# Patient Record
Sex: Female | Born: 1985
Health system: Southern US, Community
[De-identification: ages and names within clinical notes are randomized; demographics above are authoritative.]

## PROBLEM LIST (undated history)

## (undated) ENCOUNTER — Inpatient Hospital Stay (HOSPITAL_COMMUNITY): Payer: Self-pay

## (undated) DIAGNOSIS — O24419 Gestational diabetes mellitus in pregnancy, unspecified control: Secondary | ICD-10-CM

## (undated) DIAGNOSIS — I10 Essential (primary) hypertension: Secondary | ICD-10-CM

## (undated) DIAGNOSIS — F329 Major depressive disorder, single episode, unspecified: Secondary | ICD-10-CM

## (undated) DIAGNOSIS — O9989 Other specified diseases and conditions complicating pregnancy, childbirth and the puerperium: Secondary | ICD-10-CM

## (undated) DIAGNOSIS — O2240 Hemorrhoids in pregnancy, unspecified trimester: Secondary | ICD-10-CM

## (undated) DIAGNOSIS — R945 Abnormal results of liver function studies: Secondary | ICD-10-CM

## (undated) DIAGNOSIS — O99891 Other specified diseases and conditions complicating pregnancy: Secondary | ICD-10-CM

## (undated) DIAGNOSIS — Z8659 Personal history of other mental and behavioral disorders: Secondary | ICD-10-CM

## (undated) HISTORY — DX: Other specified diseases and conditions complicating pregnancy: O99.891

## (undated) HISTORY — DX: Gestational diabetes mellitus in pregnancy, unspecified control: O24.419

## (undated) HISTORY — PX: NO PAST SURGERIES: SHX2092

## (undated) HISTORY — DX: Other specified diseases and conditions complicating pregnancy, childbirth and the puerperium: O99.89

## (undated) HISTORY — DX: Abnormal results of liver function studies: R94.5

## (undated) HISTORY — DX: Hemorrhoids in pregnancy, unspecified trimester: O22.40

## (undated) HISTORY — DX: Essential (primary) hypertension: I10

## (undated) HISTORY — DX: Personal history of other mental and behavioral disorders: Z86.59

---

## 2003-05-21 ENCOUNTER — Emergency Department (HOSPITAL_COMMUNITY): Admission: EM | Admit: 2003-05-21 | Discharge: 2003-05-21 | Payer: Self-pay | Admitting: Emergency Medicine

## 2003-12-27 LAB — HEMOGLOBIN EVAL RFX ELECTROPHORESIS: Hemoglobin Evaluation: NORMAL

## 2003-12-29 ENCOUNTER — Ambulatory Visit (HOSPITAL_COMMUNITY): Admission: RE | Admit: 2003-12-29 | Discharge: 2003-12-29 | Payer: Self-pay | Admitting: *Deleted

## 2004-01-10 ENCOUNTER — Inpatient Hospital Stay (HOSPITAL_COMMUNITY): Admission: AD | Admit: 2004-01-10 | Discharge: 2004-01-10 | Payer: Self-pay | Admitting: *Deleted

## 2004-05-02 ENCOUNTER — Encounter: Admission: RE | Admit: 2004-05-02 | Discharge: 2004-05-02 | Payer: Self-pay | Admitting: *Deleted

## 2004-05-10 ENCOUNTER — Encounter: Admission: RE | Admit: 2004-05-10 | Discharge: 2004-05-10 | Payer: Self-pay | Admitting: *Deleted

## 2004-05-13 ENCOUNTER — Inpatient Hospital Stay (HOSPITAL_COMMUNITY): Admission: AD | Admit: 2004-05-13 | Discharge: 2004-05-16 | Payer: Self-pay | Admitting: Family Medicine

## 2007-02-08 ENCOUNTER — Emergency Department (HOSPITAL_COMMUNITY): Admission: EM | Admit: 2007-02-08 | Discharge: 2007-02-08 | Payer: Self-pay | Admitting: Emergency Medicine

## 2009-04-15 ENCOUNTER — Emergency Department (HOSPITAL_COMMUNITY): Admission: EM | Admit: 2009-04-15 | Discharge: 2009-04-15 | Payer: Self-pay | Admitting: Emergency Medicine

## 2011-02-04 LAB — URINALYSIS, ROUTINE W REFLEX MICROSCOPIC
Nitrite: NEGATIVE
Protein, ur: NEGATIVE mg/dL
pH: 6.5 (ref 5.0–8.0)

## 2011-02-04 LAB — POCT I-STAT, CHEM 8
BUN: 11 mg/dL (ref 6–23)
Calcium, Ion: 1.17 mmol/L (ref 1.12–1.32)
Chloride: 107 mEq/L (ref 96–112)
Glucose, Bld: 98 mg/dL (ref 70–99)
TCO2: 23 mmol/L (ref 0–100)

## 2011-02-04 LAB — URINE MICROSCOPIC-ADD ON

## 2011-02-04 LAB — POCT CARDIAC MARKERS: CKMB, poc: <1 ng/mL — ABNORMAL LOW (ref 1.0–8.0)

## 2011-02-21 ENCOUNTER — Ambulatory Visit
Admission: RE | Admit: 2011-02-21 | Discharge: 2011-02-21 | Disposition: A | Discharge status: Home / Self Care | Payer: No Typology Code available for payment source | Source: Ambulatory Visit | Attending: Geriatric Medicine | Admitting: Geriatric Medicine

## 2011-02-21 ENCOUNTER — Other Ambulatory Visit: Payer: Self-pay | Admitting: Geriatric Medicine

## 2011-02-21 DIAGNOSIS — M79641 Pain in right hand: Secondary | ICD-10-CM

## 2011-09-10 ENCOUNTER — Other Ambulatory Visit (HOSPITAL_COMMUNITY): Payer: Self-pay | Admitting: Family

## 2011-09-10 DIAGNOSIS — Z3689 Encounter for other specified antenatal screening: Secondary | ICD-10-CM

## 2011-09-10 LAB — GC/CHLAMYDIA PROBE AMP, GENITAL
Chlamydia: NEGATIVE
Gonorrhea: NEGATIVE

## 2011-09-10 LAB — CBC: HCT: 33 % — AB (ref 36–46)

## 2011-09-10 LAB — RPR: RPR: NONREACTIVE

## 2011-09-10 LAB — GLUCOSE TOLERANCE, 1 HOUR
GTT, 1 hr: 82
Urine Culture, OB: NO GROWTH

## 2011-09-10 LAB — RUBELLA ANTIBODY, IGM: Rubella: IMMUNE

## 2011-09-10 LAB — HEPATITIS B SURFACE ANTIGEN: Hepatitis B Surface Ag: NEGATIVE

## 2011-09-12 ENCOUNTER — Ambulatory Visit (HOSPITAL_COMMUNITY)
Admission: RE | Admit: 2011-09-12 | Discharge: 2011-09-12 | Disposition: A | Discharge status: Home / Self Care | Payer: Medicaid Other | Source: Ambulatory Visit | Attending: Family | Admitting: Family

## 2011-09-12 ENCOUNTER — Encounter (HOSPITAL_COMMUNITY): Payer: Self-pay

## 2011-09-12 DIAGNOSIS — O358XX0 Maternal care for other (suspected) fetal abnormality and damage, not applicable or unspecified: Secondary | ICD-10-CM | POA: Insufficient documentation

## 2011-09-12 DIAGNOSIS — Z1389 Encounter for screening for other disorder: Secondary | ICD-10-CM | POA: Insufficient documentation

## 2011-09-12 DIAGNOSIS — Z3689 Encounter for other specified antenatal screening: Secondary | ICD-10-CM

## 2011-09-12 DIAGNOSIS — Z363 Encounter for antenatal screening for malformations: Secondary | ICD-10-CM | POA: Insufficient documentation

## 2011-09-12 DIAGNOSIS — O10019 Pre-existing essential hypertension complicating pregnancy, unspecified trimester: Secondary | ICD-10-CM | POA: Insufficient documentation

## 2011-09-12 DIAGNOSIS — O0930 Supervision of pregnancy with insufficient antenatal care, unspecified trimester: Secondary | ICD-10-CM | POA: Insufficient documentation

## 2011-09-13 DIAGNOSIS — I10 Essential (primary) hypertension: Secondary | ICD-10-CM | POA: Insufficient documentation

## 2011-09-16 ENCOUNTER — Other Ambulatory Visit: Payer: Self-pay | Admitting: Obstetrics & Gynecology

## 2011-09-16 ENCOUNTER — Encounter: Payer: Self-pay | Admitting: Family Medicine

## 2011-09-16 ENCOUNTER — Ambulatory Visit (INDEPENDENT_AMBULATORY_CARE_PROVIDER_SITE_OTHER): Payer: Self-pay | Admitting: Family Medicine

## 2011-09-16 DIAGNOSIS — O0990 Supervision of high risk pregnancy, unspecified, unspecified trimester: Secondary | ICD-10-CM | POA: Insufficient documentation

## 2011-09-16 DIAGNOSIS — O169 Unspecified maternal hypertension, unspecified trimester: Secondary | ICD-10-CM

## 2011-09-16 DIAGNOSIS — Z23 Encounter for immunization: Secondary | ICD-10-CM

## 2011-09-16 DIAGNOSIS — O10019 Pre-existing essential hypertension complicating pregnancy, unspecified trimester: Secondary | ICD-10-CM

## 2011-09-16 DIAGNOSIS — I10 Essential (primary) hypertension: Secondary | ICD-10-CM

## 2011-09-16 LAB — POCT URINALYSIS DIP (DEVICE)
Glucose, UA: NEGATIVE mg/dL
Ketones, ur: NEGATIVE mg/dL
Nitrite: NEGATIVE

## 2011-09-16 MED ORDER — METHYLDOPA 500 MG PO TABS: 250.0000 mg | ORAL_TABLET | Freq: Two times a day (BID) | ORAL | Status: DC

## 2011-09-16 MED ORDER — TETANUS-DIPHTH-ACELL PERTUSSIS 5-2.5-18.5 LF-MCG/0.5 IM SUSP
0.5000 mL | Freq: Once | INTRAMUSCULAR | Status: AC
  Administered 2011-09-16: 0.5 mL via INTRAMUSCULAR

## 2011-09-16 MED ORDER — INFLUENZA VIRUS VACC SPLIT PF IM SUSP
0.5000 mL | Freq: Once | INTRAMUSCULAR | Status: AC
  Administered 2011-09-16: 0.5 mL via INTRAMUSCULAR

## 2011-09-16 NOTE — Patient Instructions (Addendum)
Hipertensin en el embarazo (Hypertension in Pregnancy) Hipertensin significa presin arterial elevada. La presin arterial hace circular la sangre por el organismo. En algunos casos, la fuerza que [facility] sangre se hace muy intensa. Durante el [place], este problema debe controlarse con mucha atencin. CUIDADOS EN EL HOGAR   Siga estrictamente las indicaciones del profesional.   Tome slo la medicacin que le indic el mdico.  SOLICITE AYUDA DE INMEDIATO SI:  Siente dolor en el vientre (abdominal).   Observa hinchazn repentina de las manos, tobillos o el rostro.   Aumenta ms de 4 libras (1.8 kilogramos) en una semana.   Vomita repetidas veces.   Tiene hemorragia vaginal.   No siente los movimientos del beb.   Le duele la cabeza.   Tiene visin borrosa o doble.   Tiene tics o espasmos musculares.   Le falta el aire.   Las uas y los labios estn [place].   Observa sangre en la orina.  ASEGRESE DE QUE:  Comprende estas instrucciones.   Controlar su enfermedad.   Solicitar ayuda de inmediato si no mejora o empeora.  Document Released: 01/29/2011 [facility] Patient Information 2012 [place], [place]. Embarazo - Segundo trimestre (Pregnancy - Second Trimester) El segundo trimestre del Psychiatrist (del 3 al ) es un perodo de evolucin rpida para usted y el beb. Hacia el final del sexto mes, el beb mide aproximadamente 23 cm y pesa 680 g. Comenzar a Pharmacologist del beb [place] 18 y las 20 [address] de [place]. Podr sentir las pataditas ("quickening en ingls"). Hay un rpido [employer]. Puede segregar un lquido claro Charity fundraiser) de las [place]. Quizs sienta pequeas contracciones en el vientre (tero) Esto se conoce como falso trabajo de parto o contracciones de Bra[place]. Es como una prctica del trabajo de parto que se produce cuando el beb est listo para salir. Generalmente los problemas de vmitos matinales ya se han superado hacia el  final del Medical sales representative. Algunas mujeres desarrollan pequeas manchas oscuras (que se denominan cloasma, mscara del embarazo) en la cara que normalmente se van luego del nacimiento del beb. La exposicin al sol empeora las manchas. Puede desarrollarse acn en algunas mujeres embarazadas, y puede desaparecer en aquellas que ya tienen acn. EXAMENES PRENATALES  Durante los [employer], deber seguir realizando pruebas de [place], segn avance el [place]. Estas pruebas se realizan para controlar su salud y la del beb. Tambin se realizan anlisis de sangre para [employer] niveles de [place]. La anemia (bajo nivel de hemoglobina) es frecuente durante el embarazo. Para prevenirla, se administran hierro y vitaminas. Tambin se le realizarn exmenes para saber si tiene diabetes entre las 24 y las 28 semanas del [place]. Podrn repetirle algunas de las [employer] hicieron previamente.   En cada visita le medirn el tamao del tero. Esto se realiza para asegurarse de que el beb est creciendo correctamente de acuerdo al estado del [place].   Tambin en cada visita prenatal controlarn su presin arterial. Esto se realiza para asegurarse de que no tenga toxemia.   Se controlar su orina para asegurarse de que no tenga infecciones, diabetes o protena en la orina.   Se controlar su peso regularmente para asegurarse que el aumento ocurre al ritmo indicado. Esto se hace para asegurarse que usted y el beb tienen una evolucin normal.   En algunas ocasiones se realiza una prueba de ultrasonido para confirmar el correcto desarrollo y evolucin del beb. Esta prueba se realiza con ondas sonoras inofensivas para  el beb, de modo que el profesional pueda calcular ms precisamente la fecha del [place].  Algunas veces se realizan pruebas especializadas del lquido amnitico que rodea al beb. Esta prueba se denomina amniocentesis. El lquido amnitico se obtiene introduciendo una aguja en el  vientre (abdomen). Se realiza para Conservator, museum/gallery en los que existe alguna preocupacin acerca de algn problema gentico que pueda sufrir el beb. En ocasiones se lleva a cabo cerca del final del embarazo, si es necesario inducir al [employer]. En este caso se realiza para asegurarse que los pulmones del beb estn lo suficientemente maduros como para que pueda vivir fuera del tero. CAMBIOS QUE OCURREN EN EL SEGUNDO TRIMESTRE DEL EMBARAZO Su organismo atravesar numerosos cambios durante el [employer]. Estos pueden variar de Neomia Dear persona a otra. [place] con el profesional que la asiste acerca los cambios que usted note y que la preocupen.  Durante el segundo trimestre probablemente sienta un aumento del apetito. Es normal tener "antojos" de Development worker, community. Esto vara de Neomia Dear persona a otra y de un embarazo a Therapist, art.   El abdomen inferior comenzar a abultarse.   Podr tener la necesidad de Geographical information systems officer con ms frecuencia debido a que el tero y el beb presionan sobre la vejiga. Tambin es frecuente contraer ms infecciones urinarias durante el embarazo (dolor al [employer]). Puede evitarlas bebiendo gran cantidad de lquidos y vaciando la vejiga antes y despus de Sales promotion account executive.   Podrn aparecer las primeras estras en las caderas, abdomen y [place]. Estos son cambios normales del cuerpo durante el [place]. No existen medicamentos ni ejercicios que puedan prevenir [employer].   Es posible que comience a desarrollar venas inflamadas y abultadas (varices) en las piernas. El uso de medias de descanso, Optometrist sus pies durante 15 minutos, 3 a 4 veces al da y Film/video editor la sal en su dieta ayuda a Journalist, newspaper.   Podr sentir Engineering geologist gstrica a medida que el tero crece y Doctor, general practice. Puede tomar anticidos, con la autorizacin de su mdico, para Financial planner. Tambin es til ingerir pequeas comidas 4 a 5 veces al Futures trader.   La constipacin puede  tratarse con un laxante o agregando fibra a su dieta. Beber grandes cantidades de lquidos, comer vegetales, frutas y granos integrales es de [place].   Tambin es beneficioso practicar actividad fsica. Si ha sido una persona Engineer, mining, podr continuar con la [employer] de las actividades durante el mismo. Si ha sido [employer], puede ser beneficioso que comience con un programa de ejercicios, Museum/gallery exhibitions officer.   Puede desarrollar hemorroides (vrices en el recto) hacia el final del segundo trimestre. Tomar baos de asiento tibios y Chemical engineer cremas recomendadas por el profesional que lo asiste sern de ayuda para los problemas de hemorroides.   Tambin podr Financial risk analyst de espalda durante este momento de su embarazo. Evite levantar objetos pesados, utilice zapatos de taco bajo y [place] buena postura para ayudar a reducir los problemas de [place].   Algunas mujeres embarazadas desarrollan hormigueo y adormecimiento de la mano y los dedos debido a la hinchazn y compresin de los ligamentos de la mueca (sndrome del tnel carpiano). Esto desaparece una vez que el beb nace.   Como sus pechos se agrandan, Pension scheme manager un sujetador ms grande. Use un sostn de soporte, cmodo y de algodn. No utilice un sostn para amamantar hasta el ltimo mes de embarazo si va a amamantar al beb.   Podr  observar una lnea oscura desde el ombligo hacia la zona pbica denominada linea nigra.   Podr observar que sus mejillas se ponen coloradas debido al aumento de flujo sanguneo en la cara.   Podr desarrollar "araitas" en la cara, cuello y pecho. Esto desaparece una vez que el beb nace.  INSTRUCCIONES PARA EL CUIDADO DOMICILIARIO  Es extremadamente importante que evite el cigarrillo, hierbas medicinales, alcohol y las drogas no prescriptas durante el Psychiatrist. Estas sustancias qumicas afectan la formacin y el desarrollo del beb. Evite estas sustancias durante todo el embarazo  para asegurar el nacimiento de un beb sano.   La mayor parte de los cuidados que se aconsejan son los mismos que los indicados para Financial risk analyst trimestre del Psychiatrist. Cumpla con las citas tal como se le indic. Siga las instrucciones del profesional que lo asiste con respecto al uso de los medicamentos, el ejercicio y Psychologist, forensic.   Durante el embarazo debe obtener nutrientes para usted y para su beb. Consuma alimentos balanceados a intervalos regulares. Elija alimentos como carne, pescado, [place] y otros productos lcteos descremados, vegetales, frutas, panes integrales y cereales. El Equities trader cul es el aumento de peso ideal.   Las relaciones sexuales fsicas pueden continuarse hasta cerca del fin del embarazo si no existen otros problemas. Estos problemas pueden ser la prdida temprana (prematura) de lquido amnitico de las [place], sangrado vaginal, dolor abdominal u otros problemas mdicos o del Psychiatrist.   Realice [employer], si no tiene restricciones. Consulte con el profesional que la asiste si no sabe con certeza si determinados ejercicios son seguros. El mayor aumento de peso tiene Environmental consultant durante los ltimos 2 trimestres del Psychiatrist. El ejercicio la ayudar a:   Engineering geologist.   Ponerla en forma para el parto.   Ayudarla a perder peso luego de haber dado a luz.   Use un buen sostn o como los que se usan para hacer deportes para Paramedic la sensibilidad de las [place]. Tambin puede serle til si lo [place] mientras duerme. Si pierde Product manager, podr [employer].   No utilice la baera con agua caliente, baos turcos y saunas durante el [address]   Utilice el c[place]urn de seguridad sin excepcin cuando conduzca. Este la proteger a usted y al beb en caso de accidente.   Evite comer carne cruda, queso crudo, y el contacto con los utensilios y desperdicios de los gatos. Estos elementos contienen grmenes que pueden causar defectos de  nacimiento en el beb.   El segundo trimestre es un buen momento para visitar a su dentista y Software engineer si an no lo ha hecho. Es Primary school teacher los dientes limpios. Utilice un cepillo de dientes blando. Cepllese ms suavemente durante el embarazo.   Es ms fcil perder algo de orina durante el [place]. Apretar y Chief Operating Officer los msculos de la pelvis la ayudar con este problema. Practique detener la miccin cuando est en el bao. Estos son los mismos msculos que Development worker, international aid. Son [employer] mismos msculos que utiliza cuando trata de [employer] gases. Puede practicar apretando estos msculos 10 veces, y repetir esto 3 veces por da aproximadamente. Una vez que conozca qu msculos debe apretar, no realice estos ejercicios durante la miccin. Puede favorecerle una infeccin si la orina vuelve hacia atrs.   Pida ayuda si tiene necesidades econmicas, de asesoramiento o nutricionales durante el [place]. El profesional podr ayudarla con respecto a estas necesidades, o derivarla a otros especialistas.  La piel puede ponerse grasa. Si esto sucede, lvese la cara con un jabn [place], utilice un humectante no graso y [place] con base de aceite o crema.  CONSUMO DE MEDICAMENTOS Y DROGAS DURANTE EL EMBARAZO  Contine tomando las vitaminas apropiadas para esta etapa tal como se le indic. Las vitaminas deben contener un miligramo de cido flico y deben suplementarse con hierro. Guarde todas las vitaminas fuera del alcance de los nios. La ingestin de slo un par de vitaminas o tabletas que contengan hierro puede ocasionar la [employer] en un beb o en un nio pequeo.   Evite el uso de [place], inclusive los de venta libre y hierbas que no hayan sido prescriptos o indicados por el profesional que la asiste. Algunos medicamentos pueden causar problemas fsicos al beb. Utilice los medicamentos de venta libre o de prescripcin para Chief Technology Officer, Environmental health practitioner o la [place], segn se  lo indique el profesional que lo asiste. No utilice aspirina.   El consumo de alcohol est relacionado con ciertos defectos de nacimiento. Esto incluye el sndrome de alcoholismo fetal. Debe evitar el consumo de alcohol en cualquiera de sus formas. El cigarrillo causa nacimientos prematuros y bebs de [place]. El uso de drogas recreativas est absolutamente prohibido. Son muy nocivas para el beb. Un beb que nace de [employer], ser adicto al nacer. Ese beb tendr los mismos sntomas de abstinencia que un adulto.   Infrmele al profesional si consume alguna droga.   No consuma drogas ilegales. Pueden causarle mucho dao al beb.  SOLICITE ATENCIN MDICA SI: Tiene preguntas o preocupaciones durante su embarazo. Es mejor que llame para Science writer las dudas que esperar hasta su prxima visita prenatal. Thressa Sheller forma se sentir ms tranquila.  SOLICITE ATENCIN MDICA DE INMEDIATO SI:  La temperatura oral se eleva sin motivo por encima de 102 F (38.9 C) o segn le indique el profesional que lo asiste.   Tiene una prdida de lquido por la vagina (canal de parto). Si sospecha una ruptura de las [place], tmese la temperatura y llame al profesional para informarlo sobre esto.   Observa unas pequeas manchas, una hemorragia vaginal o elimina cogulos. Notifique al profesional acerca de la cantidad y de cuntos apsitos est utilizando. Unas pequeas manchas de sangre son algo comn durante el Psychiatrist, especialmente despus de Sales promotion account executive.   Presenta un olor desagradable en la secrecin vaginal y observa un cambio en el color, de transparente a blanco.   Contina con las nuseas y no obtiene alivio de los remedios indicados. Vomita sangre o algo similar a la borra del caf.   [place] o sube ms de 900 g. en una semana, o segn lo indicado por el profesional que la asiste.   Observa que se le [employer], las manos, los pies o las piernas.   Ha estado expuesta a la  rubola y no ha sufrido la enfermedad.   Ha estado expuesta a la quinta enfermedad o a la varicela.   Presenta dolor abdominal. Las molestias en el ligamento redondo son Neomia Dear causa no cancerosa (benigna) frecuente de dolor abdominal durante el embarazo. El profesional que la asiste deber evaluarla.   Presenta dolor de cabeza intenso que no se [place].   Presenta fiebre, diarrea, dolor al orinar o le falta la respiracin.   Presenta dificultad para ver, visin borrosa, o visin doble.   Sufre una cada, un accidente de trnsito o cualquier tipo de trauma.   Vive en un hogar en  el que existe violencia fsica o mental.  Document Released: 07/24/2005 Document Revised: 06/26/2011 [facility] Patient Information 2012 [place], [place].

## 2011-09-16 NOTE — Progress Notes (Signed)
Baseline labs

## 2011-09-16 NOTE — Progress Notes (Signed)
Swelling in feet. No vaginal discharge. Pulse 95. Pt signed consent for flu and Tdap vaccine. Used interpreter Delorise Royals.

## 2011-09-22 LAB — LAB REPORT - SCANNED
Hepatitis B Surface Antigen: NEGATIVE
Rubella IgG Scr: IMMUNE

## 2011-09-30 ENCOUNTER — Ambulatory Visit (INDEPENDENT_AMBULATORY_CARE_PROVIDER_SITE_OTHER): Payer: Self-pay | Admitting: Family Medicine

## 2011-09-30 ENCOUNTER — Other Ambulatory Visit: Payer: Self-pay | Admitting: Family Medicine

## 2011-09-30 DIAGNOSIS — O0990 Supervision of high risk pregnancy, unspecified, unspecified trimester: Secondary | ICD-10-CM

## 2011-09-30 DIAGNOSIS — O10019 Pre-existing essential hypertension complicating pregnancy, unspecified trimester: Secondary | ICD-10-CM

## 2011-09-30 LAB — CBC
Hemoglobin: 10.4 g/dL — ABNORMAL LOW (ref 12.0–15.0)
MCHC: 32.2 g/dL (ref 30.0–36.0)
MCV: 93.9 fL (ref 78.0–100.0)
Platelets: 202 10*3/uL (ref 150–400)
RDW: 14.1 % (ref 11.5–15.5)

## 2011-09-30 LAB — COMPREHENSIVE METABOLIC PANEL
ALT: 122 U/L — ABNORMAL HIGH (ref 0–35)
AST: 69 U/L — ABNORMAL HIGH (ref 0–37)
Alkaline Phosphatase: 74 U/L (ref 39–117)
BUN: 7 mg/dL (ref 6–23)
CO2: 23 mEq/L (ref 19–32)
Chloride: 105 mEq/L (ref 96–112)
Potassium: 4.3 mEq/L (ref 3.5–5.3)
Total Protein: 6.5 g/dL (ref 6.0–8.3)

## 2011-09-30 LAB — POCT URINALYSIS DIP (DEVICE)
Bilirubin Urine: NEGATIVE
Specific Gravity, Urine: 1.02 (ref 1.005–1.030)

## 2011-09-30 NOTE — Patient Instructions (Addendum)
Hipertensin en el embarazo (Hypertension in Pregnancy) Hipertensin significa presin arterial elevada. La presin arterial hace circular la sangre por el organismo. En algunos casos, la fuerza que moviliza la sangre se hace muy intensa. Durante el embarazo, este problema debe controlarse con mucha atencin. CUIDADOS EN EL HOGAR   Siga estrictamente las indicaciones del profesional.   Tome slo la medicacin que le indic el mdico.  SOLICITE AYUDA DE INMEDIATO SI:  Siente dolor en el vientre (abdominal).   Observa hinchazn repentina de las manos, tobillos o el rostro.   Aumenta ms de 4 libras (1.8 kilogramos) en una semana.   Vomita repetidas veces.   Tiene hemorragia vaginal.   No siente los movimientos del beb.   Le duele la cabeza.   Tiene visin borrosa o doble.   Tiene tics o espasmos musculares.   Le falta el aire.   Las uas y los labios estn azules.   Observa sangre en la orina.  ASEGRESE DE QUE:  Comprende estas instrucciones.   Controlar su enfermedad.   Solicitar ayuda de inmediato si no mejora o empeora.  Document Released: 01/29/2011 [employer] Patient Information 2012 [employer]. Embarazo - Segundo trimestre (Pregnancy - Second Trimester) El segundo trimestre del embarazo (del 3 al 6mes) es un perodo de evolucin rpida para usted y el beb. Hacia el final del sexto mes, el beb mide aproximadamente 23 cm y pesa 680 g. Comenzar a sentir los movimientos del beb entre las 18 y las 20 semanas de embarazo. Podr sentir las pataditas ("quickening en ingls"). Hay un rpido aumento de peso. Puede segregar un lquido claro (calostro) de las mamas. Quizs sienta pequeas contracciones en el vientre (tero) Esto se conoce como falso trabajo de parto o contracciones de Braxton-Hicks. Es como una prctica del trabajo de parto que se produce cuando el beb est listo para salir. Generalmente los problemas de vmitos matinales ya se han superado hacia el  final del primer trimestre. Algunas mujeres desarrollan pequeas manchas oscuras (que se denominan cloasma, mscara del embarazo) en la cara que normalmente se van luego del nacimiento del beb. La exposicin al sol empeora las manchas. Puede desarrollarse acn en algunas mujeres embarazadas, y puede desaparecer en aquellas que ya tienen acn. EXAMENES PRENATALES  Durante los exmenes prenatales, deber seguir realizando pruebas de sangre, segn avance el embarazo. Estas pruebas se realizan para controlar su salud y la del beb. Tambin se realizan anlisis de sangre para conocer los niveles de hemoglobina. La anemia (bajo nivel de hemoglobina) es frecuente durante el embarazo. Para prevenirla, se administran hierro y vitaminas. Tambin se le realizarn exmenes para saber si tiene diabetes entre las 24 y las 28 semanas del embarazo. Podrn repetirle algunas de las pruebas que le hicieron previamente.   En cada visita le medirn el tamao del tero. Esto se realiza para asegurarse de que el beb est creciendo correctamente de acuerdo al estado del embarazo.   Tambin en cada visita prenatal controlarn su presin arterial. Esto se realiza para asegurarse de que no tenga toxemia.   Se controlar su orina para asegurarse de que no tenga infecciones, diabetes o protena en la orina.   Se controlar su peso regularmente para asegurarse que el aumento ocurre al ritmo indicado. Esto se hace para asegurarse que usted y el beb tienen una evolucin normal.   En algunas ocasiones se realiza una prueba de ultrasonido para confirmar el correcto desarrollo y evolucin del beb. Esta prueba se realiza con ondas sonoras inofensivas para   el beb, de modo que el profesional pueda calcular ms precisamente la fecha del parto.  Algunas veces se realizan pruebas especializadas del lquido amnitico que rodea al beb. Esta prueba se denomina amniocentesis. El lquido amnitico se obtiene introduciendo una aguja en el  vientre (abdomen). Se realiza para controlar los cromosomas en aquellos casos en los que existe alguna preocupacin acerca de algn problema gentico que pueda sufrir el beb. En ocasiones se lleva a cabo cerca del final del embarazo, si es necesario inducir al parto. En este caso se realiza para asegurarse que los pulmones del beb estn lo suficientemente maduros como para que pueda vivir fuera del tero. CAMBIOS QUE OCURREN EN EL SEGUNDO TRIMESTRE DEL EMBARAZO Su organismo atravesar numerosos cambios durante el embarazo. Estos pueden variar de una persona a otra. Converse con el profesional que la asiste acerca los cambios que usted note y que la preocupen.  Durante el segundo trimestre probablemente sienta un aumento del apetito. Es normal tener "antojos" de ciertas comidas. Esto vara de una persona a otra y de un embarazo a otro.   El abdomen inferior comenzar a abultarse.   Podr tener la necesidad de orinar con ms frecuencia debido a que el tero y el beb presionan sobre la vejiga. Tambin es frecuente contraer ms infecciones urinarias durante el embarazo (dolor al orinar). Puede evitarlas bebiendo gran cantidad de lquidos y vaciando la vejiga antes y despus de mantener relaciones sexuales.   Podrn aparecer las primeras estras en las caderas, abdomen y mamas. Estos son cambios normales del cuerpo durante el embarazo. No existen medicamentos ni ejercicios que puedan prevenir estos cambios.   Es posible que comience a desarrollar venas inflamadas y abultadas (varices) en las piernas. El uso de medias de descanso, elevar sus pies durante 15 minutos, 3 a 4 veces al da y limitar la sal en su dieta ayuda a aliviar el problema.   Podr sentir acidez gstrica a medida que el tero crece y presiona contra el estmago. Puede tomar anticidos, con la autorizacin de su mdico, para aliviar este problema. Tambin es til ingerir pequeas comidas 4 a 5 veces al da.   La constipacin puede  tratarse con un laxante o agregando fibra a su dieta. Beber grandes cantidades de lquidos, comer vegetales, frutas y granos integrales es de gran ayuda.   Tambin es beneficioso practicar actividad fsica. Si ha sido una persona activa hasta el embarazo, podr continuar con la mayora de las actividades durante el mismo. Si ha sido menos activa, puede ser beneficioso que comience con un programa de ejercicios, como realizar caminatas.   Puede desarrollar hemorroides (vrices en el recto) hacia el final del segundo trimestre. Tomar baos de asiento tibios y utilizar cremas recomendadas por el profesional que lo asiste sern de ayuda para los problemas de hemorroides.   Tambin podr sentir dolor de espalda durante este momento de su embarazo. Evite levantar objetos pesados, utilice zapatos de taco bajo y mantenga una buena postura para ayudar a reducir los problemas de espalda.   Algunas mujeres embarazadas desarrollan hormigueo y adormecimiento de la mano y los dedos debido a la hinchazn y compresin de los ligamentos de la mueca (sndrome del tnel carpiano). Esto desaparece una vez que el beb nace.   Como sus pechos se agrandan, necesitar un sujetador ms grande. Use un sostn de soporte, cmodo y de algodn. No utilice un sostn para amamantar hasta el ltimo mes de embarazo si va a amamantar al beb.   Podr   observar una lnea oscura desde el ombligo hacia la zona pbica denominada linea nigra.   Podr observar que sus mejillas se ponen coloradas debido al aumento de flujo sanguneo en la cara.   Podr desarrollar "araitas" en la cara, cuello y pecho. Esto desaparece una vez que el beb nace.  INSTRUCCIONES PARA EL CUIDADO DOMICILIARIO  Es extremadamente importante que evite el cigarrillo, hierbas medicinales, alcohol y las drogas no prescriptas durante el embarazo. Estas sustancias qumicas afectan la formacin y el desarrollo del beb. Evite estas sustancias durante todo el embarazo  para asegurar el nacimiento de un beb sano.   La mayor parte de los cuidados que se aconsejan son los mismos que los indicados para el primer trimestre del embarazo. Cumpla con las citas tal como se le indic. Siga las instrucciones del profesional que lo asiste con respecto al uso de los medicamentos, el ejercicio y la dieta.   Durante el embarazo debe obtener nutrientes para usted y para su beb. Consuma alimentos balanceados a intervalos regulares. Elija alimentos como carne, pescado, leche y otros productos lcteos descremados, vegetales, frutas, panes integrales y cereales. El profesional le informar cul es el aumento de peso ideal.   Las relaciones sexuales fsicas pueden continuarse hasta cerca del fin del embarazo si no existen otros problemas. Estos problemas pueden ser la prdida temprana (prematura) de lquido amnitico de las membranas, sangrado vaginal, dolor abdominal u otros problemas mdicos o del embarazo.   Realice actividad fsica todos los das, si no tiene restricciones. Consulte con el profesional que la asiste si no sabe con certeza si determinados ejercicios son seguros. El mayor aumento de peso tiene lugar durante los ltimos 2 trimestres del embarazo. El ejercicio la ayudar a:   Controlar su peso.   Ponerla en forma para el parto.   Ayudarla a perder peso luego de haber dado a luz.   Use un buen sostn o como los que se usan para hacer deportes para aliviar la sensibilidad de las mamas. Tambin puede serle til si lo usa mientras duerme. Si pierde calostro, podr utilizar apsitos en el sostn.   No utilice la baera con agua caliente, baos turcos y saunas durante el embarazo.   Utilice el cinturn de seguridad sin excepcin cuando conduzca. Este la proteger a usted y al beb en caso de accidente.   Evite comer carne cruda, queso crudo, y el contacto con los utensilios y desperdicios de los gatos. Estos elementos contienen grmenes que pueden causar defectos de  nacimiento en el beb.   El segundo trimestre es un buen momento para visitar a su dentista y evaluar su salud dental si an no lo ha hecho. Es importante mantener los dientes limpios. Utilice un cepillo de dientes blando. Cepllese ms suavemente durante el embarazo.   Es ms fcil perder algo de orina durante el embarazo. Apretar y fortalecer los msculos de la pelvis la ayudar con este problema. Practique detener la miccin cuando est en el bao. Estos son los mismos msculos que necesita fortalecer. Son tambin los mismos msculos que utiliza cuando trata de evitar los gases. Puede practicar apretando estos msculos 10 veces, y repetir esto 3 veces por da aproximadamente. Una vez que conozca qu msculos debe apretar, no realice estos ejercicios durante la miccin. Puede favorecerle una infeccin si la orina vuelve hacia atrs.   Pida ayuda si tiene necesidades econmicas, de asesoramiento o nutricionales durante el embarazo. El profesional podr ayudarla con respecto a estas necesidades, o derivarla a otros especialistas.     La piel puede ponerse grasa. Si esto sucede, lvese la cara con un jabn suave, utilice un humectante no graso y maquillaje con base de aceite o crema.  CONSUMO DE MEDICAMENTOS Y DROGAS DURANTE EL EMBARAZO  Contine tomando las vitaminas apropiadas para esta etapa tal como se le indic. Las vitaminas deben contener un miligramo de cido flico y deben suplementarse con hierro. Guarde todas las vitaminas fuera del alcance de los nios. La ingestin de slo un par de vitaminas o tabletas que contengan hierro puede ocasionar la muerte en un beb o en un nio pequeo.   Evite el uso de medicamentos, inclusive los de venta libre y hierbas que no hayan sido prescriptos o indicados por el profesional que la asiste. Algunos medicamentos pueden causar problemas fsicos al beb. Utilice los medicamentos de venta libre o de prescripcin para el dolor, el malestar o la fiebre, segn se  lo indique el profesional que lo asiste. No utilice aspirina.   El consumo de alcohol est relacionado con ciertos defectos de nacimiento. Esto incluye el sndrome de alcoholismo fetal. Debe evitar el consumo de alcohol en cualquiera de sus formas. El cigarrillo causa nacimientos prematuros y bebs de bajo peso. El uso de drogas recreativas est absolutamente prohibido. Son muy nocivas para el beb. Un beb que nace de una madre adicta, ser adicto al nacer. Ese beb tendr los mismos sntomas de abstinencia que un adulto.   Infrmele al profesional si consume alguna droga.   No consuma drogas ilegales. Pueden causarle mucho dao al beb.  SOLICITE ATENCIN MDICA SI: Tiene preguntas o preocupaciones durante su embarazo. Es mejor que llame para consultar las dudas que esperar hasta su prxima visita prenatal. De esta forma se sentir ms tranquila.  SOLICITE ATENCIN MDICA DE INMEDIATO SI:  La temperatura oral se eleva sin motivo por encima de 102 F (38.9 C) o segn le indique el profesional que lo asiste.   Tiene una prdida de lquido por la vagina (canal de parto). Si sospecha una ruptura de las membranas, tmese la temperatura y llame al profesional para informarlo sobre esto.   Observa unas pequeas manchas, una hemorragia vaginal o elimina cogulos. Notifique al profesional acerca de la cantidad y de cuntos apsitos est utilizando. Unas pequeas manchas de sangre son algo comn durante el embarazo, especialmente despus de mantener relaciones sexuales.   Presenta un olor desagradable en la secrecin vaginal y observa un cambio en el color, de transparente a blanco.   Contina con las nuseas y no obtiene alivio de los remedios indicados. Vomita sangre o algo similar a la borra del caf.   [place] o sube ms de 900 g. en una semana, o segn lo indicado por el profesional que la asiste.   Observa que se le hinchan el rostro, las manos, los pies o las piernas.   Ha estado expuesta a la  rubola y no ha sufrido la enfermedad.   Ha estado expuesta a la quinta enfermedad o a la varicela.   Presenta dolor abdominal. Las molestias en el ligamento redondo son una causa no cancerosa (benigna) frecuente de dolor abdominal durante el embarazo. El profesional que la asiste deber evaluarla.   Presenta dolor de cabeza intenso que no se alivia.   Presenta fiebre, diarrea, dolor al orinar o le falta la respiracin.   Presenta dificultad para ver, visin borrosa, o visin doble.   Sufre una cada, un accidente de trnsito o cualquier tipo de trauma.   Vive en un hogar en   el que existe violencia fsica o mental.  Document Released: 07/24/2005 Document Revised: 06/26/2011 [employer] Patient Information 2012 [employer]. 

## 2011-09-30 NOTE — Progress Notes (Signed)
Baseline labs today

## 2011-10-01 LAB — CREATININE CLEARANCE, URINE, 24 HOUR: Creatinine, 24H Ur: 1231 mg/d (ref 700–1800)

## 2011-10-01 LAB — PROTEIN, URINE, 24 HOUR

## 2011-10-02 ENCOUNTER — Encounter: Payer: Self-pay | Admitting: Family Medicine

## 2011-10-02 DIAGNOSIS — R7989 Other specified abnormal findings of blood chemistry: Secondary | ICD-10-CM

## 2011-10-02 DIAGNOSIS — R945 Abnormal results of liver function studies: Secondary | ICD-10-CM | POA: Insufficient documentation

## 2011-10-02 HISTORY — DX: Abnormal results of liver function studies: R94.5

## 2011-10-02 HISTORY — DX: Other specified abnormal findings of blood chemistry: R79.89

## 2011-10-14 ENCOUNTER — Ambulatory Visit (INDEPENDENT_AMBULATORY_CARE_PROVIDER_SITE_OTHER): Payer: Medicaid Other | Admitting: Obstetrics and Gynecology

## 2011-10-14 ENCOUNTER — Other Ambulatory Visit: Payer: Self-pay | Admitting: Obstetrics and Gynecology

## 2011-10-14 DIAGNOSIS — O10019 Pre-existing essential hypertension complicating pregnancy, unspecified trimester: Secondary | ICD-10-CM

## 2011-10-14 DIAGNOSIS — O0990 Supervision of high risk pregnancy, unspecified, unspecified trimester: Secondary | ICD-10-CM

## 2011-10-14 LAB — POCT URINALYSIS DIP (DEVICE)
Protein, ur: NEGATIVE mg/dL
Urobilinogen, UA: 0.2 mg/dL (ref 0.0–1.0)
pH: 7 (ref 5.0–8.0)

## 2011-10-14 NOTE — Progress Notes (Signed)
Pain in calves on both legs.

## 2011-10-14 NOTE — Progress Notes (Signed)
Patient doing well report, reports b/l calf pain worst with ambulation. On exam LE equal in size, no edema, no palpable cord, no pain. Patient advised to wear supporting stockings and to elevate lower extremities when sitting. FM/PTL precautions reviewed

## 2011-10-24 ENCOUNTER — Ambulatory Visit (INDEPENDENT_AMBULATORY_CARE_PROVIDER_SITE_OTHER): Payer: Medicaid Other | Admitting: Obstetrics & Gynecology

## 2011-10-24 ENCOUNTER — Other Ambulatory Visit: Payer: Self-pay | Admitting: Obstetrics & Gynecology

## 2011-10-24 VITALS — BP 121/86 | Temp 98.0°F | Wt 181.5 lb

## 2011-10-24 DIAGNOSIS — O10019 Pre-existing essential hypertension complicating pregnancy, unspecified trimester: Secondary | ICD-10-CM

## 2011-10-24 LAB — POCT URINALYSIS DIP (DEVICE)
Glucose, UA: NEGATIVE mg/dL
Hgb urine dipstick: NEGATIVE
Nitrite: NEGATIVE
Protein, ur: NEGATIVE mg/dL
Specific Gravity, Urine: >=1.03 (ref 1.005–1.030)
Urobilinogen, UA: 0.2 mg/dL (ref 0.0–1.0)
pH: 6.5 (ref 5.0–8.0)

## 2011-10-24 NOTE — Progress Notes (Signed)
Addended by: Sherre Lain A on: 10/24/2011 12:15 PM   Modules accepted: Orders

## 2011-10-24 NOTE — Progress Notes (Signed)
No complaints, taking her aldomet Schedule growth [place] at 28 weeks

## 2011-10-24 NOTE — Progress Notes (Signed)
1hr gtt today. Due at 1040

## 2011-10-29 NOTE — L&D Delivery Note (Signed)
Delivery Note At 11:39 a viable female was delivered via  (Presentation: ;  ).  APGAR: , ; weight .   Placenta status: , .  Cord:  with the following complications: none Anesthesia:  nubain Episiotomy: n/a Lacerations: none Est. Blood Loss (mL): 200cc  Mom to postpartum.  Baby to nursery-stable.  Witt Plitt 01/12/2012, 12:49 PM

## 2011-10-31 ENCOUNTER — Ambulatory Visit (HOSPITAL_COMMUNITY)
Admission: RE | Admit: 2011-10-31 | Discharge: 2011-10-31 | Disposition: A | Discharge status: Home / Self Care | Payer: Medicaid Other | Source: Ambulatory Visit | Attending: Obstetrics & Gynecology | Admitting: Obstetrics & Gynecology

## 2011-10-31 DIAGNOSIS — O10019 Pre-existing essential hypertension complicating pregnancy, unspecified trimester: Secondary | ICD-10-CM | POA: Insufficient documentation

## 2011-10-31 DIAGNOSIS — Z3689 Encounter for other specified antenatal screening: Secondary | ICD-10-CM | POA: Insufficient documentation

## 2011-11-04 ENCOUNTER — Ambulatory Visit (INDEPENDENT_AMBULATORY_CARE_PROVIDER_SITE_OTHER): Payer: Medicaid Other | Admitting: Obstetrics & Gynecology

## 2011-11-04 ENCOUNTER — Other Ambulatory Visit: Payer: Self-pay | Admitting: Obstetrics & Gynecology

## 2011-11-04 VITALS — Temp 97.0°F | Wt 182.7 lb

## 2011-11-04 DIAGNOSIS — O358XX0 Maternal care for other (suspected) fetal abnormality and damage, not applicable or unspecified: Secondary | ICD-10-CM

## 2011-11-04 DIAGNOSIS — O0990 Supervision of high risk pregnancy, unspecified, unspecified trimester: Secondary | ICD-10-CM

## 2011-11-04 DIAGNOSIS — O359XX0 Maternal care for (suspected) fetal abnormality and damage, unspecified, not applicable or unspecified: Secondary | ICD-10-CM | POA: Insufficient documentation

## 2011-11-04 LAB — POCT URINALYSIS DIP (DEVICE)
Glucose, UA: NEGATIVE mg/dL
Hgb urine dipstick: NEGATIVE
Specific Gravity, Urine: 1.02 (ref 1.005–1.030)
Urobilinogen, UA: 1 mg/dL (ref 0.0–1.0)
pH: 7 (ref 5.0–8.0)

## 2011-11-04 NOTE — Progress Notes (Signed)
Bilateral fetal pyelectasis--f/u US at 34 weeks.

## 2011-11-04 NOTE — Progress Notes (Signed)
Pulse- 87 

## 2011-11-04 NOTE — Patient Instructions (Signed)
Amamantar al beb (Breastfeeding) LOS BENEFICIOS DE AMAMANTAR Para el beb  La primera leche (calostro ) ayuda al mejor funcionamiento del sistema digestivo del beb.   La leche tiene anticuerpos que provienen de la madre y que ayudan a prevenir las infecciones en el beb.   Hay una menor incidencia de asma, enfermedades alrgicas y SMSI (sndrome de muerte sbita nfantil).   Los nutrientes que contiene la leche materna son mejores que las frmulas para el bibern y favorecen el desarrollo cerebral.   Los bebs amamantados sufren menos gases, clicos y constipacin.  Para la mam  La lactancia materna favorece el desarrollo de un vnculo muy especial entre la madre y el beb.   Es ms conveniente, siempre disponible a la temperatura adecuada y ms econmica que la leche maternizada.   Consume caloras en la madre y la ayuda a perder el peso ganado durante el embarazo.   Favorece la contraccin del tero a su tamao normal, de manera ms rpida y disminuye las hemorragias luego del parto.   Las madres que amamantan tienen menor riesgo de desarrollar cncer de mama.  AMAMNTELO CON FRECUENCIA  Un beb sano, nacido a trmino, puede amamantarse con tanta frecuencia como cada hora, o espaciar las comidas cada tres horas.   Esta frecuencia variar de un beb a otro. Observe al beb cuando manifieste signos de hambre, antes que regirse por el reloj.   Amamntelo tan seguido como el beb lo solicite, o cuando usted sienta la necesidad de aliviar sus mamas.   Despierte al beb si han pasado 3  4 horas desde la ltima comida.   El amamantamiento frecuente la ayudar a producir ms leche y a prevenir problemas de dolor en los pezones e hinchazn de las mamas.  LA POSICIN DEL BEB PARA AMAMANTARLO  Ya sea que se encuentre acostada o sentada, asegrese que el abdomen del beb enfrente el suyo.   Sostenga la mama con el pulgar por arriba y el resto de los dedos por debajo. Asegrese que  sus dedos se encuentren lejos del pezn y de la boca del beb.   Toque suavemente los labios del beb y la mejilla ms cercana a la mama con el dedo o el pezn.   Cuando la boca del beb se abra lo suficiente, introduzca el pezn y la zona oscura que lo rodea tanto como le sea posible dentro de la boca.   Coloque a beb cerca suyo de modo que su nariz y mejillas toquen las mamas al mamar.  LAS COMIDAS  La duracin de cada comida vara de un beb a otro y de una comida a otra.   El beb debe succionar alrededor de dos o tres minutos para que le llegue leche. Esto se denomina "bajada". Por este motivo, permita que el nio se alimente en cada mama todo lo que desee. Terminar de mamar cuando haya recibido la cantidad adecuada de nutrientes.   Para detener la succin coloque su dedo en la comisura de la boca del nio y deslcelo entre sus encas antes de quitarle la mama de la boca. Esto la ayudar a evitar el dolor en los pezones.  REDUCIR LA CONGESTIN DE LAS MAMAS  Durante la primera semana despus del parto, usted puede experimentar congestin en las mamas. Cuando las mamas estn congestionadas, se sienten calientes, llenas y molestas al tacto. Puede reducir la congestin si:   Lo amamanta frecuentemente, cada 2-3 horas. Las mams que amamantan pronto y con frecuencia tienen menos problemas   de congestin.   Coloque bolsas fras livianas entre cada mamada. Esto ayuda a reducir la hinchazn. Envuelva las bolsas de hielo en una toalla liviana para proteger su piel.   Aplique compresas hmedas calientes sobre la mama durante 5 a 10 minutos antes de amamantar al nio. Esto aumenta la circulacin y ayuda a que la leche fluya.   Masajee suavemente la mama antes y durante la alimentacin.   Asegrese que el nio vaca al menos una mama antes de cambiar de lado.   Use un sacaleche para vaciar la mama si el beb se duerme o no se alimenta bien. Tambin podr quitarse la leche con esta bomba si  tiene que volver al trabajo o siente que las mamas estn congestionadas.   Evite los biberones, chupetes o complementar la alimentacin con agua o jugos en lugar de la leche materna.   Verifique que el beb se encuentra en la posicin correcta mientras lo alimenta.   Evite el cansancio, el estrs y la anemia   Use un soutien que sostenga bien sus mamas y evite los que tienen aro.   Consuma una dieta balanceada y beba lquidos en cantidad.  Si sigue estas indicaciones, la congestin debe mejorar en 24 a 48 horas. Si an tiene dificultades, consulte a su asesor en lactancia. TENDR SUFICIENTE LECHE MI BEB? Algunas veces las madres se preocupan acerca de si sus bebs tendrn la leche suficiente. Puede asegurarse que el beb tiene la leche suficiente si:  El beb succiona y escucha que traga activamente.   El nio se alimenta al menos 8 a 12 veces en 24 horas. Alimntelo hasta que se desprenda por sus propios medios o se quede dormido en la primera mama (al menos durante 10 a 20 minutos), luego ofrzcale el otro lado.   El beb moja 5 a 6 paales descartables (6 a 8 paales de tela) en 24 horas cuando tiene 5  6 das de vida.   Tiene al menos 2-3 deposiciones todos los das en los primeros meses. La leche materna es todo el alimento que el beb necesita. No es necesario que el nio ingiera agua o preparados de bibern. De hecho, para ayudar a que sus mamas produzcan ms leche, lo mejor es no darle al beb suplementos durante las primeras semanas.   La materia fecal debe ser blanda y amarillenta.   El beb debe aumentar 112 a 196 g por semana.  CUDESE Cuide sus mamas del siguiente modo:  Bese o dchese diariamente.   No lave sus pezones con jabn.   Comience a amamantar del lado izquierdo en una comida y del lado derecho en la siguiente.   Notar que aumenta el suministro de leche a los 2 a 5 das despus del parto. Puede sentir algunas molestias por la congestin, lo que hace que  sus mamas estn duras y sensibles. La congestin disminuye en 24 a 48 horas. Mientras tanto, aplique toallas hmedas calientes durante 5 a 10 minutos antes de amamantar. Un masaje suave y la extraccin de un poco de leche antes de amamantar ablandarn las mamas y har ms fcil que el beb se agarre. Use un buen sostn y seque al aire los pezones durante 10 a 15 minutos luego de cada alimentacin.   Solo utilice apsitos de algodn.   Utilice lanolina pura sobre los pezones luego de amamantar. No necesita lavarlos luego de alimentar al nio.  Cudese del siguiente modo:   Consuma alimentos bien balanceados y refrigerios nutritivos.     Beba leche, jugos de fruta y agua para satisfacer la sed (alrededor de 8 vasos por da).   Descanse lo suficiente.   Aumente la ingesta de calcio en la dieta (1200mg/da).   Evite los alimentos que usted nota que puedan afectar al beb.  SOLICITE ATENCIN MDICA SI:  Tiene preguntas que formular o dificultades con la alimentacin a pecho.   Necesita ayuda.   Observa una zona dura, roja y que le duele en la zona de la mama, y se acompaa de fiebre de 100.5 F (38.1 C) o ms.   El beb est muy somnoliento como para alimentarse bien o tiene problemas para dormir.   El beb moja menos de 6 paales por da, a partir de los 5 das de vida.   La piel del beb o la parte blanca de sus ojos est ms amarilla de lo que estaba en el hospital.   Se siente deprimida.  Document Released: 10/14/2005 Document Revised: 06/26/2011 [employer] Patient Information 2012 [employer]. 

## 2011-11-04 NOTE — Progress Notes (Signed)
U/S scheduled 12/16/11 at 915 am.

## 2011-11-18 ENCOUNTER — Ambulatory Visit (INDEPENDENT_AMBULATORY_CARE_PROVIDER_SITE_OTHER): Payer: Self-pay | Admitting: Obstetrics and Gynecology

## 2011-11-18 DIAGNOSIS — O0990 Supervision of high risk pregnancy, unspecified, unspecified trimester: Secondary | ICD-10-CM

## 2011-11-18 DIAGNOSIS — R945 Abnormal results of liver function studies: Secondary | ICD-10-CM

## 2011-11-18 DIAGNOSIS — O359XX0 Maternal care for (suspected) fetal abnormality and damage, unspecified, not applicable or unspecified: Secondary | ICD-10-CM

## 2011-11-18 DIAGNOSIS — R7989 Other specified abnormal findings of blood chemistry: Secondary | ICD-10-CM

## 2011-11-18 DIAGNOSIS — I10 Essential (primary) hypertension: Secondary | ICD-10-CM

## 2011-11-18 DIAGNOSIS — O358XX0 Maternal care for other (suspected) fetal abnormality and damage, not applicable or unspecified: Secondary | ICD-10-CM

## 2011-11-18 LAB — POCT URINALYSIS DIP (DEVICE)
Hgb urine dipstick: NEGATIVE
Protein, ur: NEGATIVE mg/dL
Specific Gravity, Urine: 1.02 (ref 1.005–1.030)
Urobilinogen, UA: 0.2 mg/dL (ref 0.0–1.0)
pH: 6.5 (ref 5.0–8.0)

## 2011-11-18 NOTE — Progress Notes (Signed)
Having pain from umbilicus down towards pelvis

## 2011-11-18 NOTE — Progress Notes (Signed)
Patient doing well without complaints. F/u ultrasound appointment scheduled for 2/18. Will repeat LFTs today

## 2011-11-19 LAB — COMPREHENSIVE METABOLIC PANEL
Albumin: 3.4 g/dL — ABNORMAL LOW (ref 3.5–5.2)
Alkaline Phosphatase: 101 U/L (ref 39–117)
BUN: 6 mg/dL (ref 6–23)
Creat: 0.49 mg/dL — ABNORMAL LOW (ref 0.50–1.10)
Glucose, Bld: 83 mg/dL (ref 70–99)
Potassium: 3.8 mEq/L (ref 3.5–5.3)
Total Bilirubin: 0.2 mg/dL — ABNORMAL LOW (ref 0.3–1.2)

## 2011-11-23 ENCOUNTER — Inpatient Hospital Stay (HOSPITAL_COMMUNITY)
Admission: AD | Admit: 2011-11-23 | Discharge: 2011-11-23 | Disposition: A | Discharge status: Home / Self Care | Payer: Self-pay | Source: Ambulatory Visit | Attending: Obstetrics & Gynecology | Admitting: Obstetrics & Gynecology

## 2011-11-23 ENCOUNTER — Encounter (HOSPITAL_COMMUNITY): Payer: Self-pay | Admitting: *Deleted

## 2011-11-23 DIAGNOSIS — G43B0 Ophthalmoplegic migraine, not intractable: Secondary | ICD-10-CM

## 2011-11-23 DIAGNOSIS — O10019 Pre-existing essential hypertension complicating pregnancy, unspecified trimester: Secondary | ICD-10-CM | POA: Insufficient documentation

## 2011-11-23 DIAGNOSIS — G43809 Other migraine, not intractable, without status migrainosus: Secondary | ICD-10-CM | POA: Insufficient documentation

## 2011-11-23 LAB — URINALYSIS, ROUTINE W REFLEX MICROSCOPIC
Hgb urine dipstick: NEGATIVE
Nitrite: NEGATIVE
Specific Gravity, Urine: <1.005 — ABNORMAL LOW (ref 1.005–1.030)
Urobilinogen, UA: 0.2 mg/dL (ref 0.0–1.0)
pH: 6 (ref 5.0–8.0)

## 2011-11-23 MED ORDER — ONDANSETRON HCL 4 MG PO TABS: 4.0000 mg | ORAL_TABLET | Freq: Two times a day (BID) | ORAL | Status: AC | PRN

## 2011-11-23 MED ORDER — ACETAMINOPHEN 500 MG PO TABS: 500.0000 mg | ORAL_TABLET | Freq: Four times a day (QID) | ORAL | Status: AC | PRN

## 2011-11-23 NOTE — ED Provider Notes (Signed)
History   Pt with Hx od [place] Hypertension for what she takes Metyldopa BID, comes today to [facility] after presenting an episode of pointy small lights intermittents on bilateral alternating visual fields. First was on right eye then migrate to left eye. This lasted for about 20 min and the she had an episode of very mild unilateral headache that lasted longer but completely resolved. Also complaints of nausea with the episode described before. No actual vomiting or diarrhea. Afebrile. No vaginal discharge or bleeding. No urinary symptoms.  Chief Complaint  Patient presents with  . Headache  . Blurred Vision   HPI  OB History    Grav Para Term Preterm Abortions TAB SAB Ect Mult Living   2 1 1       1       Past Medical History  Diagnosis Date  . Hypertension     Past Surgical History  Procedure Date  . No past surgeries     Family History  Problem Relation Age of Onset  . Miscarriages / Stillbirths Maternal Grandmother   . Miscarriages / Stillbirths Maternal Grandfather   . Miscarriages / [place] Mother   . Asthma Mother   . Anesthesia problems Neg Hx   . Hypotension Neg Hx   . Malignant hyperthermia Neg Hx   . Pseudochol deficiency Neg Hx     History  Substance Use Topics  . Smoking status: Never Smoker   . Smokeless tobacco: Not on file  . Alcohol Use: No    Allergies: No Known Allergies  Prescriptions prior to admission  Medication Sig Dispense Refill  . methyldopa (ALDOMET) 500 MG tablet Take 250 mg by mouth 2 (two) times daily.      . Prenatal Vit-Fe Psac Cmplx-FA (PRENATAL MULTIVITAMIN) 60-1 MG tablet Take 1 tablet by mouth daily.        Marland Kitchen DISCONTD: methyldopa (ALDOMET) 500 MG tablet Take 0.5 tablets (250 mg total) by mouth 2 (two) times daily.  60 tablet  3    Review of Systems  Constitutional: Negative.  Negative for fever and chills.  Eyes: Negative for blurred vision, double vision, photophobia, pain and redness.       Intermittent scotoma    Respiratory: Negative.   Cardiovascular: Negative.   Gastrointestinal: Positive for nausea. Negative for vomiting, abdominal pain and diarrhea.  Genitourinary: Negative for dysuria, urgency and frequency.  Musculoskeletal: Negative.   Neurological: Negative.  Negative for dizziness.   Physical Exam   Blood pressure 135/78, pulse 101, temperature 97.6 F (36.4 C), temperature source Oral, resp. rate 20, weight 83.915 kg (185 lb), last menstrual period 04/17/2011. Second set of BP was 123/78 and 119/87. Physical Exam  Constitutional: She is oriented to person, place, and time. No distress.  HENT:  Mouth/Throat: Oropharynx is clear and moist.  Eyes: Conjunctivae and EOM are normal. Pupils are equal, round, and reactive to light.  Neck: Neck supple.  Cardiovascular: Normal rate, regular rhythm and normal heart sounds.   No murmur heard. Respiratory: Effort normal and breath sounds normal. No respiratory distress.  GI: Bowel sounds are normal. There is no tenderness. There is no rebound.  Musculoskeletal: Normal range of motion. She exhibits no edema.  Neurological: She is alert and oriented to person, place, and time. She has normal reflexes.       No neurologic focalization.    MAU Course  Procedures  MDM Normal sets of BP. Normal urinalysis. No proteinuria  Assessment and Plan  26 y/o F G2 P1  with 31.3 weeks and Chronic HTN that presented with scotoma and unilateral headache and nausea more consistent with ophthalmoplegic migraine. 1. Rest 2. Information given in Spanish and red flags discussed. 3. Tylenol and Zofran PRN. 4. Continue scheduled appointments with [facility].  D. [place] Sherron Flemings Paz. MD PGY-1 11/23/2011, 7:03 PM

## 2011-11-23 NOTE — Progress Notes (Signed)
Pt states, " I have had three headaches weeks that occurs after I have blurred vision and spots before my eyes. This one started this afternoon. I am worried because I have high blood pressure."

## 2011-12-02 ENCOUNTER — Ambulatory Visit (INDEPENDENT_AMBULATORY_CARE_PROVIDER_SITE_OTHER): Payer: Self-pay | Admitting: Obstetrics & Gynecology

## 2011-12-02 DIAGNOSIS — O0990 Supervision of high risk pregnancy, unspecified, unspecified trimester: Secondary | ICD-10-CM

## 2011-12-02 DIAGNOSIS — O359XX0 Maternal care for (suspected) fetal abnormality and damage, unspecified, not applicable or unspecified: Secondary | ICD-10-CM

## 2011-12-02 DIAGNOSIS — O358XX0 Maternal care for other (suspected) fetal abnormality and damage, not applicable or unspecified: Secondary | ICD-10-CM

## 2011-12-02 DIAGNOSIS — R945 Abnormal results of liver function studies: Secondary | ICD-10-CM

## 2011-12-02 DIAGNOSIS — O10019 Pre-existing essential hypertension complicating pregnancy, unspecified trimester: Secondary | ICD-10-CM

## 2011-12-02 DIAGNOSIS — R7989 Other specified abnormal findings of blood chemistry: Secondary | ICD-10-CM

## 2011-12-02 DIAGNOSIS — I10 Essential (primary) hypertension: Secondary | ICD-10-CM

## 2011-12-02 LAB — POCT URINALYSIS DIP (DEVICE)
Ketones, ur: NEGATIVE mg/dL
Leukocytes, UA: NEGATIVE
Nitrite: NEGATIVE
Protein, ur: NEGATIVE mg/dL
Urobilinogen, UA: 0.2 mg/dL (ref 0.0–1.0)

## 2011-12-02 NOTE — Progress Notes (Signed)
Pulse-88. No further complaints

## 2011-12-02 NOTE — Patient Instructions (Signed)
Amamantar al beb (Breastfeeding) LOS BENEFICIOS DE AMAMANTAR Para el beb  La primera leche (calostro ) ayuda al mejor funcionamiento del sistema digestivo del beb.   La leche tiene anticuerpos que provienen de la madre y que ayudan a prevenir las infecciones en el beb.   Hay una menor incidencia de asma, enfermedades alrgicas y SMSI (sndrome de muerte sbita nfantil).   Los nutrientes que contiene la leche materna son mejores que las frmulas para el bibern y favorecen el desarrollo cerebral.   Los bebs amamantados sufren menos gases, clicos y constipacin.  Para la mam  La lactancia materna favorece el desarrollo de un vnculo muy especial entre la madre y el beb.   Es ms conveniente, siempre disponible a la temperatura adecuada y ms econmica que la leche maternizada.   Consume caloras en la madre y la ayuda a perder el peso ganado durante el embarazo.   Favorece la contraccin del tero a su tamao normal, de manera ms rpida y disminuye las hemorragias luego del parto.   Las madres que amamantan tienen menor riesgo de desarrollar cncer de mama.  AMAMNTELO CON FRECUENCIA  Un beb sano, nacido a trmino, puede amamantarse con tanta frecuencia como cada hora, o espaciar las comidas cada tres horas.   Esta frecuencia variar de un beb a otro. Observe al beb cuando manifieste signos de hambre, antes que regirse por el reloj.   Amamntelo tan seguido como el beb lo solicite, o cuando usted sienta la necesidad de aliviar sus mamas.   Despierte al beb si han pasado 3  4 horas desde la ltima comida.   El amamantamiento frecuente la ayudar a producir ms leche y a prevenir problemas de dolor en los pezones e hinchazn de las mamas.  LA POSICIN DEL BEB PARA AMAMANTARLO  Ya sea que se encuentre acostada o sentada, asegrese que el abdomen del beb enfrente el suyo.   Sostenga la mama con el pulgar por arriba y el resto de los dedos por debajo. Asegrese que  sus dedos se encuentren lejos del pezn y de la boca del beb.   Toque suavemente los labios del beb y la mejilla ms cercana a la mama con el dedo o el pezn.   Cuando la boca del beb se abra lo suficiente, introduzca el pezn y la zona oscura que lo rodea tanto como le sea posible dentro de la boca.   Coloque a beb cerca suyo de modo que su nariz y mejillas toquen las mamas al mamar.  LAS COMIDAS  La duracin de cada comida vara de un beb a otro y de una comida a otra.   El beb debe succionar alrededor de dos o tres minutos para que le llegue leche. Esto se denomina "bajada". Por este motivo, permita que el nio se alimente en cada mama todo lo que desee. Terminar de mamar cuando haya recibido la cantidad adecuada de nutrientes.   Para detener la succin coloque su dedo en la comisura de la boca del nio y deslcelo entre sus encas antes de quitarle la mama de la boca. Esto la ayudar a evitar el dolor en los pezones.  REDUCIR LA CONGESTIN DE LAS MAMAS  Durante la primera semana despus del parto, usted puede experimentar congestin en las mamas. Cuando las mamas estn congestionadas, se sienten calientes, llenas y molestas al tacto. Puede reducir la congestin si:   Lo amamanta frecuentemente, cada 2-3 horas. Las mams que amamantan pronto y con frecuencia tienen menos problemas   de congestin.   Coloque bolsas fras livianas entre cada mamada. Esto ayuda a reducir la hinchazn. Envuelva las bolsas de hielo en una toalla liviana para proteger su piel.   Aplique compresas hmedas calientes sobre la mama durante 5 a 10 minutos antes de amamantar al nio. Esto aumenta la circulacin y ayuda a que la leche fluya.   Masajee suavemente la mama antes y durante la alimentacin.   Asegrese que el nio vaca al menos una mama antes de cambiar de lado.   Use un sacaleche para vaciar la mama si el beb se duerme o no se alimenta bien. Tambin podr quitarse la leche con esta bomba si  tiene que volver al trabajo o siente que las mamas estn congestionadas.   Evite los biberones, chupetes o complementar la alimentacin con agua o jugos en lugar de la leche materna.   Verifique que el beb se encuentra en la posicin correcta mientras lo alimenta.   Evite el cansancio, el estrs y la anemia   Use un soutien que sostenga bien sus mamas y evite los que tienen aro.   Consuma una dieta balanceada y beba lquidos en cantidad.  Si sigue estas indicaciones, la congestin debe mejorar en 24 a 48 horas. Si an tiene dificultades, consulte a su asesor en lactancia. TENDR SUFICIENTE LECHE MI BEB? Algunas veces las madres se preocupan acerca de si sus bebs tendrn la leche suficiente. Puede asegurarse que el beb tiene la leche suficiente si:  El beb succiona y escucha que traga activamente.   El nio se alimenta al menos 8 a 12 veces en 24 horas. Alimntelo hasta que se desprenda por sus propios medios o se quede dormido en la primera mama (al menos durante 10 a 20 minutos), luego ofrzcale el otro lado.   El beb moja 5 a 6 paales descartables (6 a 8 paales de tela) en 24 horas cuando tiene 5  6 das de vida.   Tiene al menos 2-3 deposiciones todos los das en los primeros meses. La leche materna es todo el alimento que el beb necesita. No es necesario que el nio ingiera agua o preparados de bibern. De hecho, para ayudar a que sus mamas produzcan ms leche, lo mejor es no darle al beb suplementos durante las primeras semanas.   La materia fecal debe ser blanda y amarillenta.   El beb debe aumentar 112 a 196 g por semana.  CUDESE Cuide sus mamas del siguiente modo:  Bese o dchese diariamente.   No lave sus pezones con jabn.   Comience a amamantar del lado izquierdo en una comida y del lado derecho en la siguiente.   Notar que aumenta el suministro de leche a los 2 a 5 das despus del parto. Puede sentir algunas molestias por la congestin, lo que hace que  sus mamas estn duras y sensibles. La congestin disminuye en 24 a 48 horas. Mientras tanto, aplique toallas hmedas calientes durante 5 a 10 minutos antes de amamantar. Un masaje suave y la extraccin de un poco de leche antes de amamantar ablandarn las mamas y har ms fcil que el beb se agarre. Use un buen sostn y seque al aire los pezones durante 10 a 15 minutos luego de cada alimentacin.   Solo utilice apsitos de algodn.   Utilice lanolina pura sobre los pezones luego de amamantar. No necesita lavarlos luego de alimentar al nio.  Cudese del siguiente modo:   Consuma alimentos bien balanceados y refrigerios nutritivos.     Beba leche, jugos de fruta y agua para satisfacer la sed (alrededor de 8 vasos por da).   Descanse lo suficiente.   Aumente la ingesta de calcio en la dieta (1200mg/da).   Evite los alimentos que usted nota que puedan afectar al beb.  SOLICITE ATENCIN MDICA SI:  Tiene preguntas que formular o dificultades con la alimentacin a pecho.   Necesita ayuda.   Observa una zona dura, roja y que le duele en la zona de la mama, y se acompaa de fiebre de 100.5 F (38.1 C) o ms.   El beb est muy somnoliento como para alimentarse bien o tiene problemas para dormir.   El beb moja menos de 6 paales por da, a partir de los 5 das de vida.   La piel del beb o la parte blanca de sus ojos est ms amarilla de lo que estaba en el hospital.   Se siente deprimida.  Document Released: 10/14/2005 Document Revised: 06/26/2011 [employer] Patient Information 2012 [employer]. 

## 2011-12-02 NOTE — Progress Notes (Signed)
No complaints.  Will start 2x/week testing this week.  LFTs normal on 11/18/11.  No other complaints or concerns.  Fetal movement, preeclampsia, and labor precautions reviewed.

## 2011-12-03 ENCOUNTER — Ambulatory Visit (INDEPENDENT_AMBULATORY_CARE_PROVIDER_SITE_OTHER): Payer: Self-pay | Admitting: *Deleted

## 2011-12-03 VITALS — BP 128/71 | Wt 191.5 lb

## 2011-12-03 DIAGNOSIS — O10019 Pre-existing essential hypertension complicating pregnancy, unspecified trimester: Secondary | ICD-10-CM

## 2011-12-03 NOTE — Progress Notes (Signed)
P-89 

## 2011-12-03 NOTE — Progress Notes (Signed)
NST performed on 12/03/2011 was reviewed and was found to be reactive; AFI 15.9.  Continue recommended antenatal testing and prenatal care.

## 2011-12-05 ENCOUNTER — Ambulatory Visit (INDEPENDENT_AMBULATORY_CARE_PROVIDER_SITE_OTHER): Payer: Self-pay | Admitting: *Deleted

## 2011-12-05 VITALS — BP 120/67 | Wt 188.2 lb

## 2011-12-05 DIAGNOSIS — O10019 Pre-existing essential hypertension complicating pregnancy, unspecified trimester: Secondary | ICD-10-CM

## 2011-12-05 NOTE — Progress Notes (Signed)
NST performed on 12/05/2011 was reviewed and was found to be reactive.  Continue recommended antenatal testing and prenatal care.

## 2011-12-05 NOTE — Progress Notes (Signed)
P - 112 

## 2011-12-09 ENCOUNTER — Ambulatory Visit (INDEPENDENT_AMBULATORY_CARE_PROVIDER_SITE_OTHER): Payer: Self-pay | Admitting: Obstetrics and Gynecology

## 2011-12-09 DIAGNOSIS — O359XX0 Maternal care for (suspected) fetal abnormality and damage, unspecified, not applicable or unspecified: Secondary | ICD-10-CM

## 2011-12-09 DIAGNOSIS — O0990 Supervision of high risk pregnancy, unspecified, unspecified trimester: Secondary | ICD-10-CM

## 2011-12-09 DIAGNOSIS — O10019 Pre-existing essential hypertension complicating pregnancy, unspecified trimester: Secondary | ICD-10-CM

## 2011-12-09 LAB — POCT URINALYSIS DIP (DEVICE)
Glucose, UA: NEGATIVE mg/dL
Leukocytes, UA: NEGATIVE
Nitrite: NEGATIVE
Specific Gravity, Urine: 1.015 (ref 1.005–1.030)
Urobilinogen, UA: 0.2 mg/dL (ref 0.0–1.0)

## 2011-12-09 NOTE — Progress Notes (Signed)
Addended by: Jill Side on: 12/09/2011 11:12 AM   Modules accepted: Orders

## 2011-12-09 NOTE — Progress Notes (Signed)
Edema trace on feet, hands (in the am).

## 2011-12-09 NOTE — Progress Notes (Signed)
Patient doing well without complaints. Continue NST twice weekly. Patient to be scheduled for IOL at 39 weeks. Patient has f/u ultrasound scheduled for 2/18.

## 2011-12-12 ENCOUNTER — Ambulatory Visit (INDEPENDENT_AMBULATORY_CARE_PROVIDER_SITE_OTHER): Payer: Self-pay | Admitting: *Deleted

## 2011-12-12 VITALS — BP 121/67 | Wt 191.2 lb

## 2011-12-12 DIAGNOSIS — O10019 Pre-existing essential hypertension complicating pregnancy, unspecified trimester: Secondary | ICD-10-CM

## 2011-12-12 NOTE — Progress Notes (Signed)
P = 122  NST only today

## 2011-12-13 NOTE — Progress Notes (Signed)
I have reviewed the category i tracing of 12/09/2011

## 2011-12-16 ENCOUNTER — Ambulatory Visit (HOSPITAL_COMMUNITY)
Admission: RE | Admit: 2011-12-16 | Discharge: 2011-12-16 | Disposition: A | Discharge status: Home / Self Care | Payer: Self-pay | Source: Ambulatory Visit | Attending: Obstetrics & Gynecology | Admitting: Obstetrics & Gynecology

## 2011-12-16 ENCOUNTER — Ambulatory Visit (INDEPENDENT_AMBULATORY_CARE_PROVIDER_SITE_OTHER): Payer: Self-pay | Admitting: Obstetrics & Gynecology

## 2011-12-16 DIAGNOSIS — Z3689 Encounter for other specified antenatal screening: Secondary | ICD-10-CM | POA: Insufficient documentation

## 2011-12-16 DIAGNOSIS — O169 Unspecified maternal hypertension, unspecified trimester: Secondary | ICD-10-CM

## 2011-12-16 DIAGNOSIS — O10019 Pre-existing essential hypertension complicating pregnancy, unspecified trimester: Secondary | ICD-10-CM | POA: Insufficient documentation

## 2011-12-16 DIAGNOSIS — O358XX0 Maternal care for other (suspected) fetal abnormality and damage, not applicable or unspecified: Secondary | ICD-10-CM | POA: Insufficient documentation

## 2011-12-16 DIAGNOSIS — O359XX0 Maternal care for (suspected) fetal abnormality and damage, unspecified, not applicable or unspecified: Secondary | ICD-10-CM

## 2011-12-16 DIAGNOSIS — I10 Essential (primary) hypertension: Secondary | ICD-10-CM

## 2011-12-16 LAB — POCT URINALYSIS DIP (DEVICE)
Bilirubin Urine: NEGATIVE
Glucose, UA: NEGATIVE mg/dL
Leukocytes, UA: NEGATIVE
Nitrite: NEGATIVE

## 2011-12-16 NOTE — Progress Notes (Signed)
Edema +1 on ankles, traces on feet, and hands.

## 2011-12-16 NOTE — Patient Instructions (Signed)
Hipertensin en el embarazo (Hypertension in Pregnancy) Hipertensin significa presin arterial elevada. La presin arterial hace circular la sangre por el organismo. En algunos casos, la fuerza que moviliza la sangre se hace muy intensa. Durante el embarazo, este problema debe controlarse con mucha atencin. CUIDADOS EN EL HOGAR   Siga estrictamente las indicaciones del profesional.   Tome slo la medicacin que le indic el mdico.  SOLICITE AYUDA DE INMEDIATO SI:  Siente dolor en el vientre (abdominal).   Observa hinchazn repentina de las manos, tobillos o el rostro.   Aumenta ms de 4 libras (1.8 kilogramos) en una semana.   Vomita repetidas veces.   Tiene hemorragia vaginal.   No siente los movimientos del beb.   Le duele la cabeza.   Tiene visin borrosa o doble.   Tiene tics o espasmos musculares.   Le falta el aire.   Las uas y los labios estn azules.   Observa sangre en la orina.  ASEGRESE DE QUE:  Comprende estas instrucciones.   Controlar su enfermedad.   Solicitar ayuda de inmediato si no mejora o empeora.  Document Released: 01/29/2011 ExitCare Patient Information 2012 Exit[employer], LLC. 

## 2011-12-16 NOTE — Progress Notes (Signed)
[place] today 61%ile, nl AFI, cephalic, no persistent pyelectasis  Good FM, no contractions or pain.

## 2011-12-17 NOTE — Progress Notes (Signed)
NST 12/16/11 reviewed, reactive 

## 2011-12-19 ENCOUNTER — Ambulatory Visit (INDEPENDENT_AMBULATORY_CARE_PROVIDER_SITE_OTHER): Payer: Self-pay | Admitting: *Deleted

## 2011-12-19 VITALS — BP 118/74 | Wt 192.2 lb

## 2011-12-19 DIAGNOSIS — O10019 Pre-existing essential hypertension complicating pregnancy, unspecified trimester: Secondary | ICD-10-CM

## 2011-12-19 NOTE — Progress Notes (Signed)
P = 110  Pt has URI sx today- informed of OTC meds that she may take. Eda Royal- interpreter.

## 2011-12-20 NOTE — Progress Notes (Signed)
NST 12/19/11 reviewed, reactive

## 2011-12-20 NOTE — Progress Notes (Deleted)
NST 12/20/11 reviewed, reactive 

## 2011-12-23 ENCOUNTER — Ambulatory Visit (INDEPENDENT_AMBULATORY_CARE_PROVIDER_SITE_OTHER): Payer: Self-pay | Admitting: Obstetrics & Gynecology

## 2011-12-23 DIAGNOSIS — I10 Essential (primary) hypertension: Secondary | ICD-10-CM

## 2011-12-23 DIAGNOSIS — O10019 Pre-existing essential hypertension complicating pregnancy, unspecified trimester: Secondary | ICD-10-CM

## 2011-12-23 LAB — POCT URINALYSIS DIP (DEVICE)
Bilirubin Urine: NEGATIVE
Glucose, UA: NEGATIVE mg/dL
Ketones, ur: NEGATIVE mg/dL
Nitrite: NEGATIVE
pH: 7 (ref 5.0–8.0)

## 2011-12-23 NOTE — Progress Notes (Signed)
Cultures next week.  No fevers.  Nasal congestion.  Pt to take OTC H2 blocker.  No pseudoephedrine.  Pt has chalazion of right eye.  Warm compresses 4x day.  No conjunctivitis noted.  If present after delivery, will refer to opthalmologist.

## 2011-12-23 NOTE — Progress Notes (Signed)
C/o edematous area under right eye, x 1 week,  and cold or allergy  symptoms including stuffy nose denies cough or chest congestion. C/o clear  nasal drainage .Used Interpreter [employer].   NST reactive with baseline 130.  No decelerations.

## 2011-12-23 NOTE — Progress Notes (Signed)
IOL 01/15/12 at 7 am.

## 2011-12-23 NOTE — Patient Instructions (Addendum)
Anticonceptivos orales (Oral Contraceptives) Los anticonceptivos orales son medicamentos que se utilizan para Location manager. Es el mtodo reversible ms ampliamente utilizado. Su funcin es [employer] ovarios liberen vulos. Las hormonas de los anticonceptivos orales hacen que el moco cervical se haga ms espeso, lo que evita que el esperma ingrese al tero. Tambin hacen que la membrana que tapiza el tero se vuelva ms fina, lo que no permite que el huevo fertilizado se adhiera a la pared del tero. Cuando se toman exactamente como se prescriben, el porcentaje de fracaso es entre el 1% y el 3%  menos. HAY DOS TIPOS DE ANTICONCEPTIVOS [employer]  Los que contienen una mezcla de estrgenos y progesterona son los ms comunes. Se toman durante 21 das seguidos y se suspenden por 7 [address]  [address]. Vienen en envases de 28 pldoras, y 7 de ellas son incactivas. Debe tomar [place] da. Por lo tanto no necesita recordar cundo Tax adviser a comenzar a Hydrologist. La mayora de las mujeres tendrn su perodo menstrual 2  3 das despus de tomar la pldora de hormonas. El perodo menstrual generalmente se hace menos abundante y ms breve. No debe tomarlos si est amamantando.   Los anticonceptivos que slo contienen progesterona (minipldoras) no contienen estrgenos. Deben tomarse [employer]. Podr ser que el perodo menstrual sea de slo una pequea mancha o no tenga perodo en absoluto.. Si est amamantando podr tomar aquellas pldoras anticonceptivas que contengan slo progesterona.  Los anticonceptivos orales se presentan en:  Envases de 21 pldoras, sin pldoras inactivas para tomar Energy Transfer [employer] 7 Walt Disney.   Envases de 28 pldoras, para tomar Management consultant. Las ltimas 7 no contienen hormonas.   Envases de 91 pldoras (uso continuo o extendido) para tomar una pldora por da. Las primeras 84 contienen hormonas y las 7 ltimas no contienen. En este momento tendr su perodo  menstrual. No tendr su perodo durante el tiempo en que tome las primeras 84 pldoras.  COMO TOMAR LOS ANTICONCEPTIVOS ORALES El mdico le indicar como comenzar a Surveyor, minerals ciclo de anticonceptivos orales. De lo contrario usted puede:  Comenzar con Company secretary de anticonceptivos Architect 1 o el da 5 de su ciclo menstrual, No necesitar proteccin anticonceptiva adicional al Investment banker, operational.   Comience el primer domingo, o el da 7 luego de su perodo menstrual, o Medical laboratory scientific officer en que adquiere el [employer]. En estos casos deber [employer] proteccin anticonceptiva adicional durante Tax inspector.  No importa cuando comience a tomar los anticonceptivos, siempre empiece un nuevo envase el mismo da de la [place]. Es Neomia Dear buena idea tener un envase extra de pldoras anticonceptivas y un anticonceptivo adicional para el caso en que se olvide de tomar algunas pldoras o pierda la caja. MOTIVOS FRECUENTES PARA EL FRACASO  Olvido de tomar la [employer] a la misma hora.   Escasa absorcin del medicamento desde el estmago hacia el torrente sanguneo. El motivo puede ser que haya sufrido diarrea, vmitos o el uso de algunos medicamentos para combatir grmenes (antibiticos).   Trastornos estomacales o intestinales.   El consumo de la pldora con otros medicamentos que pueden disminuir su efectividad, como [place], [place], fenobarbital y rifampicina.   Uso de anticonceptivos que han pasado su fecha de vencimiento.   Cuando se [place] el envase de [place], se olvida recomenzar el uso en el da 7.  Si se olvida de tomar [employer] Hughes [employer], tmela tan  pronto como lo recuerde, y tome la siguiente en el momento correspondiente. Si olvida tomar 2  ms pldoras, utilice un mtodo anticonceptivo adicional cuando comience su prximo perodo menstrual. Adems, puede tener una pequea prdida de sangre o hemorragia si olvida tomar 2  ms pldoras. Si utiliza el envase de 28  91  pldoras y [place] tomar 1 de las ltimas 7 (pldoras sin hormonas), no tiene Quarry manager. Simplemente deseche el resto de las pldoras que no contienen hormonas y comience un nuevo envase de 28  91 pldoras. USOS COMUNES DE LOS ANTICONCEPTIVOS ORALES  Disminucin de los sntomas (problemas) premenstruales.   Alivio de los clicos durante el perodo menstrual.   Evitar un Psychiatrist.   Regulacin del ciclo menstrual.   Tratamiento para el acn.   Disminuyen el flujo menstrual excesivo.   Tratamiento de las hemorragias uterinas disfuncionales (anormales).   Dolor plvico crnico.   Tratamiento del sndrome del ovario poliqustico (el ovario no produce vulos y forma pequeos quistes).   Tratamiento de la endometriosis (tejido que tapiza internamente al tero ubicado patolgicamente en la pelvis, los ovarios, tero y trompas).   Los anticonceptivos orales pueden utilizarse como anticonceptivos de Associate Professor.  Los anticonceptivos orales NO previenen contra las enfermedades de trasmisin sexual. La prctica del sexo seguro, como el uso de preservativos, junto con la pldora, [place] a prevenir ese tipo de enfermedades.  BENEFICIOS  Reducen el riesgo de:   Cncer de ovario y tero.   Quiste ovrico   Infecciones plvicas   Sntomas del sndrome ovrico poliqustico   Prdida de masa sea (osteoporosis).   Enfermedades mamarias no cancerosas (benignas) .   Dficit de glbulos rojos (anemia) debido a perodos menstruales abundantes o prolongados.   Embarazo ectpico (embarazo fuera del tero).   Acn.   Disminuye el flujo de los perodos abundantes.   En algunos casos mejora el sndrome premenstrual.   Alivia los clicos y Chief Technology Officer.   Controla los perodos [employer].   Pueden usarse como anticonceptivos de emergencia  USTED NO DEBE TOMAR LA PLDORA SI:  Quiere quedar embarazada o lo est.   Usted presenta una hemorragia vaginal que no es normal.    Tiene una historia de enfermedad heptica, ictus o ataque cardaco.   Fuma   Tiene una historia de problemas de coagulacin, cncer o problemas cardacos.   Sufre una enfermedad en la vescula biliar.   Sufre cncer de mama o sospecha tenerlo.   Sufre o sospecha tener cncer plvico.   Tiene la presin arterial elevada.   Tiene niveles elevados de colesterol o triglicridos.   Sufre depresin.   Est amamantando, excepto en el caso que tome anticonceptivos que contengan slo progesterona, con aprobacin del mdico.   Sufre diabetes y tiene complicaciones renales, oculares o en los vasos sanguneos. O si ha tenido diabetes durante 20 aos o ms.   Tiene una enfermedad en las vlvulas cardacas.   Tiene cefaleas migraosas Pueden empeorar.  Antes de tomar la pldora, toda mujer debe hacerse un examen fsico y un [place]. El Ecologist indicar anlisis de sangre para Sales executive nivel de azcar en la sangre, colesterol y otros anlisis de sangre que puedan ser necesarios. ENTRE LOS EFECTOS ADVERSOS DE LA PLDORA SE INCLUYEN:  Sensibilidad, secrecin y PPL Corporation.   Modificaciones en el impulso sexual (aumento o disminucin de la libido).   Depresin.   Ms cansancio.   Dolor de [place].   Ansiedad.   [place] irregular o hemorragia vaginal  durante algunos meses.   Dolor en las piernas.   Calambres o hinchazn de los miembros (extremidades).   Trastornos del [place] de nimo.   Prdida o aumento de [place].   Ganas de vomitar (nuseas).   Modificaciones en el apetito.   Prdida del cabello   Infeccin vaginal por hongos   Nerviosismo   Erupcin   Acn   Falta de perodo menstrual (amenorrea)  Al comenzar a Psychologist, occupational, es Agricultural consultant o tres meses para que el organismo se adapte (antes de suspender el uso debido a Architectural technologist secundario). Esto permite adaptarse a los [employer]. Si  una mujer contina con los efectos secundarios, podr Multimedia programmer a otro tipo de anticonceptivos orales. Es importante que le comente los trastornos al profesional que la asiste. Con frecuencia, el cambio a una pldora diferente hace que los efectos secundarios disminuyan. RIESGOS Y COMPLICACIONES  Cogulos en las piernas, corazn, pulmones o cerebro.   Presin arterial elevada.   Enfermedad en la vescula biliar.   Cncer de hgado   Sangrado cerebral (Hemorragias )   Ligero aumento en el riesgo de sufrir cncer de mama.  INSTRUCCIONES PARA EL CUIDADO DOMICILIARIO  No fume.   Utilice los medicamentos de venta libre o de prescripcin para Chief Technology Officer, Environmental health practitioner o molestias en las mamas segn se lo indique el mdico.   Siempre use un condn para protegerse de las enfermedades de transmisin sexual. Los anticonceptivos orales no protegen contra las enfermedades de transmisin sexual.   Research scientist (medical) en un calendario las fechas en las que tiene sus perodos [place].  Las indicaciones, los tipos y las dosis varan continuamente. Comente sus elecciones al mdico y decida qu es lo mejor para usted. Siempre hay excepciones a las normas. Lea siempre la informacin que viene en el envase y controle si hay nuevas recomendaciones o normas. SOLICITE ATENCIN MDICA SI:  Presenta nuseas o vmitos.   Brett Fairy hemorragia vaginal anormal.   Necesita tratamiento por sus cefaleas.   Aparece una erupcin cutnea.   No tiene perodo menstrual.   Presenta hemorragia vaginal anormal.   Pierde el cabello.   Necesita tratamiento para los cambios en el estado de nimo o la depresin.   Se marea al tomarlos.   Comienza a aparecer acn.  SOLICITE ATENCIN MDICA DE INMEDIATO SI:  Siente dolor en las piernas.   Siente dolor en el pecho.   Le falta el aire.   Presenta dolor abdominal.   Le duele la cabeza y no se calma.   Presenta adormecimiento o dificultad en el habla.   Tiene problemas  para ver, (prdida de la visin, visin borrosa, o visin doble).   Presenta una hemorragia vaginal abundante.  Si est tomando la pldora, SUSPNDALA INMEDIATAMENTE y COMUNQUESE CON SU MDICO si sufre alguna de las siguientes situaciones:  Siente falta de aire o Journalist, newspaper.   Presenta dolor, inflamacin o hinchazn en las piernas.   Presenta dolor de cabeza intenso, trastornos visuales o dolor abdominal (en el vientre).   La depresin es intensa.   Est embarazada.  Document Released: 07/24/2005 Document Revised: 11/16/2010 [facility] Patient Information 2012 [place], [place].Document Released: 10/14/2005 Document Revised: 06/26/2011 Document Reviewed: 05/29/2009 [facility] Patient Information 2012 [place], [place].Levonorgestrel intrauterine device (IUD) Qu es este medicamento? El LEVONORGESTREL (DIU) es un dispositivo anticonceptivo (control de natalidad). Se utiliza para Location manager durante un perodo de [employer] [place]. Este medicamento puede ser utilizado para otros usos;  si tiene alguna pregunta consulte con su proveedor de atencin mdica o con su farmacutico. Qu le debo informar a mi profesional de la salud antes de tomar este medicamento? Necesita saber si usted presenta alguno de los siguientes problemas o situaciones: -exmen de Papanicolaou anormal -cncer de mama, cuello del tero o tero -diabetes -endometritis -si tiene una infeccin plvica o genital actual o en el pasado -tiene ms de una pareja sexual o si su pareja tiene ms de una pareja -enfermedad cardiaca -antecedente de embarazo tubrico o ectpico -problemas del sistema inmunolgico -DIU colocado -enfermedad heptica o tumor del hgado -problemas con la coagulacin o si toma diluyentes sanguneos -[place] medicamentos intravenoso -forma inusual del tero -sangrado vaginal que no tiene explicacin -una reaccin alrgica o inusual al levonorgestrel, a otras hormonas, a la silicona o polietilenos, a  otros medicamentos, alimentos, colorantes o conservantes -si est embarazada o buscando quedar embarazada -si est amamantando a un beb Cmo debo utilizar este medicamento? Un profesional de Naval architect este dispositivo en el tero. Hable con su pediatra para informarse acerca del uso de este medicamento en nios. Puede requerir atencin especial. Sobredosis: Pngase en contacto inmediatamente con un centro toxicolgico o una sala de urgencia si usted cree que haya tomado demasiado medicamento. ATENCIN: [employer] es solo para usted. No comparta este medicamento con nadie. Qu sucede si me olvido de una dosis? No se aplica en este caso. Qu puede interactuar con este medicamento? No tome esta medicina con ninguno de los siguientes medicamentos: -amprenavir -bosentano -fosamprenavir Esta medicina tambin puede interactuar con los siguientes medicamentos: -aprepitant -barbitricos para producir el sueo o para el tratamiento de convulsiones -bexaroteno -griseofulvina -medicamentos para tratar los convulsiones, tales como [place], [place], [place], [place], [place], topiramato -modafinilo -pioglitazona -rifabutina -rifampicina -rifapentina -algunos medicamentos para tratar el virus VIH, tales como atazanavir, indinavir, lopinavir, nelfinavir, tipranavir, ritonavir -hierba de [place] -warfarina Puede ser que esta lista no menciona todas las posibles interacciones. Informe a su profesional de [employer] de [employer] productos a base de hierbas, medicamentos de [place] o suplementos nutritivos que est tomando. Si usted fuma, consume bebidas alcohlicas o si utiliza drogas ilegales, indqueselo tambin a su profesional de [employer]. Algunas sustancias pueden interactuar con su medicamento. A qu debo estar atento al usar PPL Corporation? Visite a su mdico o a su profesional de la salud para chequear su evolucin peridicamente. Visite a su mdico si usted  o su pareja tiene relaciones sexuales con [employer], se vuelve VIH positivo o contrae una enfermedad de transmisin sexual. Este medicamento no la protege de la infeccin por VIH (SIDA) ni de ninguna otra enfermedad de transmisin sexual. Puede controlar la ubicacin del DIU usted misma palpando con sus dedos limpios los hilos en la parte anterior de la vagina. No tire de los hilos. Es un buen hbito controlar la ubicacin del dispositivo despus de cada perodo menstrual. Si no slo siente los hilos sino que adems siente otra parte ms del DIU o si no puede sentir los hilos, consulte a su mdico inmediatamente. El DIU puede salirse por s solo. Puede quedar embarazada si el dispositivo se sale de Nature conservation officer. Utilice un mtodo anticonceptivo adicional, como preservativos, y consulte a su proveedor de atencin mdica s observa que el DIU se sali de Nature conservation officer. La utilizacin de tampones no cambia la posicin del DIU y no hay inconvenientes en usarlos durante su perodo. Qu efectos secundarios puedo tener al [employer] este medicamento? Efectos secundarios que debe  informar a su mdico o a Producer, television/film/video de la salud tan pronto como sea posible: -reacciones alrgicas como erupcin cutnea, picazn o urticarias, hinchazn de la cara, labios o lengua -fiebre, sntomas gripales -llagas genitales -alta presin sangunea -ausencia de un perodo menstrual durante 6 semanas mientras lo utiliza -Engineer, mining, Public librarian en las piernas -dolor o sensibilidad del plvico -dolor de cabeza repentino o severo -signos de Psychiatrist -calambres estomacales -falta de aliento repentina -problemas de coordinacin, del habla, al caminar -sangrado, flujo vaginal inusual -color amarillento de los ojos o la piel Efectos secundarios que, por lo general, no requieren atencin mdica (debe informarlos a su mdico o a su profesional de la salud si persisten o si son molestos): -acn -dolor de pecho -cambios en el deseo  sexual o capacidad -cambios de peso -calambres, Research scientist (life sciences) o sensacin de [employer] se introduce el dispositivo -dolor de cabeza -sangrado menstruales irregulares en los primeros 3 a 6 meses de usar -nuseas Puede ser que esta lista no menciona todos los posibles efectos secundarios. Comunquese a su mdico por asesoramiento mdico [employer]lett[employer]ard. Usted puede informar los efectos secundarios a la FDA por telfono al 1-800-FDA-1088. Dnde debo guardar mi medicina? No se aplica en este caso. ATENCIN: Este folleto es un resumen. Puede ser que no cubra toda la posible informacin. Si usted tiene preguntas acerca de esta medicina, consulte con su mdico, su farmacutico o su profesional de Radiographer, therapeutic.  2012, Elsevier/Gold Standard. (12/26/2008 3:17:12 PM)

## 2011-12-25 ENCOUNTER — Encounter (HOSPITAL_COMMUNITY): Payer: Self-pay | Admitting: *Deleted

## 2011-12-25 ENCOUNTER — Inpatient Hospital Stay (HOSPITAL_COMMUNITY)
Admission: AD | Admit: 2011-12-25 | Discharge: 2011-12-26 | Disposition: A | Discharge status: Home / Self Care | Payer: Medicaid Other | Source: Ambulatory Visit | Attending: Obstetrics and Gynecology | Admitting: Obstetrics and Gynecology

## 2011-12-25 DIAGNOSIS — A084 Viral intestinal infection, unspecified: Secondary | ICD-10-CM

## 2011-12-25 DIAGNOSIS — A09 Infectious gastroenteritis and colitis, unspecified: Secondary | ICD-10-CM

## 2011-12-25 DIAGNOSIS — O212 Late vomiting of pregnancy: Secondary | ICD-10-CM | POA: Insufficient documentation

## 2011-12-25 HISTORY — DX: Major depressive disorder, single episode, unspecified: F32.9

## 2011-12-25 LAB — URINALYSIS, ROUTINE W REFLEX MICROSCOPIC
Bilirubin Urine: NEGATIVE
Leukocytes, UA: NEGATIVE
Nitrite: NEGATIVE
Specific Gravity, Urine: 1.01 (ref 1.005–1.030)
Urobilinogen, UA: 0.2 mg/dL (ref 0.0–1.0)

## 2011-12-25 NOTE — Progress Notes (Signed)
Pt G2 P1 at 36wks vomiting today x 4, unable to eat, body aches, fever lower abd and back pain.

## 2011-12-26 ENCOUNTER — Ambulatory Visit (INDEPENDENT_AMBULATORY_CARE_PROVIDER_SITE_OTHER): Payer: Self-pay | Admitting: *Deleted

## 2011-12-26 VITALS — BP 123/79 | Wt 192.0 lb

## 2011-12-26 DIAGNOSIS — O10019 Pre-existing essential hypertension complicating pregnancy, unspecified trimester: Secondary | ICD-10-CM

## 2011-12-26 LAB — CBC
HCT: 31.1 % — ABNORMAL LOW (ref 36.0–46.0)
Hemoglobin: 10.3 g/dL — ABNORMAL LOW (ref 12.0–15.0)
MCH: 30.4 pg (ref 26.0–34.0)
MCHC: 33.1 g/dL (ref 30.0–36.0)

## 2011-12-26 LAB — DIFFERENTIAL
Basophils Relative: 0 % (ref 0–1)
Eosinophils Absolute: 0 10*3/uL (ref 0.0–0.7)
Eosinophils Relative: 0 % (ref 0–5)
Monocytes Absolute: 0.4 10*3/uL (ref 0.1–1.0)
Monocytes Relative: 5 % (ref 3–12)

## 2011-12-26 LAB — COMPREHENSIVE METABOLIC PANEL
Albumin: 2.7 g/dL — ABNORMAL LOW (ref 3.5–5.2)
BUN: 9 mg/dL (ref 6–23)
Calcium: 8.7 mg/dL (ref 8.4–10.5)
Creatinine, Ser: 0.55 mg/dL (ref 0.50–1.10)
Total Bilirubin: 0.4 mg/dL (ref 0.3–1.2)
Total Protein: 6.2 g/dL (ref 6.0–8.3)

## 2011-12-26 LAB — LIPASE, BLOOD: Lipase: 21 U/L (ref 11–59)

## 2011-12-26 LAB — AMYLASE: Amylase: 62 U/L (ref 0–105)

## 2011-12-26 MED ORDER — PROMETHAZINE HCL 25 MG PO TABS: 25.0000 mg | ORAL_TABLET | Freq: Four times a day (QID) | ORAL | Status: DC | PRN

## 2011-12-26 MED ORDER — PROMETHAZINE HCL 25 MG/ML IJ SOLN
25.0000 mg | Freq: Once | INTRAVENOUS | Status: AC
  Administered 2011-12-26: 25 mg via INTRAVENOUS
  Filled 2011-12-26: qty 1

## 2011-12-26 MED ORDER — ACETAMINOPHEN 325 MG PO TABS
650.0000 mg | ORAL_TABLET | Freq: Four times a day (QID) | ORAL | Status: DC | PRN
  Administered 2011-12-26: 650 mg via ORAL
  Filled 2011-12-26: qty 2

## 2011-12-26 MED ORDER — LACTATED RINGERS IV SOLN
INTRAVENOUS | Status: DC
  Administered 2011-12-26: 02:00:00 via INTRAVENOUS

## 2011-12-26 NOTE — Progress Notes (Signed)
P = 91 

## 2011-12-26 NOTE — Discharge Instructions (Signed)
Gastroenteritis viral (Viral Gastroenteritis) La gastroenteritis viral tambin es conocida como gripe del estmago. Este trastorno afecta el estmago y el tubo digestivo. La enfermedad generalmente dura entre 3 y 8 das. La mayora de las personas desarrolla una respuesta inmunolgica. Con el tiempo, esto elimina el virus. Mientras se desarrolla esta respuesta natural, el virus puede afectar en forma importante su salud.  CAUSAS La causa de la diarrea y lo vmitos generalmente es un virus. Los medicamentos que destruyen grmenes (antibiticos) no afectarn el curso de la enfermedad a menos que exista tambin una infeccin (bacteriana). SNTOMAS El sntoma ms frecuentes es la diarrea. Esto puede producir una prdida grave de lquidos (deshidratacin) y un desequilibrio de sales corporales (electrolitos). TRATAMIENTO Los tratamientos para este trastorno apuntan a la rehidratacin. No se recomienda el uso de medicamentos para la diarrea. No disminuirn el volumen de diarrea y pueden ser muy dainos. Generalmente todo lo que se necesita es un tratamiento en el hogar. Los casos ms graves se dan cuando los pacientes tienen vmitos y no son capaces de mantener lo lquidos administrados en forma oral. En ese caso, necesitarn la administracin de lquido por va intravenosa. Los vmitos con la gastroenteritis viral son comunes. Pero suelen desaparecer cuando el paciente recibe el tratamiento. INSTRUCCIONES PARA EL CUIDADO DOMICILIARIO Debe tomar pequeas cantidades de lquido con frecuencia. Beber una gran cantidad de una sola vez puede ser difcil de tolerar. El agua corriente puede ser daina en bebs y ancianos. Hay disponibles soluciones de rehidratacin oral en farmacias y supermercados. Las SRO reponen agua y los electrolitos ms importantes en proporciones adecuadas. Las bebidas deportivas no son tan efectivas y pueden ser nocivas debido al contenido de azcar que puede empeorar la diarrea.  Como gua  general en los nios, reponga toda nueva prdida de lquidos ocasionada por diarrea o vmitos con SRO del siguiente modo:   Si el nio pesa 22 libras o menos (10 kg o menos), administre 60-120 ml (1/4 -1/2 taza o 2 - 4 onzas) de SRO en cada episodio de deposicin diarreica o vmito.   Si el nio pesa 22 libras o ms (10 Kg o ms), administre 120-240 ml (1/2 - 1 taza o 4 - 8 onzas) de SRO en cada episodio de vmito o diarrea.   En un nio con vmitos, puede ser til administrar la SRO en 5 ml (1 cucharada) cada 5 minutos, y luego ir aumentando segn sea tolerado.   Mientras se repone de la deshidratacin, el nio debe comer normalmente. Sin embargo, debe evitar los alimentos con alto contenido de azcar debido a que sta empeora la diarrea. Debe evitar el consumo excesivo de gaseosas, jugos, gelatina y otras bebidas que contengan gran cantidad de azcar.   Luego de compensar la deshidratacin, se le pueden dar al nio otros lquidos de su agrado. Los nios deben beber pequeas cantidades de lquido con frecuencia, e ir aumentando la cantidad segn la tolerancia.   Los adultos deben seguir dieta normal y beber ms cantidad de lquido que el habitual. Beba pequeas cantidades de lquido con frecuencia y aumente la cantidad segn la tolerancia. Beba gran cantidad de lquido para mantener la orina de tono claro o color amarillo plido. Los caldos, el t liviano descafeinado, las bebidas lima limn (sin gas) y las SRO reponen lquidos y electrolitos.   Evite:   Gaseosas.   Jugos.   Lquidos muy calientes o fros.   Bebidas con cafena.   Alimentos muy grasos.   Alcohol.   Tabaco.     Comer demasiado a la vez.   Postres de gelatina.   Los probiticos son cultivos activos de bacterias beneficiosas. Pueden disminuir la cantidad y el nmero de deposiciones diarreicas en el adulto. Se encuentran en los yogures con cultivos activos y en suplementos.   Lave bien sus manos para evitar que la  bacteria o el virus se diseminen.   No son recomendables los medicamentos antidiarreicos en bebs y nios.   Solo tome medicamentos de venta libre o recetados para el dolor, el malestar o la fiebre, segn le haya indicado el mdico. No administre aspirina a los nios.   Los adultos con deshidratacin deben consultar a su mdico sobre el uso de medicamentos de venta libre o prescriptos.   Si el mdico le ha dado fecha para una visita de control, es importante que concurra. No cumplir con los controles puede dar como resultado daos o discapacidades duraderas (crnicas) o permanentes. Si tiene problemas para asistir al control, deber comunicarse para reprogramarlo.  SOLICITE ATENCIN MDICA DE INMEDIATO SI:  No puede retener lquidos.   No hay emisin de orina durante 6 a 8 horas o se elimina una pequea cantidad de orina muy oscura.   Le falta el aire.   Observa sangre en el vmito (se ve como caf molido) o en la materia fecal.   Aparece dolor abdominal, o ste aumenta o se localiza.   Tiene vmitos o diarrea persistentes.   Tiene fiebre.   Su beb tiene ms de 3 meses y su temperatura rectal es de 102 F (38.9 C) o ms.   Su beb tiene 3 meses o menos y su temperatura rectal es de 100.4 F (38 C) o ms.  ASEGRESE QUE:   Comprende estas instrucciones.   Controlar su enfermedad.   Solicitar ayuda inmediatamente si no mejora o si empeora.  Document Released: 10/14/2005 Document Revised: 06/26/2011 [employer] Patient Information 2012 [employer]. 

## 2011-12-26 NOTE — Progress Notes (Signed)
M. Williams, CNM at bedside.  Assessment done and poc discussed with pt.   

## 2011-12-26 NOTE — Progress Notes (Signed)
Ok to dc efm per CNM. 

## 2011-12-26 NOTE — ED Provider Notes (Signed)
History     Chief Complaint  Patient presents with  . Emesis  . Fever  . Generalized Body Aches   HPI This is a 26 y.o. at [redacted]w[redacted]d who presents with c/o fever, body aches, N/V which started today. No leaking or bleeding. + FM. Does have some contractions.   OB History    Grav Para Term Preterm Abortions TAB SAB Ect Mult Living   2 1 1       1       Past Medical History  Diagnosis Date  . Hypertension   . Anxiety   . Depression     Past Surgical History  Procedure Date  . No past surgeries     Family History  Problem Relation Age of Onset  . Miscarriages / Stillbirths Maternal Grandmother   . Miscarriages / Stillbirths Maternal Grandfather   . Miscarriages / [place] Mother   . Asthma Mother   . Anesthesia problems Neg Hx   . Hypotension Neg Hx   . Malignant hyperthermia Neg Hx   . Pseudochol deficiency Neg Hx     History  Substance Use Topics  . Smoking status: Never Smoker   . Smokeless tobacco: Not on file  . Alcohol Use: No    Allergies: No Known Allergies  Prescriptions prior to admission  Medication Sig Dispense Refill  . methyldopa (ALDOMET) 500 MG tablet Take 250 mg by mouth 2 (two) times daily.      . Prenatal Vit-Fe Fumarate-FA (PRENATAL MULTIVITAMIN) TABS Take 1 tablet by mouth daily.        ROS As above  Physical Exam   Blood pressure 122/79, pulse 129, temperature 100.3 F (37.9 C), temperature source Oral, resp. rate 18, height 5\' 3"  (1.6 m), weight 192 lb 12.8 oz (87.454 kg), last menstrual period 04/17/2011, SpO2 99.00%.  Physical Exam  Constitutional: She is oriented to person, place, and time. She appears well-developed and well-nourished. No distress.  HENT:  Head: Normocephalic.  Cardiovascular: Normal rate.   Respiratory: Effort normal.  GI: Soft. She exhibits no distension and no mass. There is tenderness (generalized, mild). There is no rebound and no guarding.  Musculoskeletal: Normal range of motion.  Neurological: She  is alert and oriented to person, place, and time.  Skin: Skin is warm and dry.  Psychiatric: She has a normal mood and affect.   EFM:  FHR reactive with UCs every 3-5 minutes   MAU Course  Procedures  Assessment and Plan  A:  [facility]UP at [redacted]w[redacted]d      Nausea and vomiting, probably viral gastroenteritis P:  Will check CBC, CMET, Amylase, Lipase       IV hydration with Phenergan   Bryleigh Ottaway 12/26/2011, 12:17 AM   0205am Feels better.  No further vomiting Cervix:  Dilation: Closed Effacement (%): 50 Cervical Position: Middle Station: -2 Presentation: Undeterminable Exam by:: Humphrey Rolls, RN  UCs stopped Labs WNL, but amylase and lipase are pending Will d/c home when IVF (2nd bag) are infused

## 2011-12-26 NOTE — Progress Notes (Signed)
Pt states she was seen @ MAU last pm for fever and URI sx. She states that she is feeling better today and has no sx of fever.  Eda Royal- interpreter.

## 2011-12-27 ENCOUNTER — Telehealth (HOSPITAL_COMMUNITY): Payer: Self-pay | Admitting: *Deleted

## 2011-12-27 LAB — ABO/RH: RH Type: POSITIVE

## 2011-12-27 NOTE — Telephone Encounter (Signed)
Preadmission screen  

## 2011-12-30 ENCOUNTER — Ambulatory Visit (INDEPENDENT_AMBULATORY_CARE_PROVIDER_SITE_OTHER): Payer: Self-pay | Admitting: Obstetrics & Gynecology

## 2011-12-30 DIAGNOSIS — O10019 Pre-existing essential hypertension complicating pregnancy, unspecified trimester: Secondary | ICD-10-CM

## 2011-12-30 LAB — POCT URINALYSIS DIP (DEVICE)
Protein, ur: NEGATIVE mg/dL
Specific Gravity, Urine: 1.015 (ref 1.005–1.030)
Urobilinogen, UA: 0.2 mg/dL (ref 0.0–1.0)

## 2011-12-30 MED ORDER — PRENATAL MULTIVITAMIN CH: 1.0000 | ORAL_TABLET | Freq: Every day | ORAL | Status: DC

## 2011-12-30 NOTE — Patient Instructions (Signed)
Hipertensin durante el embarazo  (Hypertension During Pregnancy) La hipertensin tambin se denomina presin arterial alta. Puede ocurrir en cualquier momento de la vida y tambin durante el embarazo. Cuando se sufre hipertensin, existe una presin extra en el interior de los vasos sanguneos que llevan la sangre desde el corazn al resto del cuerpo (arterias). La hipertensin durante el embarazo puede causar problemas para usted y el beb. Puede ser que el beb no tenga el peso adecuado al nacer o puede nacer antes de tiempo (prematuro). En los casos muy graves de hipertensin durante el embarazo puede estar en peligro la vida.  Hay diferentes tipos de hipertensin durante el embarazo.   Hipertensin crnica. Esto sucede cuando una mujer sufre de hipertensin antes del embarazo y contina durante el mismo.   Hipertensin gestacional. Es cuando se desarrolla la hipertensin durante el embarazo.   Preeclampsia o toxemia del embarazo. Es un tipo muy grave de hipertensin que se desarrolla slo durante el embarazo. Es una enfermedad que afecta a todo el cuerpo (sistmica) y puede ser muy peligrosa tanto para la madre como para el beb.   La hipertensin gestacional y preeclampsia por lo general desaparecen despus de nacer el beb. La presin arterial generalmente se estabiliza dentro de las 6 semanas. Las mujeres que sufren de hipertensin durante el embarazo tienen una mayor probabilidad de desarrollar hipertensin en etapas posteriores de la vida o en embarazos futuros.  ENTENDER LA PRESIN ARTERIAL  La presin arterial hace que se mueva la sangre en el cuerpo. A veces, la fuerza que mueve la sangre es demasiado intensa.   La lectura de la presin arterial se expresa en 2 nmeros y se ve como una fraccin.   El primer nmero es la presin sistlica. Cuando el corazn late, fuerza a que fluya ms sangre por las arterias. La presin dentro de las arterias aumenta.   El nmero inferior es la  presin diastlica. La presin baja entre los latidos. Eso ocurre cuando el corazn est en reposo.   Usted puede tener hipertensin si:   La presin arterial sistlica es superior a 140.   La presin diastlica es superior a 90.  FACTORES DE RIESGO  Algunos factores favorecen el desarrollo de la hipertensin durante el embarazo. Los factores de riesgo son:   Sufrir hipertensin antes del embarazo.   Haber sufrido hipertensin durante un embarazo anterior.   Tener sobrepeso.   Ser mayor de 40 aos.   Estar embarazada de ms de un beb (mltiples).   Tener diabetes o problemas renales.  SNTOMAS  La hipertensin gestacional y crnica pueden no causar sntomas. La preeclampsia causa sntomas, que pueden ser:   Aumento de las protenas en la orina. El mdico va a controlar esto en cada control prenatal.   Hinchazn de las manos y la cara.   Aumento rpido de peso.   Dolores de cabeza.   Cambios visuales.   Molestias al ver la luz.   Dolor abdominal, especialmente en el rea superior derecha.   Dolor en el pecho.   Falta de aire.   Aumento de los reflejos.   Convulsiones. Las convulsiones ocurren en una forma ms grave de preeclampsia, llamada eclampsia.  DIAGNSTICO   Puede ser diagnosticada con hipertensin en el embarazo durante un control prenatal regular. En cada visita, las pruebas pueden ser:   Control de la presin arterial.   Anlisis de orina para detectar protenas en la orina.   El tipo de hipertensin que se diagnostica depende del momento   en que se desarroll. Tambin depende de la lectura de su presin arterial especfica.   El desarrollo de hipertensin antes de las 20 semanas de embarazo es consistente con hipertensin crnica.   El desarrollo de la hipertensin despus de las 20 semanas de embarazo es consistente con hipertensin gestacional.   Hipertensin con aumento de la protena urinaria se diagnostica como preeclampsia.   Las mediciones  de la presin arterial de ms de 160 sistlica o 110 diastlica son un signo de preeclampsia grave.  TRATAMIENTO  El tratamiento para la hipertensin durante el embarazo vara. Depende del tipo de hipertensin y de su gravedad.   Si toma medicamentos para la hipertensin crnica, puede que tenga que cambiarlos.   Los medicamentos llamados inhibidores de la ECA no deben tomarse durante el embarazo.   Para las mujeres que tienen factores de riesgo de preeclampsia pueden recomendarse bajas dosis de aspirina.   Si usted tiene hipertensin gestacional, tendr que tomar un medicamento para la presin arterial que sea seguro durante el embarazo. Su mdico le recomendar el medicamento apropiado.   Si tiene preeclampsia grave, es posible que tenga que permanecer en el hospital. Los mdicos la controlarn a usted y al beb muy de cerca. Puede ser que necesite tomar medicamentos (sulfato de magnesio) para prevenir las convulsiones y reducir la presin arterial.   A veces es necesario un parto prematuro. Este puede ser el caso si el problema empeora. Se hace para protegerlos a usted y a su beb. La nica cura para la preeclampsia es el parto.  INSTRUCCIONES PARA EL CUIDADO EN EL HOGAR   Cumpla con todos los controles prenatales regulares.   Siga las indicaciones del profesional con respecto a como tomar los medicamentos. Dgale a su mdico sobre todos los medicamentos que toma. Incluya los medicamentos de venta libre.   Consuma la menor cantidad posible de sal.   Realice actividad fsica con regularidad.   No beba alcohol.   No use productos que contengan tabaco.   No tome bebidas con cafena.   Acustese sobre el lado izquierdo cuando haga reposo.   Informe a su mdico si tiene sntomas de preeclampsia.  SOLICITE ATENCIN MDICA DE INMEDIATO SI:   Siente un dolor abdominal intenso.   Observa hinchazn repentina y excesiva en las manos, tobillos o el rostro.   Aumento de peso de ms de 4  libras (1,8 kg) o ms en una semana.   Vomita repetidas veces.   Presenta una hemorragia vaginal abundante.   No siente los movimientos del beb.   Sufre una cefalea grave.   Tiene visin doble o borrosa.   Tiene calambres o espasmos musculares.   Le falta el aire.   Tiene las yemas de los dedos y los labios azules.   Observa sangre en la orina.  ASEGRESE DE QUE:   Comprende estas instrucciones.   Controlar su enfermedad.   Solicitar ayuda de inmediato si no mejora o si empeora.  Document Released: 10/03/2011 [employer] Patient Information 2012 [employer].  

## 2011-12-30 NOTE — Progress Notes (Signed)
NST today reactive. GC, CT, GBS done today. Schedule [place] at 38 weeks.

## 2011-12-30 NOTE — Progress Notes (Signed)
U/S scheduled 01/02/12 at 930am.

## 2011-12-30 NOTE — Progress Notes (Signed)
P-75 

## 2012-01-01 NOTE — Progress Notes (Signed)
NST on 12/26/11 is reactive

## 2012-01-02 ENCOUNTER — Ambulatory Visit (HOSPITAL_COMMUNITY)
Admission: RE | Admit: 2012-01-02 | Discharge: 2012-01-02 | Disposition: A | Discharge status: Home / Self Care | Payer: Self-pay | Source: Ambulatory Visit | Attending: Obstetrics & Gynecology | Admitting: Obstetrics & Gynecology

## 2012-01-02 DIAGNOSIS — Z3689 Encounter for other specified antenatal screening: Secondary | ICD-10-CM | POA: Insufficient documentation

## 2012-01-02 DIAGNOSIS — O10019 Pre-existing essential hypertension complicating pregnancy, unspecified trimester: Secondary | ICD-10-CM | POA: Insufficient documentation

## 2012-01-02 LAB — CULTURE, BETA STREP (GROUP B ONLY)

## 2012-01-02 LAB — STREP B DNA PROBE: GBS: NEGATIVE

## 2012-01-03 ENCOUNTER — Ambulatory Visit (INDEPENDENT_AMBULATORY_CARE_PROVIDER_SITE_OTHER): Payer: Self-pay | Admitting: *Deleted

## 2012-01-03 VITALS — BP 124/87

## 2012-01-03 DIAGNOSIS — O10019 Pre-existing essential hypertension complicating pregnancy, unspecified trimester: Secondary | ICD-10-CM

## 2012-01-03 NOTE — Progress Notes (Signed)
NST performed on 01/03/2012 was reviewed and was found to be reactive.  Continue recommended antenatal testing and prenatal care.

## 2012-01-03 NOTE — Progress Notes (Signed)
P = 77 

## 2012-01-06 ENCOUNTER — Ambulatory Visit (INDEPENDENT_AMBULATORY_CARE_PROVIDER_SITE_OTHER): Payer: Self-pay | Admitting: Family Medicine

## 2012-01-06 DIAGNOSIS — O10019 Pre-existing essential hypertension complicating pregnancy, unspecified trimester: Secondary | ICD-10-CM

## 2012-01-06 DIAGNOSIS — I10 Essential (primary) hypertension: Secondary | ICD-10-CM

## 2012-01-06 LAB — POCT URINALYSIS DIP (DEVICE)
Bilirubin Urine: NEGATIVE
Hgb urine dipstick: NEGATIVE
Nitrite: NEGATIVE
pH: 6.5 (ref 5.0–8.0)

## 2012-01-06 NOTE — Progress Notes (Signed)
U/S 3/7--vtx, 3074 gms-66%, AFI 14.53 NST reviewed and reactive.

## 2012-01-06 NOTE — Progress Notes (Signed)
Pulse: 85

## 2012-01-06 NOTE — Patient Instructions (Signed)
Eleccin del mtodo anticonceptivo  (Contraception Choices) La anticoncepcin (control de la natalidad) es el uso de cualquier mtodo o dispositivo para evitar el embarazo. A continuacin se indican algunos de esos mtodos.  MTODOS HORMONALES   Implante anticonceptivo. Es un tubo plstico delgado que contiene la hormona progesterona. No contiene estrgenos. El mdico inserta el tubo en la parte interna del brazo. El tubo puede permanecer en el lugar durante 3 aos. Despus de los 3 aos debe retirarse. El implante impide que los ovarios liberen vulos (ovulacin), espesa el moco cervical, lo que evita que los espermatozoides ingresen al tero y hace ms delgada la membrana que cubre el interior del tero.   Inyecciones de progesterona sola. Estas inyecciones se administran cada 3 meses para evitar el embarazo. La progesterona sinttica impide que los ovarios liberen vulos. Tambin hace que el moco cervical se espese y modifica el recubrimiento interno del tero. Esto hace ms difcil que los espermatozoides sobrevivan en el tero.   Pldoras anticonceptivas. Las pldoras anticonceptivas contienen estrgenos y progesterona. Actan impidiendo que el vulo se forme en el ovario(ovulacin). Las pldoras anticonceptivas son recetadas por el mdico.Tambin se utilizan para tratar los perodos menstruales abundantes.   Minipldora. Este tipo de pldora anticonceptiva contiene slo hormona progesterona. Deben tomarse todos los das del mes y debe recetarlas el mdico.   Parches anticonceptivos. El parche contiene hormonas similares a las que contienen las pldoras anticonceptivas. Deben cambiarse una vez por semana y se utilizan bajo prescripcin mdica.   Anillo vaginal. Anillo vaginal contiene hormonas similares a las que contienen las pldoras anticonceptivas. Se deja colocado durante tres semanas, se lo retira durante 1 semana y luego se coloca uno nuevo. La paciente debe sentirse cmoda para insertar  y retirar el anillo de la vagina.Es necesaria la receta del mdico.   Anticonceptivos de emergencia. Los anticonceptivos de emergencia son mtodos para evitar un embarazo despus de una relacin sexual sin proteccin. Esta pldora puede tomarse inmediatamente despus de tener relaciones sexuales o hasta 5 das de haber tenido sexo sin proteccin. Es ms efectiva si se toma poco tiempo despus. Los anticonceptivos de emergencia estn disponibles sin prescripcin mdica. Consltelo con su farmacutico. No use los anticonceptivos de emergencia como nico mtodo anticonceptivo.  MTODOS DE BARRERA   Condn masculino. Es una vaina delgada (ltex o goma) que se usa en el pene durante el acto sexual. Puede usarse con espermicida para aumentar la efectividad.   Condn femenino. Es una vaina blanda y floja que se adapta suavemente a la vagina antes de las relaciones sexuales.   Diafragma. Es una barrera de ltex redonda y suave que debe ser ajustada por un profesional. Se inserta en la vagina, junto con un gel espermicida. Debe insertarse antes de tener relaciones sexuales. Debe dejar el diafragma colocado en la vagina durante 6 a 8 horas despus de la relacin sexual.   Capuchn cervical. Es una taza de ltex o plstico, redonda y suave que cubre el cuello del tero y debe ser ajustada por un mdico. Puede dejarlo colocado en la vagina hasta 48 horas despus de las relaciones sexuales.   Esponja. Es una pieza blanda y circular de espuma de poliuretano. Contiene un espermicida. Se inserta en la vagina despus de mojarla y antes de las relaciones sexuales.   Espermicidas. Los espermicidas son qumicos que matan o bloquean el esperma y no lo dejan ingresar al cuello del tero y al tero. Vienen en forma de cremas, geles, supositorios, espuma o comprimidos. No es   necesario tener receta mdica. Se insertan en la vagina con un aplicador antes de tener relaciones sexuales. El proceso debe repetirse cada vez que  tiene relaciones sexuales.  ANTICONCEPTIVOS INTRAUTERINOS   Dispositivo intrauterino (DIU). Es un dispositivo en forma de T que se coloca en el tero durante el perodo menstrual, para evitar el embarazo. Hay dos tipos:   DIU de cobre. Este tipo de DIU est recubierto con un alambre de cobre y se inserta dentro del tero. El cobre hace que el tero y las trompas de Falopio produzcan un liquido que destruye los espermatozoides. Puede permanecer colocado durante 10 aos.   DIU hormonal. Este tipo de DIU contiene la hormona progestina (progesterona sinttica). La hormona espesa el moco cervical y evita que los espermatozoides ingresen al tero y tambin afina la membrana que cubre el tero para evitar la implantacin del vulo fertilizado. La hormona debilita o destruye los espermatozoides que ingresan al tero. Puede permanecer colocado durante 5 aos.  MTODOS ANTICONCEPTIVOS PERMANENTES   Ligadura de trompas en la mujer. La ligadura de trompas en la mujer se realiza sellando, atando u obstruyendo quirrgicamente las trompas de Falopio lo que impide que el vulo descienda hacia el tero.   Esterilizacin masculina. Se realiza atando los conductos por los que pasan los espermatozoides (vasectoma).Esto impide que el esperma ingrese a la vagina durante el acto sexual. Luego del procedimiento, el hombre puede eyacular lquido (semen).  MTODOS DE PLANIFICACIN NATURAL   Planificacin familiar natural.  Consiste en no tener relaciones sexuales o usar un mtodo de barrera (condn, diafragma, capuchn cervical) en los das que la mujer podra quedar embarazada.   Mtodo calendario.  Consiste en el seguimiento de la duracin de cada ciclo menstrual y la identificacin de los perodos frtiles.   Mtodo de la ovulacin.  Consiste en evitar las relaciones sexuales durante la ovulacin.   Mtodo sintotrmico. Consiste en evitar las relaciones sexuales en la poca en la que se est ovulando, utilizando un  termmetro y tendiendo en cuenta los sntomas de la ovulacin.   Mtodo post-ovulacin. Consiste en planificar las relaciones sexuales para despus de haber ovulado.  Independientemente del tipo o mtodo anticonceptivo que usted elija, es importante que use condones para protegerse contra las enfermedades de transmisin sexual (ETS). Hable con su mdico con respecto a qu mtodo anticonceptivo es el ms apropiado para usted.  Document Released: 10/14/2005 Document Revised: 10/03/2011 [employer] Patient Information 2012 [employer].Amamantar al beb (Breastfeeding) LOS BENEFICIOS DE AMAMANTAR Para el beb  La primera leche (calostro ) ayuda al mejor funcionamiento del sistema digestivo del beb.   La leche tiene anticuerpos que provienen de la madre y que ayudan a prevenir las infecciones en el beb.   Hay una menor incidencia de asma, enfermedades alrgicas y SMSI (sndrome de muerte sbita nfantil).   Los nutrientes que contiene la leche materna son mejores que las frmulas para el bibern y favorecen el desarrollo cerebral.   Los bebs amamantados sufren menos gases, clicos y constipacin.  Para la mam  La lactancia materna favorece el desarrollo de un vnculo muy especial entre la madre y el beb.   Es ms conveniente, siempre disponible a la temperatura adecuada y ms econmica que la leche maternizada.   Consume caloras en la madre y la ayuda a perder el peso ganado durante el embarazo.   Favorece la contraccin del tero a su tamao normal, de manera ms rpida y disminuye las hemorragias luego del parto.   Las madres que   amamantan tienen menor riesgo de desarrollar cncer de mama.  AMAMNTELO CON FRECUENCIA  Un beb sano, nacido a trmino, puede amamantarse con tanta frecuencia como cada hora, o espaciar las comidas cada tres horas.   Esta frecuencia variar de un beb a otro. Observe al beb cuando manifieste signos de hambre, antes que regirse por el reloj.    Amamntelo tan seguido como el beb lo solicite, o cuando usted sienta la necesidad de aliviar sus mamas.   Despierte al beb si han pasado 3  4 horas desde la ltima comida.   El amamantamiento frecuente la ayudar a producir ms leche y a prevenir problemas de dolor en los pezones e hinchazn de las mamas.  LA POSICIN DEL BEB PARA AMAMANTARLO  Ya sea que se encuentre acostada o sentada, asegrese que el abdomen del beb enfrente el suyo.   Sostenga la mama con el pulgar por arriba y el resto de los dedos por debajo. Asegrese que sus dedos se encuentren lejos del pezn y de la boca del beb.   Toque suavemente los labios del beb y la mejilla ms cercana a la mama con el dedo o el pezn.   Cuando la boca del beb se abra lo suficiente, introduzca el pezn y la zona oscura que lo rodea tanto como le sea posible dentro de la boca.   Coloque a beb cerca suyo de modo que su nariz y mejillas toquen las mamas al mamar.  LAS COMIDAS  La duracin de cada comida vara de un beb a otro y de una comida a otra.   El beb debe succionar alrededor de dos o tres minutos para que le llegue leche. Esto se denomina "bajada". Por este motivo, permita que el nio se alimente en cada mama todo lo que desee. Terminar de mamar cuando haya recibido la cantidad adecuada de nutrientes.   Para detener la succin coloque su dedo en la comisura de la boca del nio y deslcelo entre sus encas antes de quitarle la mama de la boca. Esto la ayudar a evitar el dolor en los pezones.  REDUCIR LA CONGESTIN DE LAS MAMAS  Durante la primera semana despus del parto, usted puede experimentar congestin en las mamas. Cuando las mamas estn congestionadas, se sienten calientes, llenas y molestas al tacto. Puede reducir la congestin si:   Lo amamanta frecuentemente, cada 2-3 horas. Las mams que amamantan pronto y con frecuencia tienen menos problemas de congestin.   Coloque bolsas fras livianas entre cada  mamada. Esto ayuda a reducir la hinchazn. Envuelva las bolsas de hielo en una toalla liviana para proteger su piel.   Aplique compresas hmedas calientes sobre la mama durante 5 a 10 minutos antes de amamantar al nio. Esto aumenta la circulacin y ayuda a que la leche fluya.   Masajee suavemente la mama antes y durante la alimentacin.   Asegrese que el nio vaca al menos una mama antes de cambiar de lado.   Use un sacaleche para vaciar la mama si el beb se duerme o no se alimenta bien. Tambin podr quitarse la leche con esta bomba si tiene que volver al trabajo o siente que las mamas estn congestionadas.   Evite los biberones, chupetes o complementar la alimentacin con agua o jugos en lugar de la leche materna.   Verifique que el beb se encuentra en la posicin correcta mientras lo alimenta.   Evite el cansancio, el estrs y la anemia   Use un soutien que sostenga bien sus   mamas y evite los que tienen aro.   Consuma una dieta balanceada y beba lquidos en cantidad.  Si sigue estas indicaciones, la congestin debe mejorar en 24 a 48 horas. Si an tiene dificultades, consulte a su asesor en lactancia. TENDR SUFICIENTE LECHE MI BEB? Algunas veces las madres se preocupan acerca de si sus bebs tendrn la leche suficiente. Puede asegurarse que el beb tiene la leche suficiente si:  El beb succiona y escucha que traga activamente.   El nio se alimenta al menos 8 a 12 veces en 24 horas. Alimntelo hasta que se desprenda por sus propios medios o se quede dormido en la primera mama (al menos durante 10 a 20 minutos), luego ofrzcale el otro lado.   El beb moja 5 a 6 paales descartables (6 a 8 paales de tela) en 24 horas cuando tiene 5  6 das de vida.   Tiene al menos 2-3 deposiciones todos los das en los primeros meses. La leche materna es todo el alimento que el beb necesita. No es necesario que el nio ingiera agua o preparados de bibern. De hecho, para ayudar a que sus  mamas produzcan ms leche, lo mejor es no darle al beb suplementos durante las primeras semanas.   La materia fecal debe ser blanda y amarillenta.   El beb debe aumentar 112 a 196 g por semana.  CUDESE Cuide sus mamas del siguiente modo:  Bese o dchese diariamente.   No lave sus pezones con jabn.   Comience a amamantar del lado izquierdo en una comida y del lado derecho en la siguiente.   Notar que aumenta el suministro de leche a los 2 a 5 das despus del parto. Puede sentir algunas molestias por la congestin, lo que hace que sus mamas estn duras y sensibles. La congestin disminuye en 24 a 48 horas. Mientras tanto, aplique toallas hmedas calientes durante 5 a 10 minutos antes de amamantar. Un masaje suave y la extraccin de un poco de leche antes de amamantar ablandarn las mamas y har ms fcil que el beb se agarre. Use un buen sostn y seque al aire los pezones durante 10 a 15 minutos luego de cada alimentacin.   Solo utilice apsitos de algodn.   Utilice lanolina pura sobre los pezones luego de amamantar. No necesita lavarlos luego de alimentar al nio.  Cudese del siguiente modo:   Consuma alimentos bien balanceados y refrigerios nutritivos.   Beba leche, jugos de fruta y agua para satisfacer la sed (alrededor de 8 vasos por da).   Descanse lo suficiente.   Aumente la ingesta de calcio en la dieta (1200mg/da).   Evite los alimentos que usted nota que puedan afectar al beb.  SOLICITE ATENCIN MDICA SI:  Tiene preguntas que formular o dificultades con la alimentacin a pecho.   Necesita ayuda.   Observa una zona dura, roja y que le duele en la zona de la mama, y se acompaa de fiebre de 100.5 F (38.1 C) o ms.   El beb est muy somnoliento como para alimentarse bien o tiene problemas para dormir.   El beb moja menos de 6 paales por da, a partir de los 5 das de vida.   La piel del beb o la parte blanca de sus ojos est ms amarilla de lo que  estaba en el hospital.   Se siente deprimida.  Document Released: 10/14/2005 Document Revised: 10/03/2011 [employer] Patient Information 2012 [employer]. 

## 2012-01-07 ENCOUNTER — Telehealth (HOSPITAL_COMMUNITY): Payer: Self-pay | Admitting: *Deleted

## 2012-01-07 ENCOUNTER — Encounter (HOSPITAL_COMMUNITY): Payer: Self-pay | Admitting: *Deleted

## 2012-01-07 NOTE — Telephone Encounter (Signed)
Preadmission screen  

## 2012-01-09 ENCOUNTER — Ambulatory Visit (INDEPENDENT_AMBULATORY_CARE_PROVIDER_SITE_OTHER): Payer: Self-pay | Admitting: *Deleted

## 2012-01-09 VITALS — BP 128/85 | Wt 193.6 lb

## 2012-01-09 DIAGNOSIS — O10019 Pre-existing essential hypertension complicating pregnancy, unspecified trimester: Secondary | ICD-10-CM

## 2012-01-09 LAB — FETAL NONSTRESS TEST

## 2012-01-09 NOTE — Progress Notes (Signed)
NST performed on 01/09/2012 was reviewed and was found to be reactive.  Continue recommended antenatal testing and prenatal care. 

## 2012-01-09 NOTE — Progress Notes (Signed)
P = 87 

## 2012-01-12 ENCOUNTER — Inpatient Hospital Stay (HOSPITAL_COMMUNITY)
Admission: AD | Admit: 2012-01-12 | Discharge: 2012-01-13 | DRG: 774 | Disposition: A | Discharge status: Home / Self Care | Payer: Medicaid Other | Source: Ambulatory Visit | Attending: Obstetrics & Gynecology | Admitting: Obstetrics & Gynecology

## 2012-01-12 ENCOUNTER — Encounter (HOSPITAL_COMMUNITY): Payer: Self-pay

## 2012-01-12 ENCOUNTER — Inpatient Hospital Stay (HOSPITAL_COMMUNITY)
Admission: AD | Admit: 2012-01-12 | Discharge: 2012-01-12 | Disposition: A | Discharge status: Home / Self Care | Payer: Medicaid Other | Source: Ambulatory Visit | Attending: Family Medicine | Admitting: Family Medicine

## 2012-01-12 ENCOUNTER — Encounter (HOSPITAL_COMMUNITY): Payer: Self-pay | Admitting: Obstetrics and Gynecology

## 2012-01-12 DIAGNOSIS — O479 False labor, unspecified: Secondary | ICD-10-CM | POA: Insufficient documentation

## 2012-01-12 DIAGNOSIS — O1002 Pre-existing essential hypertension complicating childbirth: Principal | ICD-10-CM | POA: Diagnosis present

## 2012-01-12 DIAGNOSIS — O10019 Pre-existing essential hypertension complicating pregnancy, unspecified trimester: Secondary | ICD-10-CM | POA: Insufficient documentation

## 2012-01-12 LAB — COMPREHENSIVE METABOLIC PANEL
CO2: 20 mEq/L (ref 19–32)
Calcium: 9.2 mg/dL (ref 8.4–10.5)
Creatinine, Ser: 0.56 mg/dL (ref 0.50–1.10)
GFR calc Af Amer: >90 mL/min (ref >90)
GFR calc non Af Amer: >90 mL/min (ref >90)
Glucose, Bld: 98 mg/dL (ref 70–99)

## 2012-01-12 LAB — CBC
Hemoglobin: 10.5 g/dL — ABNORMAL LOW (ref 12.0–15.0)
MCH: 30.2 pg (ref 26.0–34.0)
MCV: 90.8 fL (ref 78.0–100.0)
Platelets: 199 10*3/uL (ref 150–400)
RBC: 3.48 MIL/uL — ABNORMAL LOW (ref 3.87–5.11)

## 2012-01-12 MED ORDER — LANOLIN HYDROUS EX OINT: TOPICAL_OINTMENT | CUTANEOUS | Status: DC | PRN

## 2012-01-12 MED ORDER — IBUPROFEN 600 MG PO TABS
600.0000 mg | ORAL_TABLET | Freq: Four times a day (QID) | ORAL | Status: DC
  Administered 2012-01-12 – 2012-01-13 (×4): 600 mg via ORAL
  Filled 2012-01-12 (×4): qty 1

## 2012-01-12 MED ORDER — ONDANSETRON HCL 4 MG/2ML IJ SOLN: 4.0000 mg | Freq: Four times a day (QID) | INTRAMUSCULAR | Status: DC | PRN

## 2012-01-12 MED ORDER — CITRIC ACID-SODIUM CITRATE 334-500 MG/5ML PO SOLN: 30.0000 mL | ORAL | Status: DC | PRN

## 2012-01-12 MED ORDER — LIDOCAINE HCL (PF) 1 % IJ SOLN
30.0000 mL | INTRAMUSCULAR | Status: DC | PRN
  Filled 2012-01-12: qty 30

## 2012-01-12 MED ORDER — ZOLPIDEM TARTRATE 5 MG PO TABS: 5.0000 mg | ORAL_TABLET | Freq: Every evening | ORAL | Status: DC | PRN

## 2012-01-12 MED ORDER — IBUPROFEN 600 MG PO TABS: 600.0000 mg | ORAL_TABLET | Freq: Four times a day (QID) | ORAL | Status: DC | PRN

## 2012-01-12 MED ORDER — PRENATAL MULTIVITAMIN CH
1.0000 | ORAL_TABLET | Freq: Every day | ORAL | Status: DC
  Administered 2012-01-13: 1 via ORAL
  Filled 2012-01-12: qty 1

## 2012-01-12 MED ORDER — ACETAMINOPHEN 325 MG PO TABS: 650.0000 mg | ORAL_TABLET | ORAL | Status: DC | PRN

## 2012-01-12 MED ORDER — ONDANSETRON HCL 4 MG/2ML IJ SOLN: 4.0000 mg | INTRAMUSCULAR | Status: DC | PRN

## 2012-01-12 MED ORDER — ZOLPIDEM TARTRATE 5 MG PO TABS
5.0000 mg | ORAL_TABLET | Freq: Once | ORAL | Status: AC
  Administered 2012-01-12: 5 mg via ORAL
  Filled 2012-01-12: qty 1

## 2012-01-12 MED ORDER — NALBUPHINE SYRINGE 5 MG/0.5 ML
5.0000 mg | INJECTION | INTRAMUSCULAR | Status: DC | PRN
  Administered 2012-01-12: 5 mg via INTRAVENOUS
  Filled 2012-01-12: qty 0.5

## 2012-01-12 MED ORDER — TETANUS-DIPHTH-ACELL PERTUSSIS 5-2.5-18.5 LF-MCG/0.5 IM SUSP
0.5000 mL | Freq: Once | INTRAMUSCULAR | Status: DC
  Filled 2012-01-12: qty 0.5

## 2012-01-12 MED ORDER — WITCH HAZEL-GLYCERIN EX PADS: 1.0000 "application " | MEDICATED_PAD | CUTANEOUS | Status: DC | PRN

## 2012-01-12 MED ORDER — OXYCODONE-ACETAMINOPHEN 5-325 MG PO TABS: 1.0000 | ORAL_TABLET | ORAL | Status: DC | PRN

## 2012-01-12 MED ORDER — ZOLPIDEM TARTRATE 10 MG PO TABS: 10.0000 mg | ORAL_TABLET | Freq: Once | ORAL | Status: DC

## 2012-01-12 MED ORDER — METHYLDOPA 250 MG PO TABS
250.0000 mg | ORAL_TABLET | Freq: Two times a day (BID) | ORAL | Status: DC
  Administered 2012-01-12 (×2): 250 mg via ORAL
  Filled 2012-01-12 (×5): qty 1

## 2012-01-12 MED ORDER — BENZOCAINE-MENTHOL 20-0.5 % EX AERO: 1.0000 "application " | INHALATION_SPRAY | CUTANEOUS | Status: DC | PRN

## 2012-01-12 MED ORDER — NALOXONE HCL 0.4 MG/ML IJ SOLN
INTRAMUSCULAR | Status: AC
  Filled 2012-01-12: qty 1

## 2012-01-12 MED ORDER — SIMETHICONE 80 MG PO CHEW: 80.0000 mg | CHEWABLE_TABLET | ORAL | Status: DC | PRN

## 2012-01-12 MED ORDER — LACTATED RINGERS IV SOLN
INTRAVENOUS | Status: DC
  Administered 2012-01-12: 11:00:00 via INTRAVENOUS

## 2012-01-12 MED ORDER — SENNOSIDES-DOCUSATE SODIUM 8.6-50 MG PO TABS
2.0000 | ORAL_TABLET | Freq: Every day | ORAL | Status: DC
  Administered 2012-01-12: 2 via ORAL

## 2012-01-12 MED ORDER — DIBUCAINE 1 % RE OINT: 1.0000 "application " | TOPICAL_OINTMENT | RECTAL | Status: DC | PRN

## 2012-01-12 MED ORDER — OXYTOCIN 20 UNITS IN LACTATED RINGERS INFUSION - SIMPLE
125.0000 mL/h | Freq: Once | INTRAVENOUS | Status: AC
  Administered 2012-01-12: 999 mL/h via INTRAVENOUS

## 2012-01-12 MED ORDER — DIPHENHYDRAMINE HCL 25 MG PO CAPS: 25.0000 mg | ORAL_CAPSULE | Freq: Four times a day (QID) | ORAL | Status: DC | PRN

## 2012-01-12 MED ORDER — LACTATED RINGERS IV SOLN: 500.0000 mL | INTRAVENOUS | Status: DC | PRN

## 2012-01-12 MED ORDER — OXYTOCIN BOLUS FROM INFUSION
500.0000 mL | Freq: Once | INTRAVENOUS | Status: DC
  Filled 2012-01-12: qty 1000
  Filled 2012-01-12: qty 500

## 2012-01-12 MED ORDER — FLEET ENEMA 7-19 GM/118ML RE ENEM: 1.0000 | ENEMA | RECTAL | Status: DC | PRN

## 2012-01-12 MED ORDER — ONDANSETRON HCL 4 MG PO TABS: 4.0000 mg | ORAL_TABLET | ORAL | Status: DC | PRN

## 2012-01-12 NOTE — Discharge Instructions (Signed)
Contracciones de Braxton Hicks (Braxton Hicks Contractions) Usted presenta un falso trabajo de parto. Durante todo el embarazo aparecen con frecuencia contracciones del tero. Hacia el final del embarazo (32-34 semanas) estas contracciones (Braxton Hicks) pueden hacerse ms fuertes. No se trata de un trabajo de parto verdadero porque no producen un agrandamiento (dilatacin) y afinamiento del cuello del tero. Algunas veces resulta difcil distinguirlas del trabajo de parto verdadero porque en algunos casos llegan a ser muy intensas y las personas tienen distinta tolerancia al dolor. No debe sentirse avergonzada si ingresa al hospital con un falso trabajo de parto. En ocasiones la nica forma de saber si est en un parto verdadero es observar los cambios en el cuello del tero. A veces, la nica forma de saber si realmente est en trabajo de parto es para el mdico observar los cambios en el tero. Como diferenciar el trabajo de parto falso del verdadero:  Trabajos de parto falso.   Las contracciones falsas generalmente duran menos y no son tan intensas como las verdaderas.   Generalmente se sienten en la zona inferior del abdomen y en la ingle.   Pueden aliviarse con una caminata o cambiar de posicin mientras se est acostada.   A medida que pasa el tiempo son ms cortas y dbiles.   Generalmente son irregulares.   No se hacen progresivamente ms intensas y cercanas entre s como las verdaderas.   Trabajo de parto verdadero.   Las contracciones verdaderas duran de 30 a 70 segundos, son ms regulares, generalmente se hacen ms intensas y aumentan en frecuencia.   No desaparecen al caminar.   La molestia generalmente se siente en la parte superior del tero y se extiende hacia la zona inferior del abdomen y hacia la cintura.   El profesional que la asiste podr examinarla para determinar si el trabajo de parto es verdadero. El examen mostrar si el cuello del tero se est dilatando y  afinando.  Si no hay problemas prenatales u otras complicaciones de la salud asociadas al embarazo, no habr inconvenientes si la envan a su casa y espera el comienzo del verdadero trabajo de parto. INSTRUCCIONES PARA EL CUIDADO DOMICILIARIO  Siga con los ejercicios y las indicaciones habituales.   Tome los medicamentos como se le indic.   Cumpla con las citas regularmente.   Coma y beba ligero si cree que dar a luz.   Si se siente incmoda por las contracciones:   Cambie de actividad, si est acostada o en reposo, camine y si est caminando, repose.   Sintense y repose en una baadera con agua caliente.   Beba entre 2 y 3 vasos de agua. La deshidratacin puede causar contracciones BH.   Respire lenta y profundamente varias veces por hora.  SOLICITE ATENCIN MDICA DE INMEDIATO SI:  Las contracciones se intensifican, se hacen ms regulares y cercanas entre s.   Tiene una prdida importante de lquido de la vagina   La temperatura oral se eleva sin motivo por encima de 102 F (38.9 C) o segn le indique el profesional que la asiste.   Elimina una mucosidad sanguinolenta.   Presenta hemorragia vaginal.   Presenta dolor abdominal constante.   Siente un dolor en la parte baja de la espalda que nunca haba sentido antes.   Siente que el beb empuja hacia abajo y le causa presin plvica.   El beb no se mueve tanto como antes.  Document Released: 07/24/2005 Document Revised: 10/03/2011 [employer] Patient Information 2012 [employer]. 

## 2012-01-12 NOTE — MAU Note (Signed)
Patient is in for labor eval. Denies any vaginal bleeding, lof. Reports good fetal movement. Ctx q22m since midnight.

## 2012-01-12 NOTE — H&P (Signed)
Michelle Christian is a 26 y.o. female presenting for spontaneous onset of labor.  Patient reports that her water broke at approximately 10am and she is having regular contractions, every 3-5 minutes, that are significantly painful.  Pregnancy is significant for cHTN.  Patient has not yet taken her medication this AM.  She is very uncomfortable.  +FM. +SROM, no vaginal bleeding.  Denies headache, RUQ pain, significant edema.   History OB History    Grav Para Term Preterm Abortions TAB SAB Ect Mult Living   2 1 1       1      Past Medical History  Diagnosis Date  . Hypertension    Past Surgical History  Procedure Date  . No past surgeries    Family History: family history includes Hypertension in her maternal grandfather and maternal grandmother.  There is no history of Anesthesia problems, and Hypotension, and Malignant hyperthermia, and Pseudochol deficiency, . Social History:  reports that she has never smoked. She does not have any smokeless tobacco history on file. She reports that she does not drink alcohol or use illicit drugs.  ROS Negative unless otherwise stated.   Dilation: 5 Effacement (%): 90 Station: -2 Exam by:: J. Rasch RN   Blood pressure 149/86, pulse 110, temperature 97.2 F (36.2 C), temperature source Oral, resp. rate 20, last menstrual period 04/17/2011. Exam Physical Exam  Gen: Uncomfortable with contractions. CV: RRR Pulm: clear Abd: gravid  Prenatal labs: ABO, Rh: A/Positive/-- (03/01 0000) Antibody: Negative (03/01 0000) Rubella: Immune (11/13 0000) RPR: Nonreactive, Nonreactive (11/13 0000)  HBsAg: Negative (11/13 0000)  HIV: NON REACTIVE (01/07 1148)  GBS: Negative (03/07 0000)   Assessment/Plan: 26 yo G2P1001 at 38.4 presents with SOOL, including SROM at 10am.  -Admit to L&D. -Will give pt her home BP meds and check preE labs. -Patient would like IV pain meds, denies desire for epidural at this time. -Anticipate NSVD.    Pranit Owensby 01/12/2012, 10:40 AM

## 2012-01-12 NOTE — MAU Note (Signed)
Pt reprots having strong contractions. Reports she thinks her water may have broken.

## 2012-01-13 ENCOUNTER — Other Ambulatory Visit: Payer: Self-pay

## 2012-01-13 MED ORDER — IBUPROFEN 600 MG PO TABS: 600.0000 mg | ORAL_TABLET | Freq: Four times a day (QID) | ORAL | Status: AC

## 2012-01-13 MED ORDER — ACETAMINOPHEN-CODEINE 300-30 MG PO TABS: 1.0000 | ORAL_TABLET | ORAL | Status: DC | PRN

## 2012-01-13 NOTE — Progress Notes (Signed)
Post Partum Day 1 Subjective: no complaints and + flatus  Objective: Blood pressure 127/87, pulse 64, temperature 98.4 F (36.9 C), temperature source Oral, resp. rate 18, height 5\' 3"  (1.6 m), weight 87.998 kg (194 lb), last menstrual period 04/17/2011, SpO2 98.00%, unknown if currently breastfeeding.  Physical Exam:  General: alert, cooperative and appears stated age Lochia: appropriate Uterine Fundus:soft DVT Evaluation: No evidence of DVT seen on physical exam. Negative Homan's sign. No cords or calf tenderness. No significant calf/ankle edema.   Basename 01/12/12 1100  HGB 10.5*  HCT 31.6*    Assessment/Plan: Breastfeeding and Contraception ?depo    LOS: 1 day   Iverson Alamin 01/13/2012, 7:30 AM

## 2012-01-13 NOTE — Progress Notes (Signed)
UR chart review completed.  

## 2012-01-13 NOTE — Progress Notes (Signed)
PGY-1 Addendum  I saw and examined patient with interpreter Alex. Mom is doing well. No concerns. Will have lactation see patient today. Patient also interested in early discharge.  Agree with PA Student Dunn's assessment and plan  Michelle Christian 01/13/2012 8:10 AM

## 2012-01-13 NOTE — Discharge Summary (Signed)
Obstetric Discharge Summary Reason for Admission: onset of labor and rupture of membranes Prenatal Procedures: NST, Preeclampsia and ultrasound Intrapartum Procedures: spontaneous vaginal delivery Postpartum Procedures: none Complications-Operative and Postpartum: none Hemoglobin  Date Value Range Status  01/12/2012 10.5* 12.0-15.0 (g/dL) Final     HCT  Date Value Range Status  01/12/2012 31.6* 36.0-46.0 (%) Final    Physical Exam:  General: alert, cooperative and appears stated age Lochia: appropriate Uterine Fundus: firm Incision: n/a DVT Evaluation: No evidence of DVT seen on physical exam. Negative Homan's sign.  Discharge Diagnoses: Term Pregnancy-delivered  Discharge Information: Date: 01/13/2012 Activity: pelvic rest Diet: routine Medications: Tylenol #3 and Ibuprofen Condition: stable Instructions: refer to practice specific booklet Discharge to: home Follow-up Information    Follow up with [facility] in 2 weeks. (Blood pressure check)          Newborn Data: Live born female  Birth Weight: 6 lb 8.6 oz (2965 g) APGAR: 8, 9  Home with mother.  [facility] 01/13/2012, 9:09 AM

## 2012-01-15 ENCOUNTER — Inpatient Hospital Stay (HOSPITAL_COMMUNITY): Admission: RE | Admit: 2012-01-15 | Payer: Self-pay | Source: Ambulatory Visit

## 2012-01-16 ENCOUNTER — Telehealth: Payer: Self-pay | Admitting: *Deleted

## 2012-01-16 NOTE — Telephone Encounter (Signed)
Called [facility] Nurse and left a message that she should call Attending doctor on call at [phone] to report abnormal BP and follow up on any orders.

## 2012-01-16 NOTE — Telephone Encounter (Signed)
[facility] nurse with [employer] called and reported that patients blood pressure is elevated at 140/92. Pt is asymptomatic. She is currently taking Aldomet 250 mg bid.

## 2012-01-17 NOTE — ED Provider Notes (Signed)
Attestation of Attending Supervision of Advanced Practitioner: Evaluation and management procedures were performed by the PA/NP/CNM/OB Fellow under my supervision/collaboration. Chart reviewed and agree with management and plan.  Malachai Schalk V 01/17/2012 4:41 PM    

## 2012-01-22 NOTE — Progress Notes (Signed)
NST 12/12/11 reactive  

## 2012-02-10 ENCOUNTER — Encounter: Payer: Self-pay | Admitting: Advanced Practice Midwife

## 2012-02-10 ENCOUNTER — Ambulatory Visit (INDEPENDENT_AMBULATORY_CARE_PROVIDER_SITE_OTHER): Payer: Self-pay | Admitting: Advanced Practice Midwife

## 2012-02-10 DIAGNOSIS — Z309 Encounter for contraceptive management, unspecified: Secondary | ICD-10-CM

## 2012-02-10 MED ORDER — MEDROXYPROGESTERONE ACETATE 150 MG/ML IM SUSP
150.0000 mg | INTRAMUSCULAR | Status: AC
  Administered 2012-05-11: 150 mg via INTRAMUSCULAR

## 2012-02-10 NOTE — Progress Notes (Signed)
  Subjective:     Michelle Christian is a 26 y.o. female who presents for a postpartum visit. She is 4 weeks postpartum following a spontaneous vaginal delivery. I have fully reviewed the prenatal and intrapartum course. The delivery was at 38.4 gestational weeks. Outcome: spontaneous vaginal delivery. Anesthesia: none. Postpartum course has been uneventful. Baby's course has been uneventful. Baby is feeding by breast. Bleeding staining only. Bowel function is normal. Bladder function is normal. Patient is not sexually active. Contraception method is Depo-Provera injections. Postpartum depression screening: negative.  The following portions of the patient's history were reviewed and updated as appropriate: allergies, current medications, past family history, past medical history, past social history, past surgical history and problem list.  Review of Systems Pertinent items are noted in HPI.   Objective:    BP 132/78  Pulse 98  Temp(Src) 96.8 F (36 C) (Oral)  Ht 5\' 1"  (1.549 m)  Wt 174 lb 4.8 oz (79.062 kg)  BMI 32.93 kg/m2  LMP 04/17/2011  Breastfeeding? Yes  General:  alert and no distress   Breasts:  inspection negative, no nipple discharge or bleeding, no masses or nodularity palpable  Lungs: n/a  Heart:  regular rate and rhythm, S1, S2 normal, no murmur, click, rub or gallop  Abdomen: soft, non-tender; bowel sounds normal; no masses,  no organomegaly   Vulva:  normal  Vagina: normal vagina  Cervix:  multiparous appearance  Corpus: normal  Adnexa:  normal adnexa  Rectal Exam: Not performed.        Assessment:    normal postpartum exam. Pap smear not done at today's visit.  Pt insists she had one done here, though I cannot find results.  Plan:    1. Contraception: Depo-Provera injections 2. Declines Pap smear.  3. Follow up in: when pap due  or as needed.

## 2012-02-10 NOTE — Patient Instructions (Signed)

## 2012-02-24 ENCOUNTER — Ambulatory Visit (INDEPENDENT_AMBULATORY_CARE_PROVIDER_SITE_OTHER): Payer: Self-pay

## 2012-02-24 VITALS — BP 133/96 | HR 102

## 2012-02-24 DIAGNOSIS — Z3049 Encounter for surveillance of other contraceptives: Secondary | ICD-10-CM

## 2012-02-24 MED ORDER — MEDROXYPROGESTERONE ACETATE 150 MG/ML IM SUSP
150.0000 mg | Freq: Once | INTRAMUSCULAR | Status: AC
  Administered 2012-02-24: 150 mg via INTRAMUSCULAR

## 2012-05-11 ENCOUNTER — Ambulatory Visit (INDEPENDENT_AMBULATORY_CARE_PROVIDER_SITE_OTHER): Payer: Self-pay | Admitting: General Practice

## 2012-05-11 VITALS — BP 114/82 | HR 102 | Temp 97.3°F | Ht 62.0 in | Wt 183.8 lb

## 2012-05-11 DIAGNOSIS — Z3049 Encounter for surveillance of other contraceptives: Secondary | ICD-10-CM

## 2012-05-11 DIAGNOSIS — Z309 Encounter for contraceptive management, unspecified: Secondary | ICD-10-CM

## 2012-07-27 ENCOUNTER — Ambulatory Visit (INDEPENDENT_AMBULATORY_CARE_PROVIDER_SITE_OTHER): Payer: Self-pay | Admitting: Obstetrics and Gynecology

## 2012-07-27 ENCOUNTER — Encounter: Payer: Self-pay | Admitting: Obstetrics and Gynecology

## 2012-07-27 VITALS — BP 117/79 | HR 92 | Wt 188.0 lb

## 2012-07-27 DIAGNOSIS — Z3049 Encounter for surveillance of other contraceptives: Secondary | ICD-10-CM

## 2012-07-27 MED ORDER — MEDROXYPROGESTERONE ACETATE 150 MG/ML IM SUSP
150.0000 mg | INTRAMUSCULAR | Status: DC
  Administered 2012-07-27: 150 mg via INTRAMUSCULAR

## 2012-10-15 ENCOUNTER — Ambulatory Visit (INDEPENDENT_AMBULATORY_CARE_PROVIDER_SITE_OTHER): Payer: Self-pay | Admitting: *Deleted

## 2012-10-15 VITALS — BP 128/87 | HR 85 | Temp 98.2°F | Wt 193.3 lb

## 2012-10-15 DIAGNOSIS — Z3049 Encounter for surveillance of other contraceptives: Secondary | ICD-10-CM

## 2012-10-15 MED ORDER — MEDROXYPROGESTERONE ACETATE 150 MG/ML IM SUSP
150.0000 mg | Freq: Once | INTRAMUSCULAR | Status: AC
  Administered 2012-10-15: 150 mg via INTRAMUSCULAR

## 2012-12-31 ENCOUNTER — Ambulatory Visit (INDEPENDENT_AMBULATORY_CARE_PROVIDER_SITE_OTHER): Payer: Self-pay

## 2012-12-31 VITALS — BP 128/100 | HR 78 | Temp 95.6°F | Wt 189.5 lb

## 2012-12-31 DIAGNOSIS — IMO0001 Reserved for inherently not codable concepts without codable children: Secondary | ICD-10-CM

## 2013-01-04 DIAGNOSIS — Z3049 Encounter for surveillance of other contraceptives: Secondary | ICD-10-CM

## 2013-01-04 MED ORDER — MEDROXYPROGESTERONE ACETATE 150 MG/ML IM SUSP
150.0000 mg | Freq: Once | INTRAMUSCULAR | Status: AC
  Administered 2013-01-04: 150 mg via INTRAMUSCULAR

## 2013-01-26 DIAGNOSIS — F32A Depression, unspecified: Secondary | ICD-10-CM

## 2013-01-26 HISTORY — DX: Depression, unspecified: F32.A

## 2013-03-09 ENCOUNTER — Encounter (HOSPITAL_COMMUNITY): Payer: Self-pay

## 2013-03-09 ENCOUNTER — Emergency Department (HOSPITAL_COMMUNITY)
Admission: EM | Admit: 2013-03-09 | Discharge: 2013-03-09 | Disposition: A | Discharge status: Other Acute Inpatient Hospital | Payer: Self-pay | Attending: Emergency Medicine | Admitting: Emergency Medicine

## 2013-03-09 ENCOUNTER — Inpatient Hospital Stay (HOSPITAL_COMMUNITY)
Admission: EM | Admit: 2013-03-09 | Discharge: 2013-03-12 | DRG: 885 | Disposition: A | Discharge status: Home / Self Care | Payer: No Typology Code available for payment source | Source: Intra-hospital | Attending: Psychiatry | Admitting: Psychiatry

## 2013-03-09 ENCOUNTER — Encounter (HOSPITAL_COMMUNITY): Payer: Self-pay | Admitting: Emergency Medicine

## 2013-03-09 DIAGNOSIS — R209 Unspecified disturbances of skin sensation: Secondary | ICD-10-CM | POA: Insufficient documentation

## 2013-03-09 DIAGNOSIS — F411 Generalized anxiety disorder: Secondary | ICD-10-CM | POA: Diagnosis present

## 2013-03-09 DIAGNOSIS — F329 Major depressive disorder, single episode, unspecified: Secondary | ICD-10-CM

## 2013-03-09 DIAGNOSIS — F339 Major depressive disorder, recurrent, unspecified: Principal | ICD-10-CM | POA: Diagnosis present

## 2013-03-09 DIAGNOSIS — I10 Essential (primary) hypertension: Secondary | ICD-10-CM

## 2013-03-09 DIAGNOSIS — R7989 Other specified abnormal findings of blood chemistry: Secondary | ICD-10-CM

## 2013-03-09 DIAGNOSIS — R45850 Homicidal ideations: Secondary | ICD-10-CM

## 2013-03-09 DIAGNOSIS — Z79899 Other long term (current) drug therapy: Secondary | ICD-10-CM

## 2013-03-09 DIAGNOSIS — R110 Nausea: Secondary | ICD-10-CM | POA: Insufficient documentation

## 2013-03-09 DIAGNOSIS — F3289 Other specified depressive episodes: Secondary | ICD-10-CM | POA: Insufficient documentation

## 2013-03-09 DIAGNOSIS — R945 Abnormal results of liver function studies: Secondary | ICD-10-CM

## 2013-03-09 DIAGNOSIS — Z3202 Encounter for pregnancy test, result negative: Secondary | ICD-10-CM | POA: Insufficient documentation

## 2013-03-09 DIAGNOSIS — R4589 Other symptoms and signs involving emotional state: Secondary | ICD-10-CM | POA: Insufficient documentation

## 2013-03-09 LAB — COMPREHENSIVE METABOLIC PANEL
ALT: 45 U/L — ABNORMAL HIGH (ref 0–35)
Alkaline Phosphatase: 81 U/L (ref 39–117)
Chloride: 104 mEq/L (ref 96–112)
GFR calc Af Amer: >90 mL/min (ref >90)
Glucose, Bld: 97 mg/dL (ref 70–99)
Potassium: 3.9 mEq/L (ref 3.5–5.1)
Sodium: 139 mEq/L (ref 135–145)
Total Bilirubin: 0.3 mg/dL (ref 0.3–1.2)
Total Protein: 7.4 g/dL (ref 6.0–8.3)

## 2013-03-09 LAB — RAPID URINE DRUG SCREEN, HOSP PERFORMED
Amphetamines: NOT DETECTED
Barbiturates: NOT DETECTED
Benzodiazepines: NOT DETECTED

## 2013-03-09 LAB — CBC
Hemoglobin: 13.1 g/dL (ref 12.0–15.0)
MCHC: 32.8 g/dL (ref 30.0–36.0)
RBC: 4.52 MIL/uL (ref 3.87–5.11)
WBC: 8.7 10*3/uL (ref 4.0–10.5)

## 2013-03-09 LAB — ETHANOL: Alcohol, Ethyl (B): <11 mg/dL (ref 0–11)

## 2013-03-09 LAB — ACETAMINOPHEN LEVEL: Acetaminophen (Tylenol), Serum: <15 ug/mL (ref 10–30)

## 2013-03-09 MED ORDER — PAROXETINE HCL 10 MG PO TABS
10.0000 mg | ORAL_TABLET | ORAL | Status: DC
  Administered 2013-03-10 – 2013-03-11 (×2): 10 mg via ORAL
  Filled 2013-03-09 (×3): qty 1

## 2013-03-09 MED ORDER — ACETAMINOPHEN 325 MG PO TABS: 650.0000 mg | ORAL_TABLET | Freq: Four times a day (QID) | ORAL | Status: DC | PRN

## 2013-03-09 MED ORDER — ALUM & MAG HYDROXIDE-SIMETH 200-200-20 MG/5ML PO SUSP: 30.0000 mL | ORAL | Status: DC | PRN

## 2013-03-09 MED ORDER — METHYLDOPA 250 MG PO TABS: 250.0000 mg | ORAL_TABLET | Freq: Two times a day (BID) | ORAL | Status: DC

## 2013-03-09 MED ORDER — METHYLDOPA 250 MG PO TABS
250.0000 mg | ORAL_TABLET | Freq: Two times a day (BID) | ORAL | Status: DC
  Administered 2013-03-10 – 2013-03-12 (×5): 250 mg via ORAL
  Filled 2013-03-09 (×7): qty 1

## 2013-03-09 MED ORDER — MAGNESIUM HYDROXIDE 400 MG/5ML PO SUSP: 30.0000 mL | Freq: Every day | ORAL | Status: DC | PRN

## 2013-03-09 MED ORDER — LORATADINE 10 MG PO TABS
10.0000 mg | ORAL_TABLET | Freq: Every day | ORAL | Status: DC | PRN
  Filled 2013-03-09: qty 10

## 2013-03-09 MED ORDER — PAROXETINE HCL 10 MG PO TABS
10.0000 mg | ORAL_TABLET | Freq: Every day | ORAL | Status: DC
  Administered 2013-03-09: 10 mg via ORAL
  Filled 2013-03-09 (×2): qty 1

## 2013-03-09 MED ORDER — LORAZEPAM 1 MG PO TABS
1.0000 mg | ORAL_TABLET | Freq: Two times a day (BID) | ORAL | Status: DC | PRN
  Administered 2013-03-09: 1 mg via ORAL
  Filled 2013-03-09: qty 1

## 2013-03-09 MED ORDER — NICOTINE 21 MG/24HR TD PT24: 21.0000 mg | MEDICATED_PATCH | Freq: Every day | TRANSDERMAL | Status: DC

## 2013-03-09 MED ORDER — MULTIVITAMINS PO CAPS: 1.0000 | ORAL_CAPSULE | Freq: Every day | ORAL | Status: DC

## 2013-03-09 MED ORDER — PAROXETINE HCL 10 MG PO TABS: 10.0000 mg | ORAL_TABLET | ORAL | Status: DC

## 2013-03-09 MED ORDER — IBUPROFEN 400 MG PO TABS: 600.0000 mg | ORAL_TABLET | Freq: Three times a day (TID) | ORAL | Status: DC | PRN

## 2013-03-09 MED ORDER — TEMAZEPAM 15 MG PO CAPS: 15.0000 mg | ORAL_CAPSULE | Freq: Every day | ORAL | Status: DC

## 2013-03-09 MED ORDER — ONDANSETRON HCL 4 MG PO TABS: 4.0000 mg | ORAL_TABLET | Freq: Three times a day (TID) | ORAL | Status: DC | PRN

## 2013-03-09 MED ORDER — ACETAMINOPHEN 325 MG PO TABS: 650.0000 mg | ORAL_TABLET | ORAL | Status: DC | PRN

## 2013-03-09 MED ORDER — DIPHENHYDRAMINE HCL 50 MG PO CAPS
50.0000 mg | ORAL_CAPSULE | Freq: Every evening | ORAL | Status: DC | PRN
  Administered 2013-03-10 – 2013-03-11 (×2): 50 mg via ORAL
  Filled 2013-03-09 (×6): qty 1

## 2013-03-09 NOTE — ED Provider Notes (Signed)
Medical screening examination/treatment/procedure(s) were performed by non-physician practitioner and as supervising physician I was immediately available for consultation/collaboration.  Leilyn Frayre L Deloise Marchant, MD 03/09/13 1633 

## 2013-03-09 NOTE — ED Notes (Signed)
Report given to [place] at [facility]. Awaiting Transport from security to [facility].

## 2013-03-09 NOTE — Tx Team (Addendum)
Initial Interdisciplinary Treatment Plan  PATIENT STRENGTHS: (choose at least two) Ability for insight Motivation for treatment/growth  PATIENT STRESSORS: Patient reports 25 mth old child and crying spells     PROBLEM LIST: Problem List/Patient Goals Date to be addressed Date deferred Reason deferred Estimated date of resolution  Depression  03/09/13                                                      DISCHARGE CRITERIA:  Ability to meet basic life and health needs Adequate post-discharge living arrangements Improved stabilization in mood, thinking, and/or behavior Need for constant or close observation no longer present Verbal commitment to aftercare and medication compliance  PRELIMINARY DISCHARGE PLAN: Return to previous living arrangement  PATIENT/FAMIILY INVOLVEMENT: This treatment plan has been presented to and reviewed with the patient, Michelle Christian, and/or family member.  The patient and family have been given the opportunity to ask questions and make suggestions.  Floyce Stakes 03/09/2013, 11:31 PM

## 2013-03-09 NOTE — ED Notes (Signed)
Telepsych paperwork is faxed

## 2013-03-09 NOTE — ED Notes (Signed)
Pt here for increased depression and feeling hopeless and upset all the time; pt sts having difficulty having energy to care for her children; pt denies SI/HI; pt takes zoloft at present but sts not helping; pt sts currently breast feeding

## 2013-03-09 NOTE — ED Provider Notes (Signed)
History     CSN: [id number]  Arrival date & time 03/09/13  1106   First MD Initiated Contact with Patient 03/09/13 1153      Chief Complaint  Patient presents with  . Medical Clearance    (Consider location/radiation/quality/duration/timing/severity/associated sxs/prior treatment) HPI Comments: Patient presents with complaint of severe depression, tearfulness, hopelessness, decreased energy for the past 2-3 weeks. She denies suicidal or homicidal ideation. She has trouble getting out of bed. She has small kids at home she cares for and she states that this has been difficult. Last child born 13 months ago. She denies current medical complaints other than occasional paresthesias in her bilateral hands upon waking in the morning. She denies drug or weakness. Onset of symptoms gradual. Course is gradually worsening. Nothing makes symptoms better or worse. She has been prescribed Paxil by her primary care physician in the end of April but it is unclear if she's been taking these regularly. She states that the medicine made her feel worse.  The history is provided by the patient. The history is limited by a language barrier. A language interpreter was used.    Past Medical History  Diagnosis Date  . Hypertension     Past Surgical History  Procedure Laterality Date  . No past surgeries      Family History  Problem Relation Age of Onset  . Hypertension Maternal Grandmother   . Hypertension Maternal Grandfather   . Anesthesia problems Neg Hx   . Hypotension Neg Hx   . Malignant hyperthermia Neg Hx   . Pseudochol deficiency Neg Hx     History  Substance Use Topics  . Smoking status: Never Smoker   . Smokeless tobacco: Never Used  . Alcohol Use: No    OB History   Grav Para Term Preterm Abortions TAB SAB Ect Mult Living   2 2 2       2       Review of Systems  Constitutional: Negative for fever.  HENT: Negative for sore throat and rhinorrhea.   Eyes: Negative for redness.   Respiratory: Negative for cough.   Cardiovascular: Negative for chest pain.  Gastrointestinal: Positive for nausea. Negative for vomiting, abdominal pain and diarrhea.  Genitourinary: Negative for dysuria.  Musculoskeletal: Negative for myalgias.  Skin: Negative for rash.  Neurological: Negative for headaches.  Psychiatric/Behavioral: Negative for suicidal ideas and self-injury. The patient is nervous/anxious.     Allergies  Review of patient's allergies indicates no known allergies.  Home Medications   Current Outpatient Rx  Name  Route  Sig  Dispense  Refill  . loratadine (CLARITIN) 10 MG tablet   Oral   Take 10 mg by mouth daily as needed for allergies.          . methyldopa (ALDOMET) 250 MG tablet   Oral   Take 250 mg by mouth 2 (two) times daily.         . Multiple Vitamin (MULTIVITAMIN) capsule   Oral   Take 1 capsule by mouth daily.         Marland Kitchen PARoxetine (PAXIL) 10 MG tablet   Oral   Take 10 mg by mouth every morning.           BP 139/95  Pulse 100  Temp(Src) 98.2 F (36.8 C) (Oral)  Resp 20  SpO2 98%  Physical Exam  Nursing note and vitals reviewed. Constitutional: She appears well-developed and well-nourished.  HENT:  Head: Normocephalic and atraumatic.  Eyes: Conjunctivae are normal.  Right eye exhibits no discharge. Left eye exhibits no discharge.  Neck: Normal range of motion. Neck supple.  Cardiovascular: Normal rate, regular rhythm and normal heart sounds.   Pulmonary/Chest: Effort normal and breath sounds normal.  Abdominal: Soft. There is no tenderness.  Neurological: She is alert.  Skin: Skin is warm and dry.  Psychiatric: She has a normal mood and affect.  Depressed/tearful    ED Course  Procedures (including critical care time)  Labs Reviewed  COMPREHENSIVE METABOLIC PANEL - Abnormal; Notable for the following:    ALT 45 (*)    All other components within normal limits  SALICYLATE LEVEL - Abnormal; Notable for the following:     Salicylate Lvl <2.0 (*)    All other components within normal limits  ACETAMINOPHEN LEVEL  ETHANOL  CBC  URINE RAPID DRUG SCREEN (HOSP PERFORMED)  POCT PREGNANCY, URINE   No results found.   1. Depression     12:22 PM Patient seen and examined. Medications ordered. Holding orders completed. Counseled patient on proper use of SSRIs.   Vital signs reviewed and are as follows: Filed Vitals:   03/09/13 1110  BP: 139/95  Pulse: 100  Temp: 98.2 F (36.8 C)  Resp: 20   3:43 PM Telepsych consult complete and recommendation made for admission. She is medically cleared.     MDM  Depression, telepsych recommends admission.         Renne Crigler, PA-C 03/09/13 1544

## 2013-03-09 NOTE — ED Notes (Signed)
Visitors here to see Pt. Wanded by security and belongings placed in a locker until they are ready to leave the facility.

## 2013-03-09 NOTE — BH Assessment (Signed)
[facility] Assessment Progress Note   Patient has been accepted to [facility] by Donell Sievert, PA to Dr. Daleen Bo.  Room assignment upon arrival.  Patient needed spanish interpreter so [employer] interpreter was utilized.  Patient did sign voluntary admission paperwork including the spanish language form.  Patient disposition was discussed with Dr. Rulon Abide (EDP) and nursing staff.

## 2013-03-09 NOTE — BH Assessment (Signed)
Assessment Note   Michelle Christian is an 27 y.o. female that presents to [facility] with her family reporting increasing depression over the last 17 days, and feelings of hopelessness and helplessness.  An interpreter was used for this assessment, as pt understands little [place] and only speaks [place].  Pt reported HI toward her 83 month old son to the ED nurse and to the telepsychiatrist.  A telepsych was ordered and inpatient treatment recommended and medication recommendations were made.  Pt reports sadness, crying spells, lack of sleep because she is attending to the baby, and feeling "desperate."  Pt reports no other stressors.  Pt stated she hates feeling this way.  Pt stated she has felt depressed before, but not to this extent, and also the HI scares her.  Pt tearful and cooperative during assessment.  Pt endorses anxiety with recent panic attacks as well as being afraid to drive or leave her home.  Pt denies psychosis.  Pt denies SA.  Pt has no previous MH/SA history.  Pt was prescribed Sertraline and Paroxetine by her PCP at the end of April, but pt reported even though she takes them as prescribed, they have not helped.  Pt reported she knows she needs help and is motivated for treatment.  Pt lives at home with her spouse and children (she also has an 92 yr old) and doesn't work.  Pt reports her family is supportive and no other current stressors other then the baby.  Completed assessment, assessment notification, and faxed to [facility] to run for possible admission.  Updated ED staff.  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 Hypertension   . Depression 01/2013   Axis IV: other psychosocial or environmental problems, problems with access to health care services and problems with primary support group Axis V: 11-20 some danger of hurting self or others possible OR occasionally fails to maintain  minimal personal hygiene OR gross impairment in communication  Past Medical History:  Past Medical History  Diagnosis Date  . Hypertension   . Depression 01/2013    Past Surgical History  Procedure Laterality Date  . No past surgeries      Family History:  Family History  Problem Relation Age of Onset  . Hypertension Maternal Grandmother   . Hypertension Maternal Grandfather   . Anesthesia problems Neg Hx   . Hypotension Neg Hx   . Malignant hyperthermia Neg Hx   . Pseudochol deficiency Neg Hx     Social History:  reports that she has never smoked. She has never used smokeless tobacco. She reports that she does not drink alcohol or use illicit drugs.  Additional Social History:  Alcohol / Drug Use Pain Medications: none Prescriptions: see MAR Over the Counter: see MAR History of alcohol / drug use?: No history of alcohol / drug abuse Longest period of sobriety (when/how long): na Negative Consequences of Use:  (na) Withdrawal Symptoms:  (na)  CIWA: CIWA-Ar BP: 139/95 mmHg Pulse Rate: 100 COWS:    Allergies: No Known Allergies  Home Medications:  (Not in a hospital admission)  OB/GYN Status:  No LMP recorded. Patient has had an injection.  General Assessment Data Location of Assessment: [facility] ED Living Arrangements: Spouse/significant other;Children Can pt return to current living arrangement?: Yes Admission Status: Voluntary Is patient capable of signing voluntary admission?: Yes Transfer from: Home Referral Source: Self/Family/Friend  Education Status Is patient currently  in school?: No Highest grade of school patient has completed: 9  Risk to self Suicidal Ideation: No Suicidal Intent: No Is patient at risk for suicide?: Yes Suicidal Plan?: No Access to Means: No What has been your use of drugs/alcohol within the last 12 months?: pt denies Previous Attempts/Gestures: No How many times?: 0 Other Self Harm Risks: pt denies Triggers for Past  Attempts: None known Intentional Self Injurious Behavior: None Family Suicide History: No Recent stressful life event(s): Conflict (Comment);Turmoil (Comment) (HI, depression, anxiety) Persecutory voices/beliefs?: No Depression: Yes Depression Symptoms: Despondent;Insomnia;Tearfulness;Fatigue;Guilt;Loss of interest in usual pleasures;Feeling worthless/self pity;Feeling angry/irritable Substance abuse history and/or treatment for substance abuse?: No Suicide prevention information given to non-admitted patients: Yes  Risk to Others Homicidal Ideation: Yes-Currently Present Thoughts of Harm to Others: Yes-Currently Present Comment - Thoughts of Harm to Others: HI toward 30 month old child Current Homicidal Intent: No-Not Currently/Within Last 6 Months Current Homicidal Plan: No-Not Currently/Within Last 6 Months Access to Homicidal Means: No Identified Victim: 20 month old child History of harm to others?: No Assessment of Violence: None Noted Violent Behavior Description: na - pt calm, cooperative Does patient have access to weapons?: No Criminal Charges Pending?: No Does patient have a court date: No  Psychosis Hallucinations: None noted Delusions: None noted  Mental Status Report Appear/Hygiene: Other (Comment) (casual in scrubs) Eye Contact: Good Motor Activity: Unremarkable Level of Consciousness: Alert;Crying Mood: Depressed;Anxious Affect: Appropriate to circumstance Anxiety Level: Panic Attacks Panic attack frequency: pt states has had several lately, afraid to leave home or drive Most recent panic attack: pt cannot recall Thought Processes: Coherent;Relevant Judgement: Unimpaired Orientation: Person;Place;Time;Situation;Appropriate for developmental age Obsessive Compulsive Thoughts/Behaviors: None  Cognitive Functioning Concentration: Normal Memory: Recent Intact;Remote Intact IQ: Average Insight: Poor Impulse Control: Fair Appetite: Poor Weight Loss: 13 (in  past month) Weight Gain: 0 Sleep: Decreased Total Hours of Sleep:  (varies - wakes through the night) Vegetative Symptoms: None  ADLScreening [facility] Assessment Services) Patient's cognitive ability adequate to safely complete daily activities?: Yes Patient able to express need for assistance with ADLs?: Yes Independently performs ADLs?: Yes (appropriate for developmental age)  Abuse/Neglect [facility]) Physical Abuse: Denies Verbal Abuse: Denies Sexual Abuse: Denies  Prior Inpatient Therapy Prior Inpatient Therapy: No Prior Therapy Dates: na Prior Therapy Facilty/Provider(s): na Reason for Treatment: na  Prior Outpatient Therapy Prior Outpatient Therapy: No Prior Therapy Dates: na Prior Therapy Facilty/Provider(s): na Reason for Treatment: na  ADL Screening (condition at time of admission) Patient's cognitive ability adequate to safely complete daily activities?: Yes Patient able to express need for assistance with ADLs?: Yes Independently performs ADLs?: Yes (appropriate for developmental age)  Home Assistive Devices/Equipment Home Assistive Devices/Equipment: None    Abuse/Neglect Assessment (Assessment to be complete while patient is alone) Physical Abuse: Denies Verbal Abuse: Denies Sexual Abuse: Denies Exploitation of patient/patient's resources: Denies Self-Neglect: Denies Values / Beliefs Cultural Requests During Hospitalization: None Spiritual Requests During Hospitalization: None Consults Spiritual Care Consult Needed: No Social Work Consult Needed: No Merchant navy officer (For Healthcare) Advance Directive: Patient does not have advance directive;Patient would not like information    Additional Information 1:1 In Past 12 Months?: No CIRT Risk: No Elopement Risk: No Does patient have medical clearance?: Yes     Disposition:  Disposition Initial Assessment Completed for this Encounter: Yes Disposition of Patient: Referred to;Inpatient treatment  program Type of inpatient treatment program: Adult Patient referred to: Other (Comment) (Pending [facility])  On Site Evaluation by:   Reviewed with Physician: PA Emmit Alexanders  Caryl Comes 03/09/2013 6:35 PM

## 2013-03-10 ENCOUNTER — Encounter (HOSPITAL_COMMUNITY): Payer: Self-pay | Admitting: Psychiatry

## 2013-03-10 DIAGNOSIS — F411 Generalized anxiety disorder: Secondary | ICD-10-CM | POA: Diagnosis present

## 2013-03-10 DIAGNOSIS — F332 Major depressive disorder, recurrent severe without psychotic features: Secondary | ICD-10-CM

## 2013-03-10 DIAGNOSIS — F339 Major depressive disorder, recurrent, unspecified: Secondary | ICD-10-CM | POA: Diagnosis present

## 2013-03-10 MED ORDER — ENSURE COMPLETE PO LIQD: 237.0000 mL | Freq: Two times a day (BID) | ORAL | Status: DC | PRN

## 2013-03-10 MED ORDER — PRENATAL MULTIVITAMIN CH
1.0000 | ORAL_TABLET | Freq: Every day | ORAL | Status: DC
  Administered 2013-03-11 – 2013-03-12 (×2): 1 via ORAL
  Filled 2013-03-10 (×3): qty 1

## 2013-03-10 MED ORDER — ADULT MULTIVITAMIN W/MINERALS CH
1.0000 | ORAL_TABLET | Freq: Every day | ORAL | Status: DC
Start: 2013-03-10 — End: 2013-03-10
  Administered 2013-03-10: 1 via ORAL
  Filled 2013-03-10 (×4): qty 1

## 2013-03-10 NOTE — Progress Notes (Signed)
Adult Psychoeducational Group Note  Date:  03/10/2013 Time:  11:00 PM  Group Topic/Focus:  Goals Group:   The focus of this group is to help patients establish daily goals to achieve during treatment and discuss how the patient can incorporate goal setting into their daily lives to aide in recovery.  Participation Level:  Active  Participation Quality:  Appropriate  Affect:  Appropriate  Cognitive:  Appropriate  Insight: Appropriate  Engagement in Group:  Engaged  Modes of Intervention:  Discussion  Additional Comments:  Was able to tell group about what triggers her depressions and how she plans to deal with her depression in a positive manner.  Terie Purser R 03/10/2013, 11:00 PM

## 2013-03-10 NOTE — [facility] Suicide Risk Assessment (Signed)
Suicide Risk Assessment  Admission Assessment     Nursing information obtained from:  Patient Demographic factors:  Low socioeconomic status;Unemployed Current Mental Status:  NA Loss Factors:  Decrease in vocational status Historical Factors:  NA Risk Reduction Factors:  Responsible for children under 29 years of age;Living with another person, especially a relative;Positive social support  CLINICAL FACTORS:   Severe Anxiety and/or Agitation Depression:   Insomnia  COGNITIVE FEATURES THAT CONTRIBUTE TO RISK: None identified   SUICIDE RISK:   Moderate:  Frequent suicidal ideation with limited intensity, and duration, some specificity in terms of plans, no associated intent, good self-control, limited dysphoria/symptomatology, some risk factors present, and identifiable protective factors, including available and accessible social support.  PLAN OF CARE: Supportive approach/coping skills                              Reassess her medications                              CBT to help her anxiety  I certify that inpatient services furnished can reasonably be expected to improve the patient's condition.  Antwion Carpenter A 03/10/2013, 3:35 PM

## 2013-03-10 NOTE — Progress Notes (Signed)
Recreation Therapy Notes  Date: 05.14.2014 Time: 3:00pm Location: [address] Dayroom      Group Topic/Focus: Decision Making  Participation Level: Did not attend  Zyron Deeley L Malini Flemings, LRT/CTRS  Amario Longmore L 03/10/2013 4:30 PM 

## 2013-03-10 NOTE — Progress Notes (Signed)
Nutrition Brief Note  Patient identified on the Malnutrition Screening Tool (MST) Report  Body mass index is 32.18 kg/(m^2). Patient meets criteria for obesity grade 1 based on current BMI.   Wt Readings from Last 10 Encounters:  03/09/13 176 lb (79.833 kg)  12/31/12 189 lb 8 oz (85.957 kg)  10/15/12 193 lb 4.8 oz (87.68 kg)  07/27/12 188 lb (85.276 kg)  05/11/12 183 lb 12.8 oz (83.371 kg)  02/10/12 174 lb 4.8 oz (79.062 kg)  01/12/12 194 lb (87.998 kg)  01/09/12 193 lb 9.6 oz (87.816 kg)  01/06/12 192 lb 3.2 oz (87.181 kg)  12/30/11 194 lb 6.4 oz (88.179 kg)     Current diet order is regular, patient is consuming approximately unknown% of meals at this time. Labs and medications reviewed.   Patient is a spanish speaking female admitted with depression.  Has a 64 month old that she is breastfeeding.  New admit.  Receiving MVI daily.  Will order Ensure Complete prn if patient has decreased po intake.  13 lb weight loss in the past 2 months.  No nutrition interventions warranted at this time. If nutrition issues arise, please consult RD.   Oran Rein, RD, LDN Clinical Inpatient Dietitian Pager:  [phone] Weekend and after hours pager:  [phone]

## 2013-03-10 NOTE — B[facility] Counselor (Signed)
Adult Comprehensive Assessment  Patient ID: Michelle Christian, female   DOB: [birth year], 27 y.o.   MRN: [id number]  Information Source: Information source: Patient;Interpreter  Current Stressors:  Educational / Learning stressors: None Employment / Job issues: Patient is unemployed Family Relationships: None Surveyor, quantity / Lack of resources (include bankruptcy): None Housing / Lack of housing: None Physical health (include injuries & life threatening diseases): HTN Social relationships: None Substance abuse: None Bereavement / Loss: None  Living/Environment/Situation:  Living Arrangements: Spouse/significant other;Children Living conditions (as described by patient or guardian): good How long has patient lived in current situation?: three year What is atmosphere in current home: Comfortable;Supportive;Loving  Family History:  Marital status: Single Does patient have children?: Yes How many children?: 2 How is patient's relationship with their children?: Good  Childhood History:  By whom was/is the patient raised?: Mother;Grandparents Additional childhood history information: Patient reports havign a good childhood Description of patient's relationship with caregiver when they were a child: Good Patient's description of current relationship with people who raised him/her: Good Does patient have siblings?: Yes Number of Siblings: 4 Description of patient's current relationship with siblings: Good Did patient suffer any verbal/emotional/physical/sexual abuse as a child?: No Did patient suffer from severe childhood neglect?: No Has patient ever been sexually abused/assaulted/raped as an adolescent or adult?: No Was the patient ever a victim of a crime or a disaster?: No Witnessed domestic violence?: No Has patient been effected by domestic violence as an adult?: No  Education:  Highest grade of school patient has completed: 9th Currently a student?: No Learning disability?:  No  Employment/Work Situation:   Employment situation: Unemployed Patient's job has been impacted by current illness: No What is the longest time patient has a held a job?: nine months 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 Has patient ever been in the [employer]?: No Has patient ever served in Buyer, retail?: No  Financial Resources:   Surveyor, quantity resources: Support from parents / caregiver Does patient have a Lawyer or guardian?: No  Alcohol/Substance Abuse:   Alcohol/Substance Abuse Treatment Hx: Denies past history Has alcohol/substance abuse ever caused legal problems?: No  Social Support System:   Forensic psychologist System: None Type of faith/religion: Ephriam Knuckles How does patient's faith help to cope with current illness?: Prayer  Leisure/Recreation:   Leisure and Hobbies: Dancing  Strengths/Needs:   What things does the patient do well?: Good mother and good cook In what areas does patient struggle / problems for patient: Health  Discharge Plan:   Does patient have access to transportation?: Yes Will patient be returning to same living situation after discharge?: Yes Currently receiving community mental health services: No If no, would patient like referral for services when discharged?: Yes (What county?) Medical sales representative) Does patient have financial barriers related to discharge medications?: Yes Patient description of barriers related to discharge medications: No insurance  Summary/Recommendations:  Ioma Chismar is a 28 year old Hispanic female admitted with Major Depression Disorder.  She will benefit from crisis stabilization, evaluation for medication, psycho-education groups for coping skills development, group therapy and case management for discharge planning.     Keshonda Monsour, Joesph July. 03/10/2013

## 2013-03-10 NOTE — [facility] Group Notes (Signed)
[facility] LCSW Aftercare Discharge Planning Group Note   03/10/2013 10:46 AM  Participation Quality:  Patient attended group but unable to participate due to not having an interpreter.  Leena Tiede, Joesph July

## 2013-03-10 NOTE — Progress Notes (Signed)
Adult Psychoeducational Group Note  Date:  03/10/2013 Time:  11:00AM  Group Topic/Focus: Mental Health Crisis Coping With Mental Health Crisis:   The purpose of this group is to help patients identify strategies for coping with mental health crisis.  Group discusses possible causes of crisis and ways to manage them effectively.  Participation Level:  Did Not Attend  Additional Comments: Patient attended group at the very end. Language barrier so she was waiting on a translator. She did introduce herself and stated once positive thing about herself.    Dwain Sarna P 03/10/2013, 11:50 AM

## 2013-03-10 NOTE — [facility] Group Notes (Signed)
[facility] LCSW Group Therapy  03/10/2013 10:47 AM  Type of Therapy:  Group Therapy  Participation Level:  Active  Participation Quality:  Appropriate  Affect:  Appropriate  Cognitive:  Appropriate  Insight:  Developing/Improving and Engaged  Engagement in Therapy:  Developing/Improving and Engaged  Modes of Intervention:  Discussion, Education, Exploration, Problem-Solving, Rapport Building, Support   Summary of Progress/Problems:  Patient shared through interpreter that she does not like to be accused falsely but knows how let go of other people's negativity.  Wynn Banker 03/10/2013, 10:47 AM

## 2013-03-10 NOTE — Progress Notes (Signed)
Writer admitted patient using translator due to her limited english. Patient reports crying a lot and feeling weak. Patient admits to feeling depressed.  Patient has a 47 month old girl which she reports that she breast feeds. Patient reports that the babies father is supportive. Patient has no medical history. Patient is tearful during admission process. Patient oriented to unit, meal offered but refused, fluids given. Patient denies si/hi/a/v hallucinations. Safety maintained with 15 min checks.

## 2013-03-10 NOTE — H&P (Signed)
Psychiatric Admission Assessment Adult  Patient Identification:  Michelle Christian Date of Evaluation:  03/10/2013 Chief Complaint:  MDD History of Present Illness: Michelle Christian is an 27 y.o. female that presents to [facility] with her family reporting increasing depression over the last 17 days, and feelings of hopelessness and helplessness. An interpreter was used for this assessment, as pt understands little [place] and only speaks [place]. Pt reported HI toward her 24 month old son to the ED nurse and to the telepsychiatrist. A telepsych was ordered and inpatient treatment recommended and medication recommendations were made. Pt reports sadness, crying spells, lack of sleep because she is attending to the baby, and feeling "desperate." Pt reports no other stressors. Pt stated she hates feeling this way. Pt stated she has felt depressed before, but not to this extent, and also the HI scares her. Pt tearful and cooperative during assessment. Pt endorses anxiety with recent panic attacks as well as being afraid to drive or leave her home. Pt denies psychosis. Pt denies SA. Pt has no previous MH/SA history. Pt was prescribed Sertraline and Paroxetine by her PCP at the end of April, but pt reported even though she takes them as prescribed, they have not helped. Pt reported she knows she needs help and is motivated for treatment. Pt lives at home with her spouse and children (she also has an 76 yr old) and doesn't work. Pt reports her family is supportive and no other current stressors other then the baby.  Today the assessment is completed with the help of a language interpreter. Patient reports feeling "nervous, tense and having no motivation to do anything for the last week." The patient reports feeling fine prior to this time. She is not able to name any stressors and denies that anything upsetting has happened recently. Michelle Christian reports that things between her husband and her are "perfect, We never even fight." Michelle Christian also  reports having any intent to hurt herself or her child. She reports that her PCP started her on paxil one week ago and has felt "some better." Her main complaint is disrupted sleep from her infant son over the last two months. Patient states "He was been waking up every two hours so it's hard to rest well." The [place] and came to the [place] in 2003. Patient states "I want to feel well so I can care for my family."    Elements:  Location:  [facility] in-patient. Quality:  Worsening depression and insomnia. Severity:  Decrease level of functioning. Timing:  Last few weeks. Duration:  One year. Context:  anxiety, depression and difficulty sleeping due to young child. Associated Signs/Synptoms: Depression Symptoms:  depressed mood, insomnia, fatigue, feelings of worthlessness/guilt, anxiety, panic attacks, (Hypo) Manic Symptoms:  Denies Anxiety Symptoms:  Excessive Worry, Panic Symptoms, Psychotic Symptoms:  Denies PTSD Symptoms: Denies  Psychiatric Specialty Exam: Physical Exam-Exam reviewed from ED.  Review of Systems  Constitutional: Negative.   HENT: Negative.   Eyes: Negative.   Respiratory: Negative.   Cardiovascular: Negative.   Gastrointestinal: Negative.   Genitourinary: Negative.   Musculoskeletal: Negative.   Skin: Negative.   Neurological: Negative.   Endo/Heme/Allergies: Negative.   Psychiatric/Behavioral: Positive for depression. Negative for suicidal ideas, hallucinations, memory loss and substance abuse. The patient is nervous/anxious and has insomnia.     Blood pressure 121/82, pulse 99, temperature 98.1 F (36.7 C), temperature source Oral, resp. rate 18, height 5\' 2"  (1.575 m), weight 79.833 kg (176 lb).Body mass index  is 32.18 kg/(m^2).  General Appearance: Disheveled  Eye Solicitor::  Fair  Speech:  Clear and Coherent  Volume:  Normal  Mood:  Depressed  Affect:  Flat  Thought Process:  Goal Directed  Orientation:  Full  (Time, Place, and Person)  Thought Content:  WDL  Suicidal Thoughts:  No  Homicidal Thoughts:  No  Memory:  Immediate;   Fair Recent;   Fair Remote;   Fair  Judgement:  Fair  Insight:  Fair  Psychomotor Activity:  Normal  Concentration:  Fair  Recall:  Good  Akathisia:  No  Handed:  Right  AIMS (if indicated):     Assets:  Communication Skills Desire for Improvement Housing Intimacy Leisure Time Physical Health Social Support  Sleep:  Number of Hours: 4.75    Past Psychiatric History:None Diagnosis:None  Hospitalizations:None  Outpatient Care:None  Substance Abuse Care:None  Self-Mutilation:None  Suicidal Attempts:None  Violent Behaviors:None   Past Medical History:   Past Medical History  Diagnosis Date  . Hypertension   . Depression 01/2013   None. Allergies:  No Known Allergies PTA Medications: Prescriptions prior to admission  Medication Sig Dispense Refill  . loratadine (CLARITIN) 10 MG tablet Take 10 mg by mouth daily as needed for allergies.       . methyldopa (ALDOMET) 250 MG tablet Take 250 mg by mouth 2 (two) times daily.      . Multiple Vitamin (MULTIVITAMIN) capsule Take 1 capsule by mouth daily.      Marland Kitchen PARoxetine (PAXIL) 10 MG tablet Take 10 mg by mouth every morning.        Previous Psychotropic Medications:  Medication/Dose  Zoloft 50 mg-made her feel worse               Substance Abuse History in the last 12 months:  no  Consequences of Substance Abuse: NA  Social History:  reports that she has never smoked. She has never used smokeless tobacco. She reports that she does not drink alcohol or use illicit drugs. Additional Social History:                      Current Place of Residence:   Place of Birth:   Family Members: Marital Status:  Married Children:  Sons:  Daughters: Relationships: Education:  [employer] Problems/Performance: Religious Beliefs/Practices: History of Abuse  (Emotional/Phsycial/Sexual) Teacher, music History:  None. Legal History: Hobbies/Interests:  Family History:   Family History  Problem Relation Age of Onset  . Hypertension Maternal Grandmother   . Hypertension Maternal Grandfather   . Anesthesia problems Neg Hx   . Hypotension Neg Hx   . Malignant hyperthermia Neg Hx   . Pseudochol deficiency Neg Hx     Results for orders placed during the hospital encounter of 03/09/13 (from the past 72 hour(s))  URINE RAPID DRUG SCREEN (HOSP PERFORMED)     Status: None   Collection Time    03/09/13  1:15 PM      Result Value Range   Opiates NONE DETECTED  NONE DETECTED   Cocaine NONE DETECTED  NONE DETECTED   Benzodiazepines NONE DETECTED  NONE DETECTED   Amphetamines NONE DETECTED  NONE DETECTED   Tetrahydrocannabinol NONE DETECTED  NONE DETECTED   Barbiturates NONE DETECTED  NONE DETECTED   Comment:            DRUG SCREEN FOR MEDICAL PURPOSES     ONLY.  IF CONFIRMATION IS NEEDED     FOR  ANY PURPOSE, NOTIFY LAB     WITHIN 5 DAYS.                LOWEST DETECTABLE LIMITS     FOR URINE DRUG SCREEN     Drug Class       Cutoff (ng/mL)     Amphetamine      1000     Barbiturate      200     Benzodiazepine   200     Tricyclics       300     Opiates          300     Cocaine          300     THC              50  ACETAMINOPHEN LEVEL     Status: None   Collection Time    03/09/13  1:26 PM      Result Value Range   Acetaminophen (Tylenol), Serum <15.0  10 - 30 ug/mL   Comment:            THERAPEUTIC CONCENTRATIONS VARY     SIGNIFICANTLY. A RANGE OF 10-30     ug/mL MAY BE AN EFFECTIVE     CONCENTRATION FOR MANY PATIENTS.     HOWEVER, SOME ARE BEST TREATED     AT CONCENTRATIONS OUTSIDE THIS     RANGE.     ACETAMINOPHEN CONCENTRATIONS     >150 ug/mL AT 4 HOURS AFTER     INGESTION AND >50 ug/mL AT 12     HOURS AFTER INGESTION ARE     OFTEN ASSOCIATED WITH TOXIC     REACTIONS.  ETHANOL     Status: None    Collection Time    03/09/13  1:26 PM      Result Value Range   Alcohol, Ethyl (B) <11  0 - 11 mg/dL   Comment:            LOWEST DETECTABLE LIMIT FOR     SERUM ALCOHOL IS 11 mg/dL     FOR MEDICAL PURPOSES ONLY  CBC     Status: None   Collection Time    03/09/13  1:26 PM      Result Value Range   WBC 8.7  4.0 - 10.5 K/uL   RBC 4.52  3.87 - 5.11 MIL/uL   Hemoglobin 13.1  12.0 - 15.0 g/dL   HCT 16.1  09.6 - 04.5 %   MCV 88.3  78.0 - 100.0 fL   MCH 29.0  26.0 - 34.0 pg   MCHC 32.8  30.0 - 36.0 g/dL   RDW 40.9  81.1 - 91.4 %   Platelets 188  150 - 400 K/uL  COMPREHENSIVE METABOLIC PANEL     Status: Abnormal   Collection Time    03/09/13  1:26 PM      Result Value Range   Sodium 139  135 - 145 mEq/L   Potassium 3.9  3.5 - 5.1 mEq/L   Chloride 104  96 - 112 mEq/L   CO2 24  19 - 32 mEq/L   Glucose, Bld 97  70 - 99 mg/dL   BUN 8  6 - 23 mg/dL   Creatinine, Ser 7.82  0.50 - 1.10 mg/dL   Calcium 9.3  8.4 - 95.6 mg/dL   Total Protein 7.4  6.0 - 8.3 g/dL   Albumin 4.1  3.5 - 5.2 g/dL   AST  24  0 - 37 U/L   ALT 45 (*) 0 - 35 U/L   Alkaline Phosphatase 81  39 - 117 U/L   Total Bilirubin 0.3  0.3 - 1.2 mg/dL   GFR calc non Af Amer >90  >90 mL/min   GFR calc Af Amer >90  >90 mL/min   Comment:            The eGFR has been calculated     using the CKD EPI equation.     This calculation has not been     validated in all clinical     situations.     eGFR's persistently     <90 mL/min signify     possible Chronic Kidney Disease.  SALICYLATE LEVEL     Status: Abnormal   Collection Time    03/09/13  1:26 PM      Result Value Range   Salicylate Lvl <2.0 (*) 2.8 - 20.0 mg/dL  POCT PREGNANCY, URINE     Status: None   Collection Time    03/09/13  1:29 PM      Result Value Range   Preg Test, Ur NEGATIVE  NEGATIVE   Comment:            THE SENSITIVITY OF THIS     METHODOLOGY IS >24 mIU/mL   Psychological Evaluations:  Assessment:   AXIS I:  Major Depression, Recurrent  severe AXIS II:  Deferred AXIS III:   Past Medical History  Diagnosis Date  . Hypertension   . Depression 01/2013   AXIS IV:  other psychosocial or environmental problems and problems with primary support group AXIS V:  41-50 serious symptoms   Treatment Plan/Recommendations:   1. Admit for crisis management and stabilization. Estimated length of stay 5-7 days. 2. Medication management to reduce current symptoms to base line and improve the patient's level of functioning. Continued on Paxil 10 mg po daily for depressive and anxious symptoms. Benadryl initiated to help improve sleep. 3. Develop treatment plan to decrease risk of relapse upon discharge of depressive symptoms and the need for readmission. 5. Group therapy to facilitate development of healthy coping skills to use for depression and anxiety. 6. Health care follow up as needed for medical problems.  7. Discharge plan to include therapy to help patient cope with stressors.  8. Call for Consult with Hospitalist for additional specialty patient services as needed.   Treatment Plan Summary: Daily contact with patient to assess and evaluate symptoms and progress in treatment Medication management Current Medications:  Current Facility-Administered Medications  Medication Dose Route Frequency Provider Last Rate Last Dose  . acetaminophen (TYLENOL) tablet 650 mg  650 mg Oral Q6H PRN Kerry Hough, PA-C      . alum & mag hydroxide-simeth (MAALOX/MYLANTA) 200-200-20 MG/5ML suspension 30 mL  30 mL Oral Q4H PRN Kerry Hough, PA-C      . diphenhydrAMINE (BENADRYL) capsule 50 mg  50 mg Oral QHS,MR X 1 Spencer E Simon, PA-C      . feeding supplement (ENSURE COMPLETE) liquid 237 mL  237 mL Oral BID PRN Jeoffrey Massed, RD      . loratadine (CLARITIN) tablet 10 mg  10 mg Oral Daily PRN Kerry Hough, PA-C      . magnesium hydroxide (MILK OF MAGNESIA) suspension 30 mL  30 mL Oral Daily PRN Kerry Hough, PA-C      . methyldopa  (ALDOMET) tablet 250 mg  250 mg Oral BID  Kerry Hough, PA-C      . multivitamin with minerals tablet 1 tablet  1 tablet Oral Daily Anabella Capshaw, MD   1 tablet at 03/10/13 0807  . PARoxetine (PAXIL) tablet 10 mg  10 mg Oral BH-q7a Spencer E Simon, PA-C   10 mg at 03/10/13 1610    Observation Level/Precautions:  15 minute checks  Laboratory:  CBC Chemistry Profile  Psychotherapy: Individual and Group Therapy  Medications:  See list  Consultations:  As needed  Discharge Concerns:  Safety and Stabilization  Estimated LOS: 3-5 days  Other:     I certify that inpatient services furnished can reasonably be expected to improve the patient's condition.   Fransisca Kaufmann NP-C 5/14/201411:35 AM

## 2013-03-10 NOTE — Progress Notes (Signed)
D: Patient denies SI/HI and auditory and visual hallucinations. The patient speaks Spanish and requires an interpretor and the interpretor service has been notified. The patient has a depressed mood and affect. The patient writes that she slept poorly and that her appetite is poor and her energy level is low. The patient rates her depression a 4 out of 10 and her hopelessness a 6 out of 10 (1 low/10 high). The patient is isolating her room and in having minimal participation within the milieu.  A: Patient given emotional support from RN. Patient encouraged to come to staff with concerns and/or questions. Patient's medication routine continued. Patient's orders and plan of care reviewed.  R: Patient remains cooperative. Will continue to monitor patient q15 minutes for safety.

## 2013-03-11 MED ORDER — RISPERIDONE 0.5 MG PO TABS
0.5000 mg | ORAL_TABLET | Freq: Two times a day (BID) | ORAL | Status: DC
  Administered 2013-03-11 – 2013-03-12 (×2): 0.5 mg via ORAL
  Filled 2013-03-11 (×4): qty 1

## 2013-03-11 NOTE — Progress Notes (Signed)
Adult Psychoeducational Group Note  Date:  03/11/2013 Time:  9:24 PM  Group Topic/Focus:  Rediscovering Joy:   The focus of this group is to explore various ways to relieve stress in a positive manner.  Participation Level:  Minimal  Participation Quality:  Appropriate  Affect:  Appropriate  Cognitive:  Appropriate  Insight: Appropriate  Engagement in Group:  Supportive  Modes of Intervention:  Support  Additional Comments:   Adelina Mings 03/11/2013, 9:24 PM

## 2013-03-11 NOTE — [facility] Group Notes (Signed)
[facility] LCSW Group Therapy Living a Balance Life 1:15 - 2:30   03/11/2013 11:35 AM  Type of Therapy:  Group Therapy  Participation Level:  Active  Participation Quality:  Appropriate  Affect:  Appropriate  Cognitive:  Appropriate  Insight:  Developing/Improving and Engaged  Engagement in Therapy:  Developing/Improving and Engaged  Modes of Intervention:  Discussion, Education, Exploration, Problem-Solving, Rapport Building, Support  Summary of Progress/Problems:  Patient shared she has become very attached to her children and could balance life by asking family for help with their care.  She shared she has an aunt and uncle who are keeping children while she is in the hospital who would help her but she enjoys being with her children.  Patient shared if she had some free time she could get her hair done or go dancing.  Wynn Banker 03/11/2013, 11:35 AM

## 2013-03-11 NOTE — Progress Notes (Signed)
D: Patient denies SI/HI/AVH. Interpreter was used for communication.  Patient rates hopelessness as 1, depression as 6, and anxiety as 3.  Patient affect and mood are sad and depressed.  Pt tearful.  Pt states "I want to go home.  I miss my children."  Patient did attend evening group. Patient visible on the milieu. No distress noted. A: Support and encouragement offered. Scheduled medications given to pt. Q 15 min checks continued for patient safety. R: Patient receptive. Patient remains safe on the unit.

## 2013-03-11 NOTE — [facility] Group Notes (Signed)
[facility] LCSW Aftercare Discharge Planning Group Note   03/11/2013 11:33 AM  Participation Quality:  Appropriate   Mood/Affect:  Appropriate  Depression Rating:  3  Anxiety Rating:  0  Thoughts of Suicide:  No  Will you contract for safety?   NA  Current AVH:  No  Plan for Discharge/Comments:  Patient reports doing well today and wanting to discharge home.  She reports getting a good night's sleep and wanting to get home to her children.  Transportation Means:   Patient has transportation.   Supports:.Patient has a good support system.   Macenzie Burford, Joesph July

## 2013-03-11 NOTE — Progress Notes (Signed)
Adult Psychoeducational Group Note  Date:  03/11/2013 Time:  12:44 PM  Group Topic/Focus:  Self Care:   The focus of this group is to help patients understand the importance of self-care in order to improve or restore emotional, physical, spiritual, interpersonal, and financial health.  Participation Level:  Active  Participation Quality:  Appropriate  Affect:  Appropriate  Cognitive:  Appropriate  Insight: Appropriate  Engagement in Group:  Engaged  Modes of Intervention:  Discussion and Support  Additional Comments:  Patient, through interpretor, was able to detail stressors in her life and ways in which to deal with stress.  Nestor Ramp [facility] 03/11/2013, 12:44 PM

## 2013-03-11 NOTE — Progress Notes (Signed)
Patient in her bedroom during this assessment. Her mood and affects bright and appropriate. She reported that she had a better day than yesterday; "I see my little girl and I didn't cry". She speaks very little [place]; somewhat enough  to communicate needs to this Clinical research associate. She denied SI/HI and denied Hallucinations. Writer encouraged patient to attend Karaoke. Q 15 minutes checks continues as ordered to maintain safety.

## 2013-03-11 NOTE — Progress Notes (Signed)
D: Patient denies SI/HI and auditory and visual hallucinations. The patient has a depressed mood and affect. The patient speaks minimal English and has an interpretor present. The patient reports sleeping fairly well and states that her appetite and energy level are improving. The patient states that her depression is a "little better" but that she wants it to "be gone."  A: Patient given emotional support from RN. Patient encouraged to come to staff with concerns and/or questions. Patient's medication routine continued. Patient's orders and plan of care reviewed.  R: Patient remains cooperative. Will continue to monitor patient q15 minutes for safety.

## 2013-03-11 NOTE — Progress Notes (Signed)
[facility] MD Progress Note  03/11/2013 10:57 AM Michelle Christian  MRN:  [id number] Subjective: Patient seen and assessed with help of interpreter. Patient lying in bed, tearful throughout. Feeling hopeless that she may not get better, wanting to go home. Reports that the Paxil has not been helpful so far and she was on Zoloft previously which worsened her symptoms. She is endorsing agitation and anxiety. Diagnosis:   Axis I: Major Depression, Recurrent severe Axis II: Deferred Axis III:  Past Medical History  Diagnosis Date  . Hypertension   . Depression 01/2013   Axis IV: other psychosocial or environmental problems Axis V: 41-50 serious symptoms  ADL's:  Impaired  Sleep: Poor  Appetite:  Fair   Psychiatric Specialty Exam: Review of Systems  Constitutional: Negative.   HENT: Negative.   Eyes: Negative.   Respiratory: Negative.   Cardiovascular: Negative.   Gastrointestinal: Negative.   Genitourinary: Negative.   Musculoskeletal: Negative.   Skin: Negative.   Neurological: Negative.   Endo/Heme/Allergies: Negative.   Psychiatric/Behavioral: Positive for depression. The patient is nervous/anxious and has insomnia.     Blood pressure 127/79, pulse 80, temperature 98.3 F (36.8 C), temperature source Oral, resp. rate 18, height 5\' 2"  (1.575 m), weight 79.833 kg (176 lb).Body mass index is 32.18 kg/(m^2).  General Appearance: Casual  Eye Contact::  Minimal  Speech:  Slow  Volume:  Decreased  Mood:  Anxious, Depressed and Hopeless  Affect:  Depressed, Flat, Labile and Tearful  Thought Process:  Circumstantial  Orientation:  Full (Time, Place, and Person)  Thought Content:  Rumination  Suicidal Thoughts:  No  Homicidal Thoughts:  No  Memory:  Immediate;   Fair Recent;   Fair Remote;   Fair  Judgement:  Fair  Insight:  Present  Psychomotor Activity:  Decreased  Concentration:  Fair  Recall:  Fair  Akathisia:  No  Handed:  Right  AIMS (if indicated):     Assets:   Communication Skills Desire for Improvement Housing Physical Health Social Support  Sleep:  Number of Hours: 6.75   Current Medications: Current Facility-Administered Medications  Medication Dose Route Frequency Provider Last Rate Last Dose  . acetaminophen (TYLENOL) tablet 650 mg  650 mg Oral Q6H PRN Kerry Hough, PA-C      . alum & mag hydroxide-simeth (MAALOX/MYLANTA) 200-200-20 MG/5ML suspension 30 mL  30 mL Oral Q4H PRN Kerry Hough, PA-C      . diphenhydrAMINE (BENADRYL) capsule 50 mg  50 mg Oral QHS,MR X 1 Spencer E Simon, PA-C   50 mg at 03/10/13 2100  . feeding supplement (ENSURE COMPLETE) liquid 237 mL  237 mL Oral BID PRN Jeoffrey Massed, RD      . loratadine (CLARITIN) tablet 10 mg  10 mg Oral Daily PRN Kerry Hough, PA-C      . magnesium hydroxide (MILK OF MAGNESIA) suspension 30 mL  30 mL Oral Daily PRN Kerry Hough, PA-C      . methyldopa (ALDOMET) tablet 250 mg  250 mg Oral BID Kerry Hough, PA-C   250 mg at 03/11/13 4098  . prenatal multivitamin tablet 1 tablet  1 tablet Oral Daily Kaliya Shreiner, MD   1 tablet at 03/11/13 0713  . risperiDONE (RISPERDAL) tablet 0.5 mg  0.5 mg Oral BID Jamael Hoffmann, MD        Lab Results:  Results for orders placed during the hospital encounter of 03/09/13 (from the past 48 hour(s))  URINE RAPID DRUG SCREEN (HOSP  PERFORMED)     Status: None   Collection Time    03/09/13  1:15 PM      Result Value Range   Opiates NONE DETECTED  NONE DETECTED   Cocaine NONE DETECTED  NONE DETECTED   Benzodiazepines NONE DETECTED  NONE DETECTED   Amphetamines NONE DETECTED  NONE DETECTED   Tetrahydrocannabinol NONE DETECTED  NONE DETECTED   Barbiturates NONE DETECTED  NONE DETECTED   Comment:            DRUG SCREEN FOR MEDICAL PURPOSES     ONLY.  IF CONFIRMATION IS NEEDED     FOR ANY PURPOSE, NOTIFY LAB     WITHIN 5 DAYS.                LOWEST DETECTABLE LIMITS     FOR URINE DRUG SCREEN     Drug Class       Cutoff (ng/mL)      Amphetamine      1000     Barbiturate      200     Benzodiazepine   200     Tricyclics       300     Opiates          300     Cocaine          300     THC              50  ACETAMINOPHEN LEVEL     Status: None   Collection Time    03/09/13  1:26 PM      Result Value Range   Acetaminophen (Tylenol), Serum <15.0  10 - 30 ug/mL   Comment:            THERAPEUTIC CONCENTRATIONS VARY     SIGNIFICANTLY. A RANGE OF 10-30     ug/mL MAY BE AN EFFECTIVE     CONCENTRATION FOR MANY PATIENTS.     HOWEVER, SOME ARE BEST TREATED     AT CONCENTRATIONS OUTSIDE THIS     RANGE.     ACETAMINOPHEN CONCENTRATIONS     >150 ug/mL AT 4 HOURS AFTER     INGESTION AND >50 ug/mL AT 12     HOURS AFTER INGESTION ARE     OFTEN ASSOCIATED WITH TOXIC     REACTIONS.  ETHANOL     Status: None   Collection Time    03/09/13  1:26 PM      Result Value Range   Alcohol, Ethyl (B) <11  0 - 11 mg/dL   Comment:            LOWEST DETECTABLE LIMIT FOR     SERUM ALCOHOL IS 11 mg/dL     FOR MEDICAL PURPOSES ONLY  CBC     Status: None   Collection Time    03/09/13  1:26 PM      Result Value Range   WBC 8.7  4.0 - 10.5 K/uL   RBC 4.52  3.87 - 5.11 MIL/uL   Hemoglobin 13.1  12.0 - 15.0 g/dL   HCT 40.9  81.1 - 91.4 %   MCV 88.3  78.0 - 100.0 fL   MCH 29.0  26.0 - 34.0 pg   MCHC 32.8  30.0 - 36.0 g/dL   RDW 78.2  95.6 - 21.3 %   Platelets 188  150 - 400 K/uL  COMPREHENSIVE METABOLIC PANEL     Status: Abnormal   Collection Time  03/09/13  1:26 PM      Result Value Range   Sodium 139  135 - 145 mEq/L   Potassium 3.9  3.5 - 5.1 mEq/L   Chloride 104  96 - 112 mEq/L   CO2 24  19 - 32 mEq/L   Glucose, Bld 97  70 - 99 mg/dL   BUN 8  6 - 23 mg/dL   Creatinine, Ser 1.91  0.50 - 1.10 mg/dL   Calcium 9.3  8.4 - 47.8 mg/dL   Total Protein 7.4  6.0 - 8.3 g/dL   Albumin 4.1  3.5 - 5.2 g/dL   AST 24  0 - 37 U/L   ALT 45 (*) 0 - 35 U/L   Alkaline Phosphatase 81  39 - 117 U/L   Total Bilirubin 0.3  0.3 - 1.2 mg/dL   GFR  calc non Af Amer >90  >90 mL/min   GFR calc Af Amer >90  >90 mL/min   Comment:            The eGFR has been calculated     using the CKD EPI equation.     This calculation has not been     validated in all clinical     situations.     eGFR's persistently     <90 mL/min signify     possible Chronic Kidney Disease.  SALICYLATE LEVEL     Status: Abnormal   Collection Time    03/09/13  1:26 PM      Result Value Range   Salicylate Lvl <2.0 (*) 2.8 - 20.0 mg/dL  POCT PREGNANCY, URINE     Status: None   Collection Time    03/09/13  1:29 PM      Result Value Range   Preg Test, Ur NEGATIVE  NEGATIVE   Comment:            THE SENSITIVITY OF THIS     METHODOLOGY IS >24 mIU/mL    Physical Findings: AIMS: Facial and Oral Movements Muscles of Facial Expression: None, normal Lips and Perioral Area: None, normal Jaw: None, normal Tongue: None, normal,Extremity Movements Upper (arms, wrists, hands, fingers): None, normal Lower (legs, knees, ankles, toes): None, normal, Trunk Movements Neck, shoulders, hips: None, normal, Overall Severity Severity of abnormal movements (highest score from questions above): None, normal Incapacitation due to abnormal movements: None, normal Patient's awareness of abnormal movements (rate only patient's report): No Awareness, Dental Status Current problems with teeth and/or dentures?: No Does patient usually wear dentures?: No  CIWA:    COWS:     Treatment Plan Summary: Daily contact with patient to assess and evaluate symptoms and progress in treatment Medication management  Plan: Discontinue Paxil. Start risperdal at 0.5mg  po bid to address mood symptoms and agitation. Side effects including metabolic effects, movement disorders discussed with patient through interpreter. Provide counselling and education.  Medical Decision Making Problem Points:  Established problem, stable/improving (1), Review of last therapy session (1) and Review of  psycho-social stressors (1) Data Points:  Review of medication regiment & side effects (2) Review of new medications or change in dosage (2)  I certify that inpatient services furnished can reasonably be expected to improve the patient's condition.   Rollande Thursby 03/11/2013, 10:57 AM

## 2013-03-12 DIAGNOSIS — F411 Generalized anxiety disorder: Secondary | ICD-10-CM

## 2013-03-12 DIAGNOSIS — O99345 Other mental disorders complicating the puerperium: Secondary | ICD-10-CM

## 2013-03-12 DIAGNOSIS — F329 Major depressive disorder, single episode, unspecified: Secondary | ICD-10-CM

## 2013-03-12 MED ORDER — PRENATAL MULTIVITAMIN CH: 1.0000 | ORAL_TABLET | Freq: Every day | ORAL | Status: DC

## 2013-03-12 MED ORDER — METHYLDOPA 250 MG PO TABS: 250.0000 mg | ORAL_TABLET | Freq: Two times a day (BID) | ORAL | Status: DC

## 2013-03-12 MED ORDER — RISPERIDONE 0.5 MG PO TABS: 0.5000 mg | ORAL_TABLET | Freq: Two times a day (BID) | ORAL | Status: DC

## 2013-03-12 NOTE — [facility] Suicide Risk Assessment (Signed)
[facility] INPATIENT:  Family/Significant Other Suicide Prevention Education  Suicide Prevention Education:  Education Completed, Michelle Christian Significant other and Michelle Christian, Brother, during treatment team with Dr. Dub Mikes interpretinghas been identified by the patient as the family member/significant other with whom the patient will be residing, and identified as the person(s) who will aid the patient in the event of a mental health crisis (suicidal ideations/suicide attempt).  With written consent from the patient, the family member/significant other has been provided the following suicide prevention education, prior to the and/or following the discharge of the patient.  The suicide prevention education provided includes the following:  Suicide risk factors  Suicide prevention and interventions  National Suicide Hotline telephone number  [facility] assessment telephone number  [facility] Emergency Assistance 911  [facility] and/or Residential Mobile Crisis Unit telephone number  Request made of family/significant other to:  Remove weapons (e.g., guns, rifles, knives), all items previously/currently identified as safety concern.  Family denied having guns in the home.  Remove drugs/medications (over-the-counter, prescriptions, illicit drugs), all items previously/currently identified as a safety concern.  The family member/significant other verbalizes understanding of the suicide prevention education information provided.  The family member/significant other agrees to remove the items of safety concern listed above.  Suicide  Prevention Education pamphlet, Spanish Translation, provider to family.  Wynn Banker 03/12/2013, 10:50 AM

## 2013-03-12 NOTE — Clinical Social Work Note (Signed)
Patient's significant other and brother presented for treatment team with MD providing interpretation.  There were no concerns for patient or for safety of patient's children.  Family looking forward to patient discharging home. Significant other stated he has arranged for family to be with patient while he is working to make sure she is doing well.

## 2013-03-12 NOTE — Progress Notes (Signed)
aAdult Psychoeducational Group Note  Date:  03/12/2013 Time:  10:53 AM  Group Topic/Focus:  Therapeutic Activity-Coping Skills Pictionary   Participation Level:  Minimal  Participation Quality:  Appropriate  Affect:  Appropriate  Cognitive:  Alert   Engagement in Group:  Engaged  Modes of Intervention:  Activity  Additional Comments:  Pt. Participated in coping skills pictionary in which team building and interactive learning were practiced. Pt. Had limited participation due to language barrier and no interpreter present.    Ruta Hinds [facility] 03/12/2013, 10:53 AM

## 2013-03-12 NOTE — Progress Notes (Signed)
Pt discharged per MD orders; pt currently denies SI/HI and auditory/visual hallucinations; pt was given education by RN regarding follow-up appointments and medications and pt denied any questions or concerns about these instructions; pt was then escorted to search room to retrieve her belongings by RN before being discharged to hospital lobby. 

## 2013-03-12 NOTE — Tx Team (Signed)
Interdisciplinary Treatment Plan Update   Date Reviewed:  03/12/2013  Time Reviewed:  10:45 AM  Progress in Treatment:   Attending groups: Yes Participating in groups: Yes Taking medication as prescribed: Yes  Tolerating medication: Yes Family/Significant other contact made: Yes, brother and significant other present during treatment team meeting  Patient understands diagnosis: Yes  Discussing patient identified problems/goals with staff: Yes Medical problems stabilized or resolved: Yes Denies suicidal/homicidal ideation: Yes Patient has not harmed self or others: Yes  For review of initial/current patient goals, please see plan of care.  Estimated Length of Stay:  Discharge home today  Reasons for Continued Hospitalization:   New Problems/Goals identified:    Discharge Plan or Barriers:   Home with outpatient follow up Dr. Quintella Reichert  Additional Comments:  Patient reports doing much better.  She denies any SI/HI and stated she was not having any homicidal thoughts to harm her children. She shared she loves her children and enjoys spending time with them.  Patient reports sleeping well which had been a problem prior to admission.  She rates depression at one and anxiety at zero.  Attendees:  Patient: Michelle Christian 03/12/2013 10:45 AM   Signature:Irving Dub Mikes, MD 03/12/2013 10:45 AM  Signature:Troy Para March, Rn  03/12/2013 10:45 AM  Signature: Kennis Carina, Significant other 03/12/2013 10:45 AM  Signature:Noel Ofarrell, Brother  03/12/2013 10:45 AM  Signature: 03/12/2013 10:45 AM  Signature:  [name], LCSW 03/12/2013 10:45 AM  Signature:  03/12/2013 10:45 AM  Signature:  03/12/2013 10:45 AM  Signature: 03/12/2013 10:45 AM  Signature:    Signature:    Signature:      Scribe for Treatment Team:   [name],  03/12/2013 10:45 AM

## 2013-03-12 NOTE — [facility] Suicide Risk Assessment (Signed)
Suicide Risk Assessment  Discharge Assessment     Demographic Factors:  NA  Mental Status Per Nursing Assessment::   On Admission:  NA  Current Mental Status by Physician: In full contact with reality. Sates she never had ideas to hurt herself or her baby. She was mainly experiencing panic attacks with agoraphobia. She is denying suicidal ideas, plans or intent. Her husband is present and validates this information. She states she is ready to go home. Feels more down listening to other people's problems. States she will better at home with her family. The husband states that she is not going to be left alone. Between him and other family members she is going to have somebody supporting her all the time.   Loss Factors: NA  Historical Factors: NA  Risk Reduction Factors:   Responsible for children under 38 years of age, Sense of responsibility to family and Positive social support  Continued Clinical Symptoms:  Panic Attacks Depression:   Insomnia  Cognitive Features That Contribute To Risk: None idetified   Suicide Risk:  Minimal: No identifiable suicidal ideation.  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 Hypertension   . Depression 01/2013   AXIS IV:  none identified AXIS V:  61-70 mild symptoms  Plan Of Care/Follow-up recommendations:  Activity:  as tolerated Diet:  regular Will pursue further outpatient follow up Is patient on multiple antipsychotic therapies at discharge:  No   Has Patient had three or more failed trials of antipsychotic monotherapy by history:  No  Recommended Plan for Multiple Antipsychotic Therapies: N/A   Ronya Gilcrest A 03/12/2013, 12:18 PM

## 2013-03-12 NOTE — Discharge Summary (Signed)
Physician Discharge Summary Note  Patient:  Michelle Christian is an 27 y.o., female MRN:  [id number] DOB:  1986-03-19 Patient phone:  [phone] (home)  Patient address:   [address] [place] [address],   Date of Admission:  03/09/2013 Date of Discharge: 03/12/2013  Reason for Admission:  Depression with homicidal ideations towards her 38 month old son  Discharge Diagnoses: Principal Problem:   Major depression, recurrent Active Problems:   Anxiety state, unspecified  Review of Systems  Constitutional: Negative.   HENT: Negative.   Eyes: Negative.   Respiratory: Negative.   Cardiovascular: Negative.   Gastrointestinal: Negative.   Genitourinary: Negative.   Musculoskeletal: Negative.   Skin: Negative.   Neurological: Negative.   Endo/Heme/Allergies: Negative.   Psychiatric/Behavioral: Positive for depression.   Axis Diagnosis:   AXIS I:  Anxiety, post-partum and Depression, Post-Partum AXIS II:  Deferred AXIS III:   Past Medical History  Diagnosis Date  . Hypertension   . Depression 01/2013   AXIS IV:  other psychosocial or environmental problems, problems related to social environment and problems with primary support group AXIS V:  61-70 mild symptoms  Level of Care:  OP  Hospital Course:  On admission:  27 y.o. female that presents to [facility] with her family reporting increasing depression over the last 17 days, and feelings of hopelessness and helplessness. An interpreter was used for this assessment, as pt understands little Michelle Christian and only speaks Michelle Christian. Pt reported HI toward her 48 month old son to the ED nurse and to the telepsychiatrist. A telepsych was ordered and inpatient treatment recommended and medication recommendations were made. Pt reports sadness, crying spells, lack of sleep because she is attending to the baby, and feeling "desperate." Pt reports no other stressors. Pt stated she hates feeling this way. Pt stated she has felt depressed before, but  not to this extent, and also the HI scares her. Pt tearful and cooperative during assessment. Pt endorses anxiety with recent panic attacks as well as being afraid to drive or leave her home. Pt denies psychosis. Pt denies SA. Pt has no previous MH/SA history. Pt was prescribed Sertraline and Paroxetine by her PCP at the end of April, but pt reported even though she takes them as prescribed, they have not helped. Pt reported she knows she needs help and is motivated for treatment. Pt lives at home with her spouse and children (she also has an 59 yr old) and doesn't work. Pt reports her family is supportive and no other current stressors other then the baby.  Today the assessment is completed with the help of a language interpreter. Patient reports feeling "nervous, tense and having no motivation to do anything for the last week." The patient reports feeling fine prior to this time. She is not able to name any stressors and denies that anything upsetting has happened recently. Michelle Christian reports that things between her husband and her are "perfect, We never even fight." Michelle Christian also reports having any intent to hurt herself or her child. She reports that her PCP started her on paxil one week ago and has felt "some better." Her main complaint is disrupted sleep from her infant son over the last two months. Patient states "He was been waking up every two hours so it's hard to rest well." The patient [place] and came to the [place] in 2003. Patient states "I want to feel well so I can care for my family."   During hospitalization:  Medication managed--Claritin and Aldomet continued from her home medications, Zoloft and Paxil had not worked for her--changed to Risperdal 0.5 mg BID for her agitation.  Her depression decreased after getting sleep and rest, she will get help from her aunt and uncle with her children to decrease her stress and increase her sleep.  Husband is also supportive, met with the  MD prior to discharge and feels she is ready to discharge also.  Patient denied suicidal/homicidal ideations and auditory/visual hallucinations, follow-up appointments encouraged to attend, Rx and 14 day supply of medications.  Michelle Christian is stable physically and mentally for discharge.  Consults:  None  Significant Diagnostic Studies:  labs: Completed and reviewed, stable  Discharge Vitals:   Blood pressure 123/90, pulse 109, temperature 98.3 F (36.8 C), temperature source Oral, resp. rate 18, height 5\' 2"  (1.575 m), weight 79.833 kg (176 lb). Body mass index is 32.18 kg/(m^2). Lab Results:   Results for orders placed during the hospital encounter of 03/09/13 (from the past 72 hour(s))  URINE RAPID DRUG SCREEN (HOSP PERFORMED)     Status: None   Collection Time    03/09/13  1:15 PM      Result Value Range   Opiates NONE DETECTED  NONE DETECTED   Cocaine NONE DETECTED  NONE DETECTED   Benzodiazepines NONE DETECTED  NONE DETECTED   Amphetamines NONE DETECTED  NONE DETECTED   Tetrahydrocannabinol NONE DETECTED  NONE DETECTED   Barbiturates NONE DETECTED  NONE DETECTED   Comment:            DRUG SCREEN FOR MEDICAL PURPOSES     ONLY.  IF CONFIRMATION IS NEEDED     FOR ANY PURPOSE, NOTIFY LAB     WITHIN 5 DAYS.                LOWEST DETECTABLE LIMITS     FOR URINE DRUG SCREEN     Drug Class       Cutoff (ng/mL)     Amphetamine      1000     Barbiturate      200     Benzodiazepine   200     Tricyclics       300     Opiates          300     Cocaine          300     THC              50  ACETAMINOPHEN LEVEL     Status: None   Collection Time    03/09/13  1:26 PM      Result Value Range   Acetaminophen (Tylenol), Serum <15.0  10 - 30 ug/mL   Comment:            THERAPEUTIC CONCENTRATIONS VARY     SIGNIFICANTLY. A RANGE OF 10-30     ug/mL MAY BE AN EFFECTIVE     CONCENTRATION FOR MANY PATIENTS.     HOWEVER, SOME ARE BEST TREATED     AT CONCENTRATIONS OUTSIDE THIS     RANGE.      ACETAMINOPHEN CONCENTRATIONS     >150 ug/mL AT 4 HOURS AFTER     INGESTION AND >50 ug/mL AT 12     HOURS AFTER INGESTION ARE     OFTEN ASSOCIATED WITH TOXIC     REACTIONS.  ETHANOL     Status: None   Collection Time    03/09/13  1:26  PM      Result Value Range   Alcohol, Ethyl (B) <11  0 - 11 mg/dL   Comment:            LOWEST DETECTABLE LIMIT FOR     SERUM ALCOHOL IS 11 mg/dL     FOR MEDICAL PURPOSES ONLY  CBC     Status: None   Collection Time    03/09/13  1:26 PM      Result Value Range   WBC 8.7  4.0 - 10.5 K/uL   RBC 4.52  3.87 - 5.11 MIL/uL   Hemoglobin 13.1  12.0 - 15.0 g/dL   HCT 16.1  09.6 - 04.5 %   MCV 88.3  78.0 - 100.0 fL   MCH 29.0  26.0 - 34.0 pg   MCHC 32.8  30.0 - 36.0 g/dL   RDW 40.9  81.1 - 91.4 %   Platelets 188  150 - 400 K/uL  COMPREHENSIVE METABOLIC PANEL     Status: Abnormal   Collection Time    03/09/13  1:26 PM      Result Value Range   Sodium 139  135 - 145 mEq/L   Potassium 3.9  3.5 - 5.1 mEq/L   Chloride 104  96 - 112 mEq/L   CO2 24  19 - 32 mEq/L   Glucose, Bld 97  70 - 99 mg/dL   BUN 8  6 - 23 mg/dL   Creatinine, Ser 7.82  0.50 - 1.10 mg/dL   Calcium 9.3  8.4 - 95.6 mg/dL   Total Protein 7.4  6.0 - 8.3 g/dL   Albumin 4.1  3.5 - 5.2 g/dL   AST 24  0 - 37 U/L   ALT 45 (*) 0 - 35 U/L   Alkaline Phosphatase 81  39 - 117 U/L   Total Bilirubin 0.3  0.3 - 1.2 mg/dL   GFR calc non Af Amer >90  >90 mL/min   GFR calc Af Amer >90  >90 mL/min   Comment:            The eGFR has been calculated     using the CKD EPI equation.     This calculation has not been     validated in all clinical     situations.     eGFR's persistently     <90 mL/min signify     possible Chronic Kidney Disease.  SALICYLATE LEVEL     Status: Abnormal   Collection Time    03/09/13  1:26 PM      Result Value Range   Salicylate Lvl <2.0 (*) 2.8 - 20.0 mg/dL  POCT PREGNANCY, URINE     Status: None   Collection Time    03/09/13  1:29 PM      Result Value Range    Preg Test, Ur NEGATIVE  NEGATIVE   Comment:            THE SENSITIVITY OF THIS     METHODOLOGY IS >24 mIU/mL    Physical Findings: AIMS: Facial and Oral Movements Muscles of Facial Expression: None, normal Lips and Perioral Area: None, normal Jaw: None, normal Tongue: None, normal,Extremity Movements Upper (arms, wrists, hands, fingers): None, normal Lower (legs, knees, ankles, toes): None, normal, Trunk Movements Neck, shoulders, hips: None, normal, Overall Severity Severity of abnormal movements (highest score from questions above): None, normal Incapacitation due to abnormal movements: None, normal Patient's awareness of abnormal movements (rate only patient's report): No Awareness, Dental Status  Current problems with teeth and/or dentures?: No Does patient usually wear dentures?: No  CIWA:    COWS:     Psychiatric Specialty Exam: See Psychiatric Specialty Exam and Suicide Risk Assessment completed by Attending Physician prior to discharge.  Discharge destination:  Home  Is patient on multiple antipsychotic therapies at discharge:  No   Has Patient had three or more failed trials of antipsychotic monotherapy by history:  No  Recommended Plan for Multiple Antipsychotic Therapies:  N/A  Discharge Orders   Future Appointments Provider Department Dept Phone   03/18/2013 10:45 AM [place] [facility] [phone]   Future Orders Complete By Expires     Activity as tolerated - No restrictions  As directed     Diet - low sodium heart healthy  As directed         Medication List    STOP taking these medications       multivitamin capsule     PARoxetine 10 MG tablet  Commonly known as:  PAXIL      TAKE these medications     Indication   loratadine 10 MG tablet  Commonly known as:  CLARITIN  Take 10 mg by mouth daily as needed for allergies.      methyldopa 250 MG tablet  Commonly known as:  ALDOMET  Take 1 tablet (250 mg total) by mouth 2 (two)  times daily.   Indication:  High Blood Pressure     prenatal multivitamin Tabs  Take 1 tablet by mouth daily.   Indication:  post-pregnancy     risperiDONE 0.5 MG tablet  Commonly known as:  RISPERDAL  Take 1 tablet (0.5 mg total) by mouth 2 (two) times daily.   Indication:  Easily Angered or Annoyed           Follow-up Information   Follow up with Dr. Quintella Reichert  On 03/15/2013. (Please go the walk-in clinic on Monday, Mar 15, 2013 between 8AM-12:00)    Contact information:   [address] [place], [place]   [address]  [phone]      Follow-up recommendations:  Activity:  As tolerated Diet:  Low-sodium heart healthy diet  Comments:  Patient will continue her care at Dr Quintella Reichert.  Total Discharge Time:  Greater than 30 minutes.  SignedNanine Means, PMH-NP 03/12/2013, 10:44 AM

## 2013-03-12 NOTE — Progress Notes (Signed)
[facility] Case Management Discharge Plan :  Will you be returning to the same living situation after discharge: Yes,  Patient returning home with family At discharge, do you have transportation home?:Yes,  Husband will transport patient home. Do you have the ability to pay for your medications:No.Patient assisted with indigent medications.  Release of information consent forms completed and in the chart;  Patient's signature needed at discharge.  Patient to Follow up at: Follow-up Information   Follow up with Dr. Quintella Reichert  On 03/15/2013. (Please go the walk-in clinic on Monday, Mar 15, 2013 between 8AM-12:00)    Contact information:   [address] [place], [place]   [address]  [phone]      Patient denies SI/HI:  Yes, patient denies SI/HI or other thoughts of self harm.  Safety Planning and Suicide Prevention discussed: Reviewed with all patient during aftercare group. Wynn Banker 03/12/2013, 10:43 AM

## 2013-03-12 NOTE — BHH Group Notes (Signed)
[facility] LCSW Aftercare Discharge Planning Group Note   03/12/2013 10:10 AM  Participation Quality:  Appropriate   Mood/Affect:  Appropriate  Depression Rating:  1  Anxiety Rating:  0  Thoughts of Suicide:  No  Will you contract for safety?   NA  Current AVH:  No  Plan for Discharge/Comments:  Patient reports doing well and wanting to discharge home today.  She advised husband will come for meeting at 10:30.  Interpreter was present during discussion.   Fedra Lanter, Joesph July

## 2013-03-12 NOTE — Plan of Care (Signed)
Problem: Alteration in mood Goal: LTG-Pt's behavior demonstrates decreased signs of depression (Patient's behavior demonstrates decreased signs of depression to the point the patient is safe to return home and continue treatment in an outpatient setting)  Outcome: Completed/Met Date Met:  03/12/13 Patient has attended groups in interpreter present and engaged in discussion.  She currently rates depression at one and feels ready to discharge home.  [name], LCSW' 03/12/2013'

## 2013-03-12 NOTE — Plan of Care (Signed)
Problem: Ineffective individual coping Goal: STG: Patient will participate in after care plan Outcome: Completed/Met Date Met:  03/12/13 Patient has attended groups.  She will follow up with PCP as unable to scheduled undocumented patients for local mental health.  Horace Porteous Pasha Gadison, LCSW 03/12/2013

## 2013-03-17 NOTE — Progress Notes (Signed)
Patient Discharge Instructions:  After Visit Summary (AVS):   Faxed to:  03/17/13 Discharge Summary Note:   Faxed to:  03/17/13 Psychiatric Admission Assessment Note:   Faxed to:  03/17/13 Suicide Risk Assessment - Discharge Assessment:   Faxed to:  03/17/13 Faxed/Sent to the Next Level Care provider:  03/17/13 Faxed to Dr. Quintella Reichert @ [phone]  Jerelene Redden, 03/17/2013, 2:07 PM

## 2013-03-18 ENCOUNTER — Ambulatory Visit (INDEPENDENT_AMBULATORY_CARE_PROVIDER_SITE_OTHER): Payer: Self-pay

## 2013-03-18 VITALS — BP 128/94 | HR 94 | Wt 182.8 lb

## 2013-03-18 DIAGNOSIS — Z3049 Encounter for surveillance of other contraceptives: Secondary | ICD-10-CM

## 2013-03-18 MED ORDER — MEDROXYPROGESTERONE ACETATE 150 MG/ML IM SUSP
150.0000 mg | Freq: Once | INTRAMUSCULAR | Status: AC
  Administered 2013-03-18: 150 mg via INTRAMUSCULAR

## 2013-03-25 ENCOUNTER — Emergency Department (HOSPITAL_COMMUNITY)
Admission: EM | Admit: 2013-03-25 | Discharge: 2013-03-25 | Disposition: A | Discharge status: Home / Self Care | Payer: Self-pay | Attending: Emergency Medicine | Admitting: Emergency Medicine

## 2013-03-25 ENCOUNTER — Encounter (HOSPITAL_COMMUNITY): Payer: Self-pay | Admitting: Emergency Medicine

## 2013-03-25 DIAGNOSIS — F419 Anxiety disorder, unspecified: Secondary | ICD-10-CM

## 2013-03-25 DIAGNOSIS — I10 Essential (primary) hypertension: Secondary | ICD-10-CM | POA: Insufficient documentation

## 2013-03-25 DIAGNOSIS — F411 Generalized anxiety disorder: Secondary | ICD-10-CM | POA: Insufficient documentation

## 2013-03-25 DIAGNOSIS — R209 Unspecified disturbances of skin sensation: Secondary | ICD-10-CM | POA: Insufficient documentation

## 2013-03-25 DIAGNOSIS — R259 Unspecified abnormal involuntary movements: Secondary | ICD-10-CM | POA: Insufficient documentation

## 2013-03-25 DIAGNOSIS — Z79899 Other long term (current) drug therapy: Secondary | ICD-10-CM | POA: Insufficient documentation

## 2013-03-25 DIAGNOSIS — F3289 Other specified depressive episodes: Secondary | ICD-10-CM | POA: Insufficient documentation

## 2013-03-25 DIAGNOSIS — F329 Major depressive disorder, single episode, unspecified: Secondary | ICD-10-CM | POA: Insufficient documentation

## 2013-03-25 NOTE — ED Notes (Signed)
Per pt's boyfriend pt states she feels scared and like crying. Per pt's boyfriend pt states she has pain down her left arm. Per pt's boyfriend pt has a hx of depression.

## 2013-03-25 NOTE — ED Provider Notes (Signed)
I saw and evaluated the patient, reviewed the resident's note including ECG interpretation and I agree with the findings and plan.  Transient spell anxiety with generalized weakness generalized shaking slight tingling and numbness to both hands which is now resolved she is not a threat to herself or others not suicidal homicidal or hallucinating.  Hurman Horn, MD 03/25/13 2212

## 2013-03-25 NOTE — ED Provider Notes (Signed)
History     CSN: [id number]  Arrival date & time 03/25/13  4098   First MD Initiated Contact with Patient 03/25/13 [phone]      Chief Complaint  Patient presents with  . Anxiety    (Consider location/radiation/quality/duration/timing/severity/associated sxs/prior treatment) HPI Comments: 27 y.o. female who presents to the ER w/ cc of arms "tingling" and "shakiness", and stating she is concerned she is having a reaction to risperdal. Pt has hx of depression. She ademently denies SI and HI, but is tearful on exam, stating she is "anxious".  She has been taking risperdal for the past 2 weeks without issues. She is not having chest pain, she is not having back pain, or neck pain.   Patient is a 27 y.o. female presenting with general illness. The history is provided by the patient. The history is limited by a language barrier. A language interpreter was used.  Illness Severity:  Mild Onset quality:  Gradual Timing:  Constant Progression:  Improving Chronicity:  New Associated symptoms: no abdominal pain, no chest pain, no congestion, no cough, no diarrhea, no fatigue, no fever, no headaches, no rash, no vomiting and no wheezing     Past Medical History  Diagnosis Date  . Hypertension   . Depression 01/2013    Past Surgical History  Procedure Laterality Date  . No past surgeries      Family History  Problem Relation Age of Onset  . Hypertension Maternal Grandmother   . Hypertension Maternal Grandfather   . Anesthesia problems Neg Hx   . Hypotension Neg Hx   . Malignant hyperthermia Neg Hx   . Pseudochol deficiency Neg Hx     History  Substance Use Topics  . Smoking status: Never Smoker   . Smokeless tobacco: Never Used  . Alcohol Use: No    OB History   Grav Para Term Preterm Abortions TAB SAB Ect Mult Living   2 2 2       2       Review of Systems  Constitutional: Negative for fever, chills and fatigue.  HENT: Negative for congestion, facial swelling, drooling,  neck pain and dental problem.   Eyes: Negative for pain, discharge and itching.  Respiratory: Negative for cough, choking, wheezing and stridor.   Cardiovascular: Negative for chest pain.  Gastrointestinal: Negative for vomiting, abdominal pain and diarrhea.  Endocrine: Negative for cold intolerance and heat intolerance.  Genitourinary: Negative for vaginal discharge, difficulty urinating and vaginal pain.  Skin: Negative for pallor and rash.  Neurological: Negative for dizziness, light-headedness and headaches.  Psychiatric/Behavioral: Negative for behavioral problems and agitation.    Allergies  Review of patient's allergies indicates no known allergies.  Home Medications   Current Outpatient Rx  Name  Route  Sig  Dispense  Refill  . methyldopa (ALDOMET) 250 MG tablet   Oral   Take 1 tablet (250 mg total) by mouth 2 (two) times daily.   60 tablet   0   . montelukast (SINGULAIR) 10 MG tablet   Oral   Take 10 mg by mouth daily.         . Prenatal Vit-Fe Fumarate-FA (PRENATAL MULTIVITAMIN) TABS   Oral   Take 1 tablet by mouth daily.   30 tablet   0   . risperiDONE (RISPERDAL) 0.5 MG tablet   Oral   Take 1 tablet (0.5 mg total) by mouth 2 (two) times daily.   60 tablet   0     BP 140/100  Pulse 97  Temp(Src) 98.2 F (36.8 C) (Oral)  Resp 16  SpO2 100%  Physical Exam  Constitutional: She is oriented to person, place, and time. She appears well-developed. No distress.  HENT:  Head: Normocephalic and atraumatic.  Eyes: Pupils are equal, round, and reactive to light. Right eye exhibits no discharge. Left eye exhibits no discharge.  Neck: Neck supple. No tracheal deviation present.  Cardiovascular: Normal rate.  Exam reveals no gallop and no friction rub.   Pulmonary/Chest: No stridor. No respiratory distress. She has no wheezes.  Abdominal: Soft. She exhibits no distension. There is no tenderness. There is no rebound.  Musculoskeletal: She exhibits no edema and  no tenderness.  Neurological: She is alert and oriented to person, place, and time. She has normal reflexes. She displays normal reflexes. No cranial nerve deficit. Coordination normal.  Pt is able to ambulate in the room without issues, normal gait, normal heel to toe. 5/5 strength UE and LE. No paresthesias on exam or sensory deficits on exam.   Skin: Skin is warm. She is not diaphoretic.    ED Course  Procedures (including critical care time)  Labs Reviewed - No data to display No results found.  ECG shows rate of 96, normal sinus, no pathologic inverted T waves, no pathologic ST wave changes.   MDM  Pt with no neuro deficits on exam. She denies SI and HI. Doubt her symptoms secondary to medication rxn with patient being on risperdal for the past 2 weeks w/o issues, and no new meds. ECG without acute pathology, doubt acs.   Suspect her symptoms are related to her anxiety, have told her to f/u with her mental health team for further evaluation. On my exam, pt is not tachycardic, HR in the 90s, and her other vital signs are within normal limits.   1. Anxiety            Marlyn Rabine Donette Larry, MD 03/25/13 1526

## 2013-04-01 ENCOUNTER — Encounter (HOSPITAL_COMMUNITY)
Admission: RE | Admit: 2013-04-01 | Discharge: 2013-04-01 | Disposition: A | Discharge status: Home / Self Care | Payer: Self-pay | Source: Ambulatory Visit | Attending: Family Medicine | Admitting: Family Medicine

## 2013-04-01 DIAGNOSIS — O923 Agalactia: Secondary | ICD-10-CM | POA: Insufficient documentation

## 2013-06-03 ENCOUNTER — Ambulatory Visit (INDEPENDENT_AMBULATORY_CARE_PROVIDER_SITE_OTHER): Payer: Self-pay | Admitting: General Practice

## 2013-06-03 VITALS — BP 108/73 | HR 65 | Temp 97.0°F | Ht 62.0 in | Wt 192.0 lb

## 2013-06-03 DIAGNOSIS — IMO0001 Reserved for inherently not codable concepts without codable children: Secondary | ICD-10-CM

## 2013-06-03 DIAGNOSIS — Z3049 Encounter for surveillance of other contraceptives: Secondary | ICD-10-CM

## 2013-06-03 MED ORDER — MEDROXYPROGESTERONE ACETATE 150 MG/ML IM SUSP
150.0000 mg | Freq: Once | INTRAMUSCULAR | Status: AC
  Administered 2013-06-03: 150 mg via INTRAMUSCULAR

## 2013-08-19 ENCOUNTER — Ambulatory Visit (INDEPENDENT_AMBULATORY_CARE_PROVIDER_SITE_OTHER): Payer: Self-pay | Admitting: General Practice

## 2013-08-19 VITALS — BP 117/86 | HR 66 | Temp 96.8°F | Ht 62.0 in | Wt 195.4 lb

## 2013-08-19 DIAGNOSIS — Z3049 Encounter for surveillance of other contraceptives: Secondary | ICD-10-CM

## 2013-08-19 DIAGNOSIS — IMO0001 Reserved for inherently not codable concepts without codable children: Secondary | ICD-10-CM

## 2013-08-19 MED ORDER — MEDROXYPROGESTERONE ACETATE 104 MG/0.65ML ~~LOC~~ SUSP
104.0000 mg | Freq: Once | SUBCUTANEOUS | Status: AC
  Administered 2013-08-19: 104 mg via SUBCUTANEOUS

## 2013-08-20 ENCOUNTER — Ambulatory Visit: Payer: Self-pay

## 2013-10-17 IMAGING — US US OB FOLLOW-UP
1 series · 12 of 28 positions shown · non-contrast
Comparison: none

[Series 1: us ob follow up · 12 of 34 slices shown]
[im 2/34]
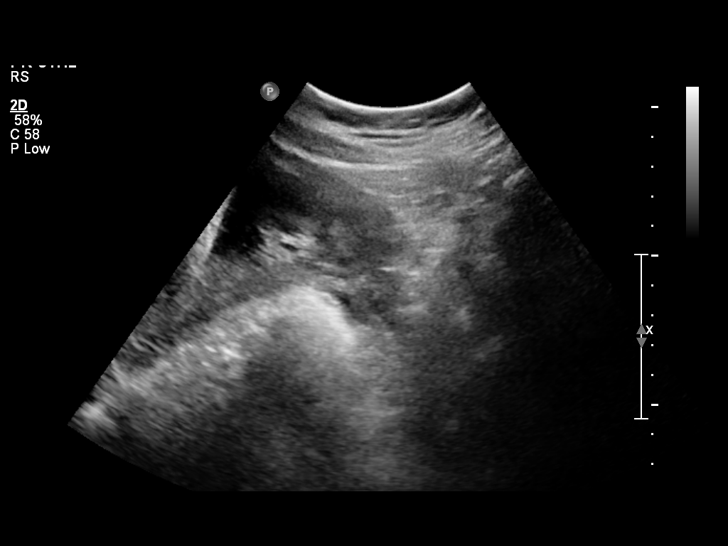
[im 4/34]
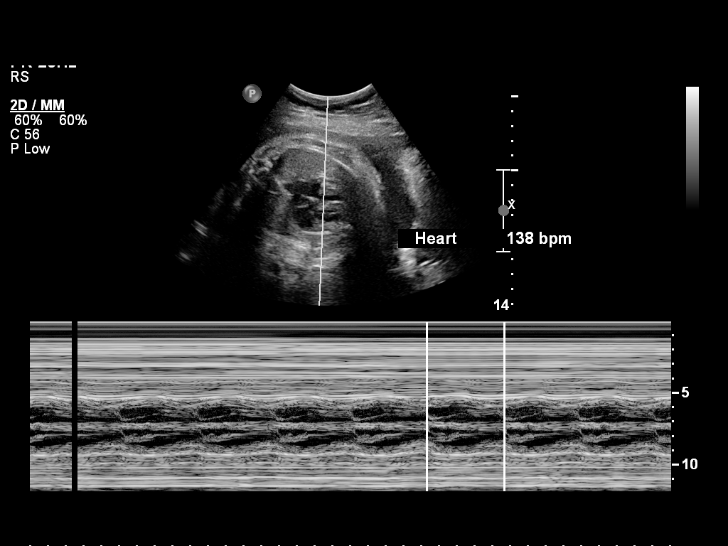
[im 7/34]
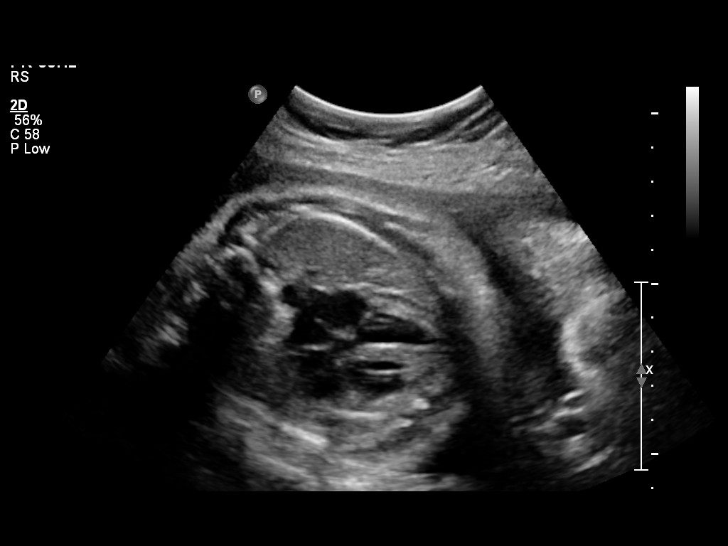
[im 10/34]
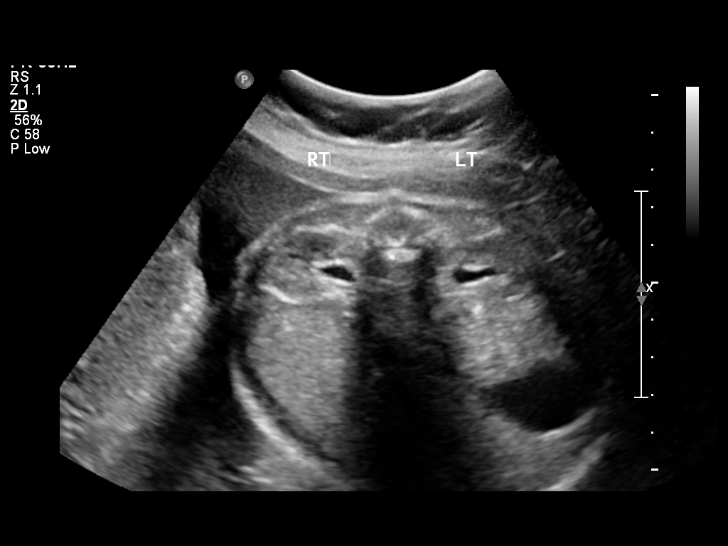
[im 13/34]
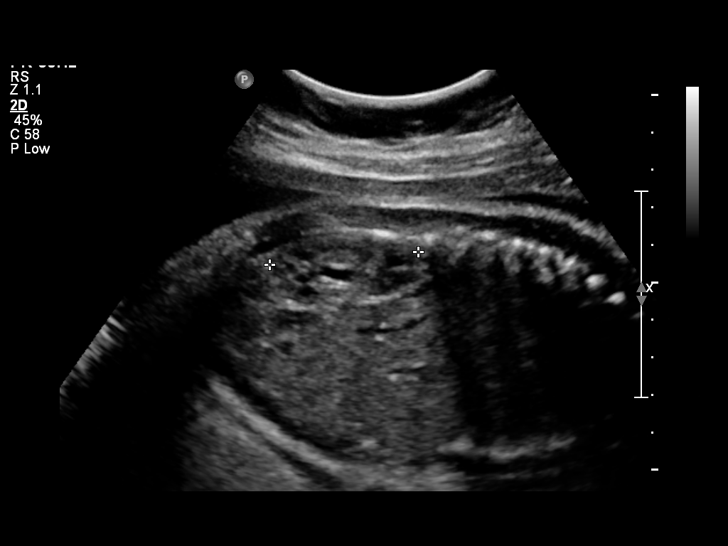
[im 15/34]
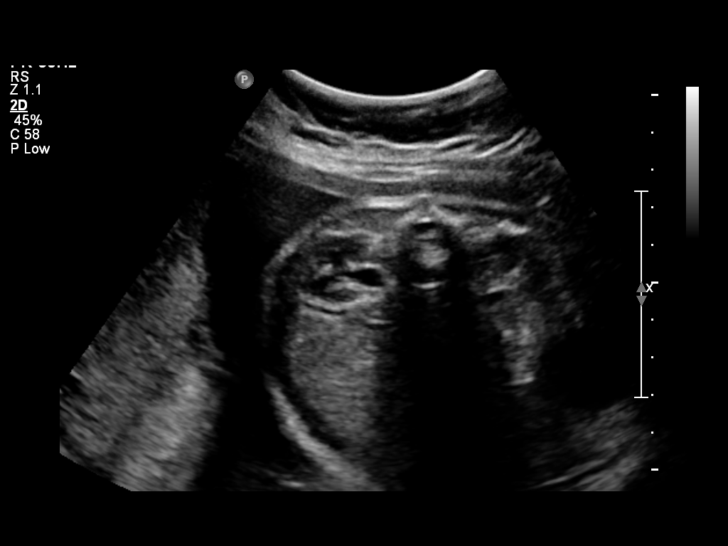
[im 19/34]
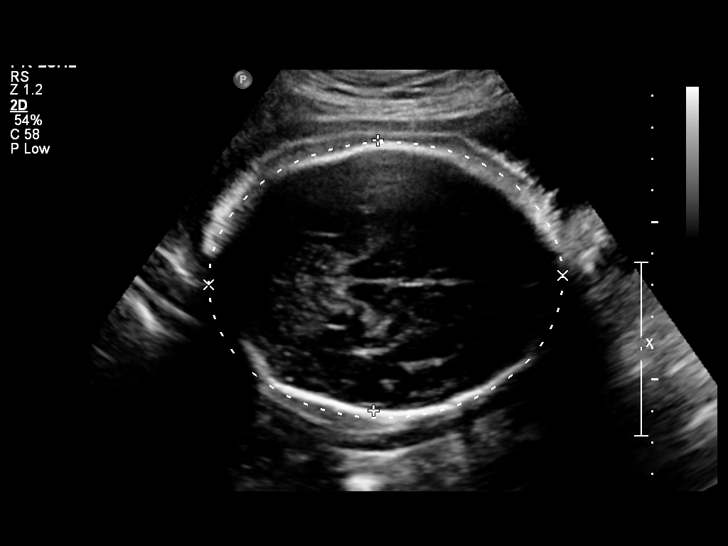
[im 21/34]
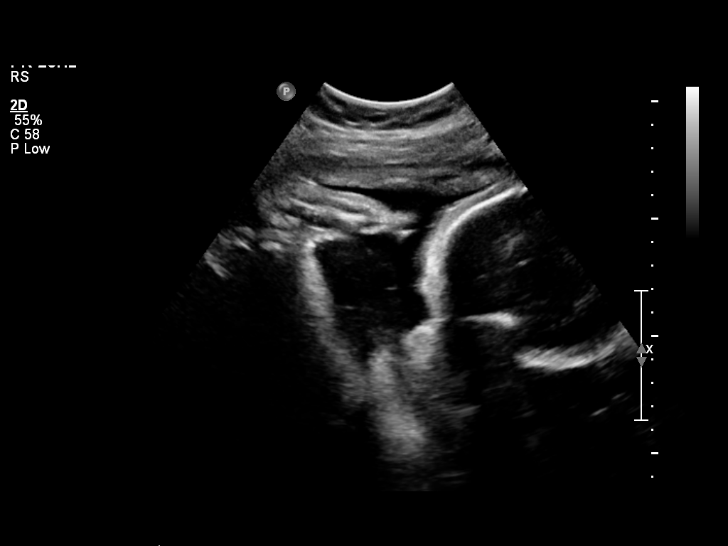
[im 24/34]
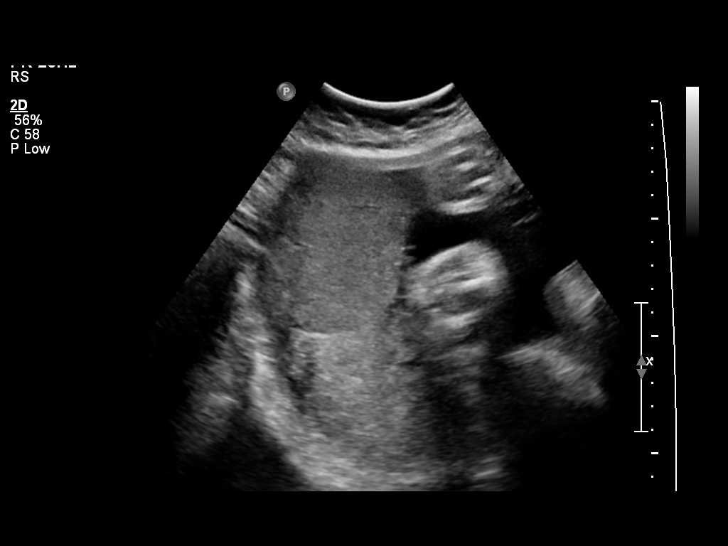
[im 27/34]
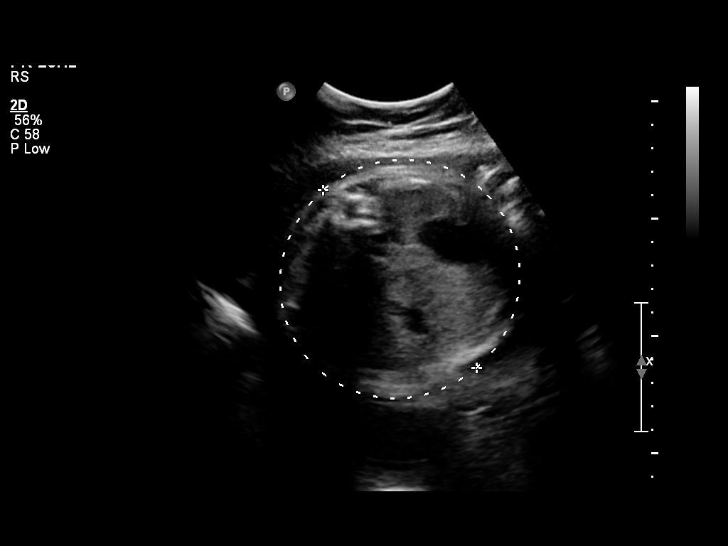
[im 30/34]
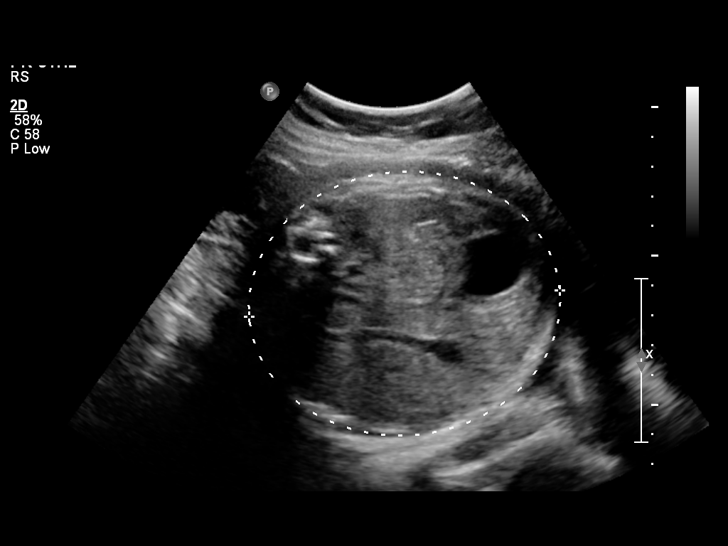
[im 32/34]
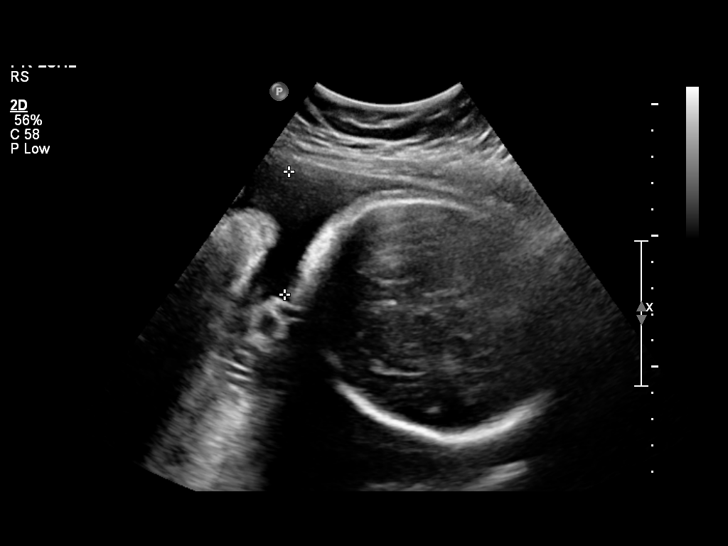

[12 of 28 positions shown; findings below may reference images not displayed]

OBSTETRICS REPORT
                      (Signed Final 12/16/2011 [DATE])

 Order#:         [id number]
Procedures

 US OB FOLLOW UP                                       76816.1
Indications

 Hypertension - Chronic/Pre-existing (Currently
 treated with Methyldopa)
 Follow up Fetal Pyelectasis
Fetal Evaluation

 Fetal Heart Rate:  138                         bpm
 Cardiac Activity:  Observed
 Presentation:      Cephalic
 Placenta:          Posterior, above cervical
                    os
 P. Cord            Previously Visualized
 Insertion:

 Amniotic Fluid
 AFI FV:      Subjectively within normal limits
 AFI Sum:     15.57   cm      56   %Tile     Larg Pckt:   4.83   cm
 RUQ:   3.78   cm    RLQ:    4.83   cm    LUQ:   4.67    cm   LLQ:    2.29   cm
Biometry

 BPD:     85.3  mm    G. Age:   34w 2d                CI:        74.57   70 - 86
                                                      FL/HC:      21.5   20.1 -

 HC:     313.5  mm    G. Age:   35w 1d       26  %    HC/AC:      1.02   0.93 -

 AC:     308.4  mm    G. Age:   34w 5d       58  %    FL/BPD:     79.0   71 - 87
 FL:      67.4  mm    G. Age:   34w 5d       41  %    FL/AC:      21.9   20 - 24

 Est. FW:    1080  gm      5 lb 8 oz     61  %
Gestational Age

 LMP:           34w 5d       Date:   04/17/11                 EDD:   01/22/12
 U/S Today:     34w 5d                                        EDD:   01/22/12
 Best:          34w 5d    Det. By:   LMP  (04/17/11)          EDD:   01/22/12
Anatomy
 Cranium:           Appears normal      Aortic Arch:       Previously seen
 Fetal Cavum:       Appears normal      Ductal Arch:       Previously seen
 Ventricles:        Appears normal      Diaphragm:         Appears normal
 Choroid Plexus:    Previously seen     Stomach:           Appears
                                                           normal, left
                                                           sided
 Cerebellum:        Previously seen     Abdomen:           Appears normal
 Posterior Fossa:   Previously seen     Abdominal Wall:    Previously seen
 Nuchal Fold:       Not applicable      Cord Vessels:      Previously seen
                    (>20 wks GA)
 Face:              Previously seen     Kidneys:           Appear normal
 Heart:             Appears normal      Bladder:           Appears normal
                    (4 chamber &
                    axis)
 RVOT:              Previously seen     Spine:             Previously seen
 LVOT:              Previously seen     Limbs:             Previously seen

 Other:     Female gender previously visualized. Heels and 5th digit
            previously visualized. Technically difficult due to fetal
            position.
Cervix Uterus Adnexa

 Cervix:       Not visualized (advanced GA >34 wks)

 Adnexa:     No abnormality visualized.
Impression

 Assigned GA is currently 34w 5d.   Appropriate fetal growth,
 with EFW at 61 %ile.
 No persistent fetal renal pyelectasis.  No late developing fetal
 abnormalities seen involving visualized anatomy.
 Amniotic fluid within normal limits, with AFI of 15.57 cm.

 questions or concerns.

## 2013-11-11 ENCOUNTER — Ambulatory Visit (INDEPENDENT_AMBULATORY_CARE_PROVIDER_SITE_OTHER): Payer: Self-pay

## 2013-11-11 VITALS — BP 129/88 | HR 74 | Ht 63.0 in | Wt 196.5 lb

## 2013-11-11 DIAGNOSIS — Z3049 Encounter for surveillance of other contraceptives: Secondary | ICD-10-CM

## 2013-11-11 MED ORDER — MEDROXYPROGESTERONE ACETATE 104 MG/0.65ML ~~LOC~~ SUSP
104.0000 mg | Freq: Once | SUBCUTANEOUS | Status: AC
  Administered 2013-11-11: 104 mg via SUBCUTANEOUS

## 2014-02-03 ENCOUNTER — Ambulatory Visit (INDEPENDENT_AMBULATORY_CARE_PROVIDER_SITE_OTHER): Payer: Self-pay

## 2014-02-03 VITALS — BP 140/87 | HR 79 | Temp 97.1°F | Wt 195.1 lb

## 2014-02-03 DIAGNOSIS — Z3049 Encounter for surveillance of other contraceptives: Secondary | ICD-10-CM

## 2014-02-03 MED ORDER — MEDROXYPROGESTERONE ACETATE 104 MG/0.65ML ~~LOC~~ SUSP
104.0000 mg | Freq: Once | SUBCUTANEOUS | Status: AC
  Administered 2014-02-03: 104 mg via SUBCUTANEOUS

## 2014-04-28 ENCOUNTER — Ambulatory Visit (INDEPENDENT_AMBULATORY_CARE_PROVIDER_SITE_OTHER): Payer: Self-pay | Admitting: *Deleted

## 2014-04-28 VITALS — BP 125/84 | HR 62

## 2014-04-28 DIAGNOSIS — Z3049 Encounter for surveillance of other contraceptives: Secondary | ICD-10-CM

## 2014-04-28 MED ORDER — MEDROXYPROGESTERONE ACETATE 104 MG/0.65ML ~~LOC~~ SUSP
104.0000 mg | Freq: Once | SUBCUTANEOUS | Status: AC
  Administered 2014-04-28: 104 mg via SUBCUTANEOUS

## 2014-05-05 ENCOUNTER — Ambulatory Visit: Payer: Self-pay

## 2014-08-18 ENCOUNTER — Ambulatory Visit: Payer: Self-pay | Admitting: Obstetrics & Gynecology

## 2014-08-18 ENCOUNTER — Encounter: Payer: Self-pay | Admitting: Obstetrics & Gynecology

## 2014-08-18 NOTE — Progress Notes (Signed)
Patient scheduled for annual exam but given insurance constraints, patient was referred to [facility] Pap [facility]ear clinics.  Not seen in this clinic today.  Michelle Christian  [name], MD, FACOG Attending Obstetrician & Gynecologist Center for [facility], [facility]

## 2014-08-29 ENCOUNTER — Encounter: Payer: Self-pay | Admitting: Obstetrics & Gynecology

## 2015-02-10 ENCOUNTER — Emergency Department (INDEPENDENT_AMBULATORY_CARE_PROVIDER_SITE_OTHER)
Admission: EM | Admit: 2015-02-10 | Discharge: 2015-02-10 | Disposition: A | Discharge status: Home / Self Care | Payer: Self-pay | Source: Home / Self Care | Attending: Family Medicine | Admitting: Family Medicine

## 2015-02-10 ENCOUNTER — Encounter (HOSPITAL_COMMUNITY): Payer: Self-pay | Admitting: Emergency Medicine

## 2015-02-10 DIAGNOSIS — F329 Major depressive disorder, single episode, unspecified: Secondary | ICD-10-CM

## 2015-02-10 DIAGNOSIS — S161XXA Strain of muscle, fascia and tendon at neck level, initial encounter: Secondary | ICD-10-CM

## 2015-02-10 DIAGNOSIS — F32A Depression, unspecified: Secondary | ICD-10-CM

## 2015-02-10 MED ORDER — METAXALONE 800 MG PO TABS: 800.0000 mg | ORAL_TABLET | Freq: Three times a day (TID) | ORAL | Status: DC

## 2015-02-10 NOTE — ED Provider Notes (Signed)
CSN: [id number]     Arrival date & time 02/10/15  1114 History   First MD Initiated Contact with Patient 02/10/15 1254     Chief Complaint  Patient presents with  . Neck Pain   (Consider location/radiation/quality/duration/timing/severity/associated sxs/prior Treatment) Patient is a 29 y.o. female presenting with neck pain. The history is provided by the patient. The history is limited by a language barrier. A language interpreter was used (ramon translated).  Neck Pain Pain location:  R side Quality:  Stiffness Stiffness is present:  In the morning Pain radiates to:  Does not radiate Pain severity:  Mild Onset quality:  Sudden (on awakening this am.) Timing:  Constant Progression:  Unchanged Chronicity:  New Context: not recent injury   Relieved by:  None tried Worsened by:  Nothing tried Ineffective treatments:  None tried Associated symptoms: no bladder incontinence, no bowel incontinence, no fever, no leg pain, no paresis, no tingling and no weakness     Past Medical History  Diagnosis Date  . Hypertension   . Depression 01/2013   Past Surgical History  Procedure Laterality Date  . No past surgeries     Family History  Problem Relation Age of Onset  . Hypertension Maternal Grandmother   . Hypertension Maternal Grandfather   . Anesthesia problems Neg Hx   . Hypotension Neg Hx   . Malignant hyperthermia Neg Hx   . Pseudochol deficiency Neg Hx    History  Substance Use Topics  . Smoking status: Never Smoker   . Smokeless tobacco: Never Used  . Alcohol Use: No   OB History    Gravida Para Term Preterm AB TAB SAB Ectopic Multiple Living   Review of Systems  Constitutional: Negative.  Negative for fever.  HENT: Negative.  Negative for ear pain and sore throat.   Gastrointestinal: Negative for bowel incontinence.  Genitourinary: Negative for bladder incontinence.  Musculoskeletal: Positive for neck pain.  Neurological: Negative for tingling  and weakness.  Psychiatric/Behavioral: Positive for dysphoric mood.    Allergies  Review of patient's allergies indicates no known allergies.  Home Medications   Prior to Admission medications   Medication Sig Start Date End Date Taking? Authorizing Provider  metaxalone (SKELAXIN) 800 MG tablet Take 1 tablet (800 mg total) by mouth 3 (three) times daily. As muscle relaxer 02/10/15   Linna Hoff, MD  methyldopa (ALDOMET) 250 MG tablet Take 1 tablet (250 mg total) by mouth 2 (two) times daily. 03/12/13   Charm Rings, NP  montelukast (SINGULAIR) 10 MG tablet Take 10 mg by mouth daily.    Historical Provider, MD  Prenatal Vit-Fe Fumarate-FA (PRENATAL MULTIVITAMIN) TABS Take 1 tablet by mouth daily. 03/12/13   Charm Rings, NP  risperiDONE (RISPERDAL) 0.5 MG tablet Take 1 tablet (0.5 mg total) by mouth 2 (two) times daily. 03/12/13   Charm Rings, NP   BP 156/104 mmHg  Pulse 75  Temp(Src) 98.2 F (36.8 C) (Oral)  Resp 16  SpO2 100% Physical Exam  Constitutional: She is oriented to person, place, and time. She appears well-developed and well-nourished. She appears distressed.  HENT:  Head: Normocephalic.  Right Ear: External ear normal.  Left Ear: External ear normal.  Mouth/Throat: Oropharynx is clear and moist.  Eyes: Pupils are equal, round, and reactive to light.  Neck: Trachea normal. Muscular tenderness present. Carotid bruit is not present. No rigidity. Decreased range of motion  present. No edema and no erythema present. No thyroid mass and no thyromegaly present.    Lymphadenopathy:    She has no cervical adenopathy.  Neurological: She is alert and oriented to person, place, and time. She has normal reflexes. No cranial nerve deficit.  Skin: Skin is warm and dry.  Nursing note and vitals reviewed.   ED Course  Procedures (including critical care time) Labs Review Labs Reviewed - No data to display  Imaging Review No results found.   MDM   1. Cervical strain,  initial encounter   2. Depression        Linna HoffJames D Valisha Heslin, MD 02/10/15 1323

## 2015-02-10 NOTE — ED Notes (Signed)
C/o right side neck pain onset this am Reports pain increases when she turns to the right Alert, no signs of acute distress.

## 2015-02-10 NOTE — Discharge Instructions (Signed)
Use heat and medicine for neck soreness as needed, [facility].

## 2017-05-29 ENCOUNTER — Ambulatory Visit (HOSPITAL_COMMUNITY)
Admission: EM | Admit: 2017-05-29 | Discharge: 2017-05-29 | Disposition: A | Discharge status: Home / Self Care | Payer: Self-pay | Attending: Family Medicine | Admitting: Family Medicine

## 2017-05-29 ENCOUNTER — Encounter (HOSPITAL_COMMUNITY): Payer: Self-pay | Admitting: Emergency Medicine

## 2017-05-29 DIAGNOSIS — R059 Cough, unspecified: Secondary | ICD-10-CM

## 2017-05-29 DIAGNOSIS — R05 Cough: Secondary | ICD-10-CM

## 2017-05-29 DIAGNOSIS — R062 Wheezing: Secondary | ICD-10-CM

## 2017-05-29 MED ORDER — AZITHROMYCIN 250 MG PO TABS: 250.0000 mg | ORAL_TABLET | Freq: Every day | ORAL | 0 refills | Status: DC

## 2017-05-29 MED ORDER — HYDROCODONE-HOMATROPINE 5-1.5 MG/5ML PO SYRP: 5.0000 mL | ORAL_SOLUTION | Freq: Four times a day (QID) | ORAL | 0 refills | Status: DC | PRN

## 2017-05-29 MED ORDER — ALBUTEROL SULFATE HFA 108 (90 BASE) MCG/ACT IN AERS: 2.0000 | INHALATION_SPRAY | RESPIRATORY_TRACT | 0 refills | Status: DC | PRN

## 2017-05-29 NOTE — ED Triage Notes (Signed)
PT reports cough, fever, chest discomfort with coughing, and wheezing for 8 days.

## 2017-05-29 NOTE — ED Provider Notes (Signed)
  [facility]   [id number] 05/29/17 Arrival Time: 1156  ASSESSMENT & PLAN:  1. Cough   2. Wheezing     Meds ordered this encounter  Medications  . azithromycin (ZITHROMAX) 250 MG tablet    Sig: Take 1 tablet (250 mg total) by mouth daily. Take first 2 tablets together, then 1 every day until finished.    Dispense:  6 tablet    Refill:  0  . HYDROcodone-homatropine (HYCODAN) 5-1.5 MG/5ML syrup    Sig: Take 5 mLs by mouth every 6 (six) hours as needed for cough.    Dispense:  90 mL    Refill:  0  . albuterol (PROVENTIL HFA;VENTOLIN HFA) 108 (90 Base) MCG/ACT inhaler    Sig: Inhale 2 puffs into the lungs every 4 (four) hours as needed for wheezing or shortness of breath.    Dispense:  1 Inhaler    Refill:  0   OTC as needed. Medication sedation precautions. F/U if not showing improvement within a few days, sooner if needed. Reviewed expectations re: course of current medical issues. Questions answered. Outlined signs and symptoms indicating need for more acute intervention. Patient verbalized understanding. After Visit Summary given.   SUBJECTIVE:  History from patient. Interpreter used.  Michelle Christian is a 31 y.o. female who presents with complaint of URI symptoms for at least 2 weeks. Fever initially but none in the past several days. Cough bothering her the most. Affecting sleep. Thinks she is wheezing. No SOB. No n/v. Normal PO intake. Mild sore throat along with nasal congestion.  ROS: As per HPI.   OBJECTIVE:  Vitals:   05/29/17 1216 05/29/17 1221 05/29/17 1224 05/29/17 1224  BP:    (!) 147/102  Pulse: 86     Resp: 16     Temp:   98.6 F (37 C)   TempSrc:   Oral   SpO2: 97%     Weight:  193 lb (87.5 kg)       General appearance: alert; no distress HEENT: nasal congestion; mild throat irritation Neck: supple Lungs: overall clear and moving air well but some wheezing with coughing episodes Heart: regular rate and rhythm Skin: warm and dryl    Psychological:  alert and cooperative; normal mood and affect  No Known Allergies  PMHx, SurgHx, SocialHx, Medications, and Allergies were reviewed in the Visit Navigator and updated as appropriate.      Mardella LaymanHagler, Austin Herd, MD 05/29/17 1253

## 2017-05-29 NOTE — ED Notes (Signed)
[employer] video translator [phone] used for triage.

## 2017-10-28 NOTE — L&D Delivery Note (Addendum)
Patient is 32 y.o. [id number] admitted for IOL for cHTN. S/p IOL with foley bulb, cytotec, followed by Pitocin.  Prenatal course also complicated by diabetes mellitus, anxiety.  Delivery Note At 10:09 PM a viable female was delivered via Vaginal, Spontaneous (Presentation:ROA ; cephalic  ).  APGAR: 9, 9; weight pending .   Placenta status: intact, . 3 vessel Cord.  Head delivered ROA. No nuchal cord present.  The shoulders were not quite forthcoming, so the posterior (right) axilla was grasped with my index finger, and the baby was rotated clockwise into the oblique diameter.  At this point, the (now) anterior shoulder was released, and the baby delivered.  At no time was any traction placed on the baby's head. . Infant with spontaneous cry, placed on mother's abdomen, dried and bulb suctioned.  After 1 minute, the cord was clamped and cut. 40 units of pitocin diluted in 1000cc LR was infused rapidly IV.  The placenta separated spontaneously and delivered via CCT and maternal pushing effort.  It was inspected and appears to be intact with a 3 VC.   Fundus firm with massage and Pitocin. Perineum inspected and found to have no laceration.   Anesthesia:  none Episiotomy: None Lacerations: None Suture Repair: n/a Est. Blood Loss (mL): 200  Mom to postpartum.  Baby to Couplet care / Skin to Skin.  Sandre KittyDaniel K Olson 06/04/2018, 10:25 PM    The above was performed under my direct supervision and guidance.

## 2017-11-01 ENCOUNTER — Encounter (HOSPITAL_COMMUNITY): Payer: Self-pay

## 2017-11-01 ENCOUNTER — Inpatient Hospital Stay (HOSPITAL_COMMUNITY)
Admission: AD | Admit: 2017-11-01 | Discharge: 2017-11-01 | Disposition: A | Discharge status: Home / Self Care | Payer: Self-pay | Source: Ambulatory Visit | Attending: Obstetrics and Gynecology | Admitting: Obstetrics and Gynecology

## 2017-11-01 DIAGNOSIS — Z79899 Other long term (current) drug therapy: Secondary | ICD-10-CM | POA: Insufficient documentation

## 2017-11-01 DIAGNOSIS — O99341 Other mental disorders complicating pregnancy, first trimester: Secondary | ICD-10-CM | POA: Insufficient documentation

## 2017-11-01 DIAGNOSIS — J302 Other seasonal allergic rhinitis: Secondary | ICD-10-CM | POA: Insufficient documentation

## 2017-11-01 DIAGNOSIS — O26891 Other specified pregnancy related conditions, first trimester: Secondary | ICD-10-CM | POA: Insufficient documentation

## 2017-11-01 DIAGNOSIS — J3089 Other allergic rhinitis: Secondary | ICD-10-CM

## 2017-11-01 DIAGNOSIS — J029 Acute pharyngitis, unspecified: Secondary | ICD-10-CM | POA: Insufficient documentation

## 2017-11-01 DIAGNOSIS — F329 Major depressive disorder, single episode, unspecified: Secondary | ICD-10-CM | POA: Insufficient documentation

## 2017-11-01 DIAGNOSIS — Z3A08 8 weeks gestation of pregnancy: Secondary | ICD-10-CM | POA: Insufficient documentation

## 2017-11-01 DIAGNOSIS — O99511 Diseases of the respiratory system complicating pregnancy, first trimester: Secondary | ICD-10-CM | POA: Insufficient documentation

## 2017-11-01 DIAGNOSIS — O161 Unspecified maternal hypertension, first trimester: Secondary | ICD-10-CM | POA: Insufficient documentation

## 2017-11-01 DIAGNOSIS — J069 Acute upper respiratory infection, unspecified: Secondary | ICD-10-CM

## 2017-11-01 LAB — URINALYSIS, ROUTINE W REFLEX MICROSCOPIC
Bilirubin Urine: NEGATIVE
GLUCOSE, UA: NEGATIVE mg/dL
HGB URINE DIPSTICK: NEGATIVE
KETONES UR: NEGATIVE mg/dL
NITRITE: NEGATIVE
Protein, ur: NEGATIVE mg/dL
Specific Gravity, Urine: 1.025 (ref 1.005–1.030)
pH: 5 (ref 5.0–8.0)

## 2017-11-01 LAB — RAPID STREP SCREEN (MED CTR MEBANE ONLY): Streptococcus, Group A Screen (Direct): NEGATIVE

## 2017-11-01 LAB — POCT PREGNANCY, URINE: Preg Test, Ur: POSITIVE — AB

## 2017-11-01 NOTE — Discharge Instructions (Signed)
Infección del tracto respiratorio superior, adultos  (Upper Respiratory Infection, Adult)  La mayoría de las infecciones del tracto respiratorio superior están causadas por un virus. Un infección del tracto respiratorio superior afecta la nariz, la garganta y las vías respiratorias superiores. El tipo más común de infección del tracto respiratorio superior es el resfrío común.  CUIDADOS EN EL HOGAR  · Tome los medicamentos solamente como se lo haya indicado el médico.  · A fin de aliviar el dolor de garganta, haga gárgaras con solución salina templada o consuma caramelos para la tos, como se lo haya indicado el médico.  · Use un humidificador de vapor cálido o inhale el vapor de la ducha para aumentar la humedad del aire. Esto facilitará la respiración.  · Beba suficiente líquido para mantener el pis (orina) claro o de color amarillo pálido.  · Tome sopas y caldos transparentes.  · Siga una dieta saludable.  · Descanse todo lo que sea necesario.  · Regrese al trabajo cuando la fiebre haya desaparecido o el médico le diga que puede hacerlo.  ? Es posible que deba quedarse en su casa durante un tiempo prolongado, para no transmitir la infección a los demás.  ? También puede usar un barbijo y lavarse las manos con frecuencia para evitar el contagio del virus.  · Si tiene asma, use el inhalador con mayor frecuencia.  · No consuma ningún producto que contenga tabaco, lo que incluye cigarrillos, tabaco de mascar o cigarrillos electrónicos. Si necesita ayuda para dejar de fumar, consulte al médico.  SOLICITE AYUDA SI:  · Siente que empeora o que no mejora.  · Los medicamentos no logran aliviar los síntomas.  · Tiene escalofríos.  · La dificultad para respirar es peor.  · Tiene mucosidad marrón o roja.  · Tiene una secreción amarilla o marrón de la nariz.  · Le duele la cara, especialmente al inclinarse hacia adelante.  · Tiene fiebre.  · Tiene los ganglios del cuello hinchados.  · Siente dolor al tragar.   · Tiene zonas blancas en la parte de atrás de la garganta.  SOLICITE AYUDA DE INMEDIATO SI:  · Los siguientes síntomas son muy intensos o constantes:  ? Dolor de cabeza.  ? Dolor de oídos.  ? Dolor en la frente, detrás de los ojos y por encima de los pómulos (dolor sinusal).  ? Dolor en el pecho.  · Tiene enfermedad pulmonar prolongada (crónica) y cualquiera de estos síntomas:  ? Sibilancias.  ? Tos prolongada.  ? Tos con sangre.  ? Cambio en la mucosidad habitual.  · Presenta rigidez en el cuello.  · Tiene cambios en:  ? La visión.  ? La audición.  ? El pensamiento.  ? El estado de ánimo.  ASEGÚRESE DE QUE:  · Comprende estas instrucciones.  · Controlará su afección.  · Recibirá ayuda de inmediato si no mejora o si empeora.  Esta información no tiene como fin reemplazar el consejo del médico. Asegúrese de hacerle al médico cualquier pregunta que tenga.  Document Released: 03/18/2011 Document Revised: 02/28/2015 Document Reviewed: 01/19/2014  Elsevier Interactive Patient Education © 2018 Elsevier Inc.

## 2017-11-01 NOTE — MAU Note (Addendum)
Cough, fever, and congestion for 1 week. Also reports sore throat. Did not get flu shot. Has been exposed to URI. Has not tried any medicine at home.   Reports no pregnancy symptoms/complications.  When she cleans her ear with Q-Tip she sees some blood

## 2017-11-01 NOTE — MAU Provider Note (Signed)
Chief Complaint: Cough; Nasal Congestion; and Sore Throat   SUBJECTIVE HPI: Michelle Christian is a 32 y.o. G3P2002 at [redacted]w[redacted]d with PMH significant for allergies, cHTN, who presents to MAU with upper respiratory tract symptoms. Patient states she has been having cough, congestion, sore throat, low-grade fever. She has only been taking Tylenol. Her husband was sick with similar symptoms last week. Patient did not receive flu vaccine this year.   Mentions that she had a throat infection a couple months ago and was given antibiotics. Has a chronic cough for which she was given medication but states it is not working.   Denies vaginal bleeding, abdominal pain, n/v/d, vaginal discharge.  Of note, she cleaned her ears today and saw a small spot of blood after cleaning both ears. No pain in ears or discharge.   Past Medical History:  Diagnosis Date  . Depression 01/2013  . Hypertension    OB History  Gravida Para Term Preterm AB Living  3 2 2     2   SAB TAB Ectopic Multiple Live Births          2    # Outcome Date GA Lbr Len/2nd Weight Sex Delivery Anes PTL Lv  3 Current           2 Term 01/12/12 [redacted]w[redacted]d 11:13 / 00:21 2.965 kg (6 lb 8.6 oz) F Vag-Spont None  LIV     Birth Comments: No anomalies noted  1 Term 05/14/04 [redacted]w[redacted]d 06:00 3.175 kg (7 lb) M Vag-Spont None  LIV     Past Surgical History:  Procedure Laterality Date  . NO PAST SURGERIES     Social History   Socioeconomic History  . Marital status: Single    Spouse name: Not on file  . Number of children: Not on file  . Years of education: Not on file  . Highest education level: Not on file  Social Needs  . Financial resource strain: Not on file  . Food insecurity - worry: Not on file  . Food insecurity - inability: Not on file  . Transportation needs - medical: Not on file  . Transportation needs - non-medical: Not on file  Occupational History  . Not on file  Tobacco Use  . Smoking status: Never Smoker  . Smokeless tobacco:  Never Used  Substance and Sexual Activity  . Alcohol use: No  . Drug use: No  . Sexual activity: Yes    Birth control/protection: None  Other Topics Concern  . Not on file  Social History Narrative  . Not on file   No current facility-administered medications on file prior to encounter.    Current Outpatient Medications on File Prior to Encounter  Medication Sig Dispense Refill  . albuterol (PROVENTIL HFA;VENTOLIN HFA) 108 (90 Base) MCG/ACT inhaler Inhale 2 puffs into the lungs every 4 (four) hours as needed for wheezing or shortness of breath. 1 Inhaler 0  . azithromycin (ZITHROMAX) 250 MG tablet Take 1 tablet (250 mg total) by mouth daily. Take first 2 tablets together, then 1 every day until finished. 6 tablet 0  . busPIRone (BUSPAR) 10 MG tablet Take 10 mg by mouth 2 (two) times daily.    Marland Kitchen HYDROcodone-homatropine (HYCODAN) 5-1.5 MG/5ML syrup Take 5 mLs by mouth every 6 (six) hours as needed for cough. 90 mL 0  . metaxalone (SKELAXIN) 800 MG tablet Take 1 tablet (800 mg total) by mouth 3 (three) times daily. As muscle relaxer 21 tablet 1  . methyldopa (ALDOMET) 250  MG tablet Take 1 tablet (250 mg total) by mouth 2 (two) times daily. 60 tablet 0  . montelukast (SINGULAIR) 10 MG tablet Take 10 mg by mouth daily.    . Prenatal Vit-Fe Fumarate-FA (PRENATAL MULTIVITAMIN) TABS Take 1 tablet by mouth daily. 30 tablet 0  . propranolol (INDERAL) 20 MG tablet Take 20 mg by mouth daily.    . risperiDONE (RISPERDAL) 0.5 MG tablet Take 1 tablet (0.5 mg total) by mouth 2 (two) times daily. 60 tablet 0   No Known Allergies  I have reviewed the past Medical Hx, Surgical Hx, Social Hx, Allergies and Medications.   REVIEW OF SYSTEMS All systems reviewed and are negative for acute change except as noted in the HPI.   OBJECTIVE BP (!) 142/91   Pulse (!) 101   Temp 98 F (36.7 C) (Oral)   Resp 18   Ht 5\' 3"  (1.6 m)   Wt 85.2 kg (187 lb 12 oz)   LMP 09/04/2017   BMI 33.26 kg/m     PHYSICAL EXAM Constitutional: Well-developed, well-nourished female in no acute distress.  HEENT: NCAT. PERRL. Nares patent with swollen turbinates bialterally. O/P clear. MMM. Left cervical adenopathy. Ears:  Otoscopic examination reveals clear canals, tympanic membranes are intact bilaterally without bulging, retraction, inflammation or discharge. Cardiovascular: normal rate and rhythm, pulses intact Respiratory: normal rate and effort.  GI: Abd soft, non-tender, non-distended. Pos BS x 4 MS: Extremities nontender, no edema, normal ROM Neurologic: Alert and oriented x 4. No focal deficits Psych: normal mood and affect  LAB RESULTS Results for orders placed or performed during the hospital encounter of 11/01/17 (from the past 24 hour(s))  Urinalysis, Routine w reflex microscopic     Status: Abnormal   Collection Time: 11/01/17  1:45 PM  Result Value Ref Range   Color, Urine YELLOW YELLOW   APPearance CLOUDY (A) CLEAR   Specific Gravity, Urine 1.025 1.005 - 1.030   pH 5.0 5.0 - 8.0   Glucose, UA NEGATIVE NEGATIVE mg/dL   Hgb urine dipstick NEGATIVE NEGATIVE   Bilirubin Urine NEGATIVE NEGATIVE   Ketones, ur NEGATIVE NEGATIVE mg/dL   Protein, ur NEGATIVE NEGATIVE mg/dL   Nitrite NEGATIVE NEGATIVE   Leukocytes, UA SMALL (A) NEGATIVE   RBC / HPF 0-5 0 - 5 RBC/hpf   WBC, UA 6-30 0 - 5 WBC/hpf   Bacteria, UA FEW (A) NONE SEEN   Squamous Epithelial / LPF TOO NUMEROUS TO COUNT (A) NONE SEEN   Mucus PRESENT   Pregnancy, urine POC     Status: Abnormal   Collection Time: 11/01/17  2:11 PM  Result Value Ref Range   Preg Test, Ur POSITIVE (A) NEGATIVE  Rapid Strep Screen (Not at [facility])     Status: None   Collection Time: 11/01/17  2:21 PM  Result Value Ref Range   Streptococcus, Group A Screen (Direct) NEGATIVE NEGATIVE    IMAGING No results found.  MAU COURSE Vitals and nursing notes reviewed I have ordered labs and reviewed them -rapid strep negative Treatments given in MAU:  None  MDM Plan of care reviewed with patient, including labs and tests ordered and medical treatment.   ASSESSMENT 1. URI with cough and congestion   2. Sore throat   3. Environmental and seasonal allergies     PLAN Discharge home in stable condition. Counseled on return precautions Handout given Medications safe in pregnancy to treat symptoms given   Caryl AdaJazma Zlata Alcaide, DO OB Fellow Faculty Practice, [facility] 11/01/2017, 2:05 PM

## 2017-11-04 LAB — CULTURE, GROUP A STREP (THRC)

## 2017-11-13 ENCOUNTER — Other Ambulatory Visit: Payer: Self-pay | Admitting: Obstetrics & Gynecology

## 2017-11-13 DIAGNOSIS — Z369 Encounter for antenatal screening, unspecified: Secondary | ICD-10-CM

## 2017-11-13 LAB — OB RESULTS CONSOLE HEPATITIS B SURFACE ANTIGEN: Hepatitis B Surface Ag: NEGATIVE

## 2017-11-13 LAB — OB RESULTS CONSOLE HGB/HCT, BLOOD
HCT: 35
Hemoglobin: 11.2

## 2017-11-13 LAB — OB RESULTS CONSOLE PLATELET COUNT: Platelets: 204

## 2017-11-13 LAB — OB RESULTS CONSOLE ABO/RH: RH TYPE: POSITIVE

## 2017-11-13 LAB — OB RESULTS CONSOLE HIV ANTIBODY (ROUTINE TESTING): HIV: NONREACTIVE

## 2017-11-13 LAB — CYTOLOGY - PAP
Cystic Fibrosis Profile: NEGATIVE
DRUG SCREEN, URINE: NEGATIVE
GLUCOSE 1 HOUR: 158
Pap: NEGATIVE
URINE CULTURE, OB: NEGATIVE

## 2017-11-13 LAB — OB RESULTS CONSOLE ANTIBODY SCREEN: ANTIBODY SCREEN: NEGATIVE

## 2017-11-13 LAB — OB RESULTS CONSOLE RUBELLA ANTIBODY, IGM: RUBELLA: IMMUNE

## 2017-11-13 LAB — OB RESULTS CONSOLE GC/CHLAMYDIA
CHLAMYDIA, DNA PROBE: NEGATIVE
Gonorrhea: NEGATIVE

## 2017-11-13 LAB — OB RESULTS CONSOLE RPR: RPR: NONREACTIVE

## 2017-11-20 LAB — GLUCOSE, 3 HOUR GESTATIONAL

## 2017-11-25 ENCOUNTER — Other Ambulatory Visit: Payer: Self-pay

## 2017-11-26 ENCOUNTER — Encounter (HOSPITAL_COMMUNITY): Payer: Self-pay | Admitting: Obstetrics & Gynecology

## 2017-11-26 ENCOUNTER — Encounter: Payer: Self-pay | Admitting: *Deleted

## 2017-11-27 ENCOUNTER — Ambulatory Visit: Payer: Self-pay | Admitting: *Deleted

## 2017-11-27 ENCOUNTER — Encounter: Payer: Self-pay | Admitting: *Deleted

## 2017-11-27 ENCOUNTER — Encounter: Payer: Medicaid Other | Attending: Family Medicine | Admitting: *Deleted

## 2017-11-27 DIAGNOSIS — O0990 Supervision of high risk pregnancy, unspecified, unspecified trimester: Secondary | ICD-10-CM

## 2017-11-27 DIAGNOSIS — O24419 Gestational diabetes mellitus in pregnancy, unspecified control: Secondary | ICD-10-CM | POA: Diagnosis not present

## 2017-11-27 DIAGNOSIS — Z8659 Personal history of other mental and behavioral disorders: Secondary | ICD-10-CM

## 2017-11-27 DIAGNOSIS — Z713 Dietary counseling and surveillance: Secondary | ICD-10-CM | POA: Diagnosis present

## 2017-11-27 DIAGNOSIS — R7309 Other abnormal glucose: Secondary | ICD-10-CM

## 2017-11-27 DIAGNOSIS — O0993 Supervision of high risk pregnancy, unspecified, third trimester: Secondary | ICD-10-CM | POA: Insufficient documentation

## 2017-11-27 DIAGNOSIS — O9989 Other specified diseases and conditions complicating pregnancy, childbirth and the puerperium: Secondary | ICD-10-CM

## 2017-11-27 DIAGNOSIS — O10919 Unspecified pre-existing hypertension complicating pregnancy, unspecified trimester: Secondary | ICD-10-CM | POA: Insufficient documentation

## 2017-11-27 DIAGNOSIS — O99891 Other specified diseases and conditions complicating pregnancy: Secondary | ICD-10-CM | POA: Insufficient documentation

## 2017-11-27 NOTE — Progress Notes (Signed)
  Patient was seen on 11/27/2017 for Gestational Diabetes self-management . Patient here with husband, who states he has no questions. Spanish interpretor here. EDD 06/11/2018. Patient states no history of GDM or Diabetes. She [facility] The following learning objectives were met by the patient :   States the definition of Gestational Diabetes  States why dietary management is important in controlling blood glucose  Describes the effects of carbohydrates on blood glucose levels  Demonstrates ability to create a balanced meal plan  Demonstrates carbohydrate counting   States when to check blood glucose levels  Demonstrates proper blood glucose monitoring techniques  States the effect of stress and exercise on blood glucose levels  States the importance of limiting caffeine and abstaining from alcohol and smoking  Plan:  Aim for 3 Carb Choices per meal (45 grams) +/- 1 either way  Aim for 1-2 Carbs per snack Begin reading food labels for Total Carbohydrate of foods Consider  increasing your activity level by walking or other activity daily as tolerated Begin checking BG before breakfast and 2 hours after first bite of breakfast, lunch and dinner as directed by MD  Bring Log Book to every medical appointment   Take medication if directed by MD  Blood glucose monitor given: True Track Lot # [id number] Exp: 02/14/2049 Blood glucose reading: 87 mg/dl fasting  Patient has applied for Medicaid, I instructed her about Accu Chek Guide meter that can be Rx'd once she has Medicaid.   Patient instructed to monitor glucose levels: FBS: 60 - 95 mg/dl 2 hour: <120 mg/dl  Patient received the following handouts: Spanish  Nutrition Diabetes and Pregnancy  Carbohydrate Counting List  Patient will be seen for follow-up as needed.

## 2017-12-02 ENCOUNTER — Encounter: Payer: Self-pay | Admitting: Obstetrics & Gynecology

## 2017-12-04 ENCOUNTER — Encounter (HOSPITAL_COMMUNITY): Payer: Self-pay

## 2017-12-04 ENCOUNTER — Ambulatory Visit (HOSPITAL_COMMUNITY)
Admission: RE | Admit: 2017-12-04 | Discharge: 2017-12-04 | Disposition: A | Discharge status: Home / Self Care | Payer: Medicaid Other | Source: Ambulatory Visit | Attending: Obstetrics & Gynecology | Admitting: Obstetrics & Gynecology

## 2017-12-04 DIAGNOSIS — Z3682 Encounter for antenatal screening for nuchal translucency: Secondary | ICD-10-CM | POA: Insufficient documentation

## 2017-12-04 DIAGNOSIS — O0990 Supervision of high risk pregnancy, unspecified, unspecified trimester: Secondary | ICD-10-CM

## 2017-12-04 DIAGNOSIS — Z369 Encounter for antenatal screening, unspecified: Secondary | ICD-10-CM

## 2017-12-04 DIAGNOSIS — Z3A13 13 weeks gestation of pregnancy: Secondary | ICD-10-CM | POA: Insufficient documentation

## 2017-12-04 DIAGNOSIS — O10011 Pre-existing essential hypertension complicating pregnancy, first trimester: Secondary | ICD-10-CM | POA: Diagnosis not present

## 2017-12-04 DIAGNOSIS — O09891 Supervision of other high risk pregnancies, first trimester: Secondary | ICD-10-CM | POA: Diagnosis not present

## 2017-12-04 DIAGNOSIS — O10919 Unspecified pre-existing hypertension complicating pregnancy, unspecified trimester: Secondary | ICD-10-CM

## 2017-12-08 ENCOUNTER — Other Ambulatory Visit: Payer: Self-pay

## 2017-12-11 ENCOUNTER — Ambulatory Visit: Payer: Self-pay | Admitting: *Deleted

## 2017-12-11 ENCOUNTER — Other Ambulatory Visit: Payer: Self-pay

## 2017-12-11 ENCOUNTER — Encounter: Payer: Medicaid Other | Attending: Family Medicine | Admitting: *Deleted

## 2017-12-11 DIAGNOSIS — Z713 Dietary counseling and surveillance: Secondary | ICD-10-CM | POA: Insufficient documentation

## 2017-12-11 DIAGNOSIS — O24111 Pre-existing diabetes mellitus, type 2, in pregnancy, first trimester: Secondary | ICD-10-CM

## 2017-12-11 DIAGNOSIS — O24419 Gestational diabetes mellitus in pregnancy, unspecified control: Secondary | ICD-10-CM | POA: Diagnosis not present

## 2017-12-11 MED ORDER — ACCU-CHEK FASTCLIX LANCETS MISC
1.0000 | Freq: Four times a day (QID) | 12 refills | Status: DC
Start: 2017-12-11 — End: 2018-06-06

## 2017-12-11 MED ORDER — GLUCOSE BLOOD VI STRP: ORAL_STRIP | 12 refills | Status: DC

## 2017-12-11 MED ORDER — ACCU-CHEK AVIVA PLUS W/DEVICE KIT: 1.0000 | PACK | Freq: Four times a day (QID) | 0 refills | Status: DC

## 2017-12-11 NOTE — Progress Notes (Signed)
Patient here for follow up visit for Diabetes pre-pregnancy. Spanish interpreter used through [employer]. Her husband also attended the visit and he speaks some [place].   She brought her BG Log book which I reviewed. All BG's, fasting and post meal are within the target ranges! She has the following questions:  1) She has gotten several Error messages and not been able to get a BG reading 2) She states she got her Medicaid coverage yesterday, so we will send in Rx for Accu Chek Guide meter today 3) I noticed there are no post supper BG numbers, so we discussed why today 4) she had a few food and beverage questions today  I reviewed the possible reasons for Error messages and explained that in the User Guide, which is also in Spanish, there would be information on reasons for Errors based on the Error message number listed.   We will call in Rx for Accu Chek Guide meter today so she can start using that when she picks it up from the [facility] [address] I instructed her on use of this meter and the Fast Clix Drum lancing device.  She states she doesn't eat breakfast usually, her first meal is mid day and dinner is around 6 PM. So I asked her to leave the post breakfast column blank, and put her post lunch in the middle column and post supper in the last column from now on. I also encouraged her to eat some breakfast due to long length of time from supper to mid day the next day.   I answered her food questions, discouraged the use of fruit juices or regular sodas and why, and provided an additional list of foods by food group in Spanish for her information.

## 2017-12-18 ENCOUNTER — Ambulatory Visit (INDEPENDENT_AMBULATORY_CARE_PROVIDER_SITE_OTHER): Payer: Medicaid Other | Admitting: Obstetrics & Gynecology

## 2017-12-18 ENCOUNTER — Other Ambulatory Visit: Payer: Self-pay

## 2017-12-18 ENCOUNTER — Encounter: Payer: Self-pay | Admitting: Obstetrics & Gynecology

## 2017-12-18 VITALS — BP 127/89 | HR 97 | Wt 181.1 lb

## 2017-12-18 DIAGNOSIS — O24912 Unspecified diabetes mellitus in pregnancy, second trimester: Secondary | ICD-10-CM

## 2017-12-18 DIAGNOSIS — O10919 Unspecified pre-existing hypertension complicating pregnancy, unspecified trimester: Secondary | ICD-10-CM

## 2017-12-18 DIAGNOSIS — O24919 Unspecified diabetes mellitus in pregnancy, unspecified trimester: Secondary | ICD-10-CM

## 2017-12-18 DIAGNOSIS — O0990 Supervision of high risk pregnancy, unspecified, unspecified trimester: Secondary | ICD-10-CM

## 2017-12-18 DIAGNOSIS — O0992 Supervision of high risk pregnancy, unspecified, second trimester: Secondary | ICD-10-CM

## 2017-12-18 DIAGNOSIS — O10912 Unspecified pre-existing hypertension complicating pregnancy, second trimester: Secondary | ICD-10-CM

## 2017-12-18 LAB — POCT URINALYSIS DIP (DEVICE)
Glucose, UA: NEGATIVE mg/dL
Hgb urine dipstick: NEGATIVE
Leukocytes, UA: NEGATIVE
NITRITE: NEGATIVE
PH: 6 (ref 5.0–8.0)
PROTEIN: NEGATIVE mg/dL
Specific Gravity, Urine: >=1.03 (ref 1.005–1.030)
Urobilinogen, UA: 0.2 mg/dL (ref 0.0–1.0)

## 2017-12-18 MED ORDER — ASPIRIN EC 81 MG PO TBEC: 81.0000 mg | DELAYED_RELEASE_TABLET | Freq: Every day | ORAL | 2 refills | Status: DC

## 2017-12-18 NOTE — Patient Instructions (Signed)
Segundo trimestre de embarazo (Second Trimester of Pregnancy) El segundo trimestre va desde la semana13 hasta la 28, desde el cuarto hasta el sexto mes, y suele ser el momento en el que mejor se siente. En general, las nuseas matutinas han disminuido o han desaparecido completamente. Tendr ms energa y podr aumentarle el apetito. El beb por nacer (feto) se desarrolla rpidamente. Hacia el final del sexto mes, el beb mide aproximadamente 9 pulgadas (23 cm) y pesa alrededor de 1 libras (700 g). Es probable que sienta al beb moverse (dar pataditas) entre las 18 y 20 semanas del embarazo. CUIDADOS EN EL HOGAR  No fume, no consuma hierbas ni beba alcohol. No tome frmacos que el mdico no haya autorizado.  No consuma ningn producto que contenga tabaco, lo que incluye cigarrillos, tabaco de mascar o cigarrillos electrnicos. Si necesita ayuda para dejar de fumar, consulte al mdico. Puede recibir asesoramiento u otro tipo de apoyo para dejar de fumar.  Tome los medicamentos solamente como se lo haya indicado el mdico. Algunos medicamentos son seguros para tomar durante el embarazo y otros no lo son.  Haga ejercicios solamente como se lo haya indicado el mdico. Interrumpa la actividad fsica si comienza a tener calambres.  Ingiera alimentos saludables de manera regular.  Use un sostn que le brinde buen soporte si sus mamas estn sensibles.  No se d baos de inmersin en agua caliente, baos turcos ni saunas.  Colquese el cinturn de seguridad cuando conduzca.  No coma carne cruda ni queso sin cocinar; evite el contacto con las bandejas sanitarias de los gatos y la tierra que estos animales usan.  Tome las vitaminas prenatales.  Tome entre 1500 y 2000mg de calcio diariamente comenzando en la semana20 del embarazo hasta el parto.  Pruebe tomar un medicamento que la ayude a defecar (un laxante suave) si el mdico lo autoriza. Consuma ms fibra, que se encuentra en las frutas y  verduras frescas y los cereales integrales. Beba suficiente lquido para mantener el pis (orina) claro o de color amarillo plido.  Dese baos de asiento con agua tibia para aliviar el dolor o las molestias causadas por las hemorroides. Use una crema para las hemorroides si el mdico la autoriza.  Si se le hinchan las venas (venas varicosas), use medias de descanso. Levante (eleve) los pies durante 15minutos, 3 o 4veces por da. Limite el consumo de sal en su dieta.  No levante objetos pesados, use zapatos de tacones bajos y sintese derecha.  Descanse con las piernas elevadas si tiene calambres o dolor de cintura.  Visite a su dentista si no lo ha hecho durante el embarazo. Use un cepillo de cerdas suaves para cepillarse los dientes. Psese el hilo dental con suavidad.  Puede seguir manteniendo relaciones sexuales, a menos que el mdico le indique lo contrario.  Concurra a los controles mdicos.  SOLICITE AYUDA SI:  Siente mareos.  Sufre calambres o presin leves en la parte baja del vientre (abdomen).  Sufre un dolor persistente en el abdomen.  Tiene malestar estomacal (nuseas), vmitos, o tiene deposiciones acuosas (diarrea).  Advierte un olor ftido que proviene de la vagina.  Siente dolor al orinar.  SOLICITE AYUDA DE INMEDIATO SI:  Tiene fiebre.  Tiene una prdida de lquido por la vagina.  Tiene sangrado o pequeas prdidas vaginales.  Siente dolor intenso o clicos en el abdomen.  Sube o baja de peso rpidamente.  Tiene dificultades para recuperar el aliento y siente dolor en el pecho.  Sbitamente se   le hinchan mucho el rostro, las manos, los tobillos, los pies o las piernas.  No ha sentido los movimientos del beb durante una hora.  Siente un dolor de cabeza intenso que no se alivia con medicamentos.  Su visin se modifica.  Esta informacin no tiene como fin reemplazar el consejo del mdico. Asegrese de hacerle al mdico cualquier pregunta que  tenga. Document Released: 06/16/2013 Document Revised: 11/04/2014 Document Reviewed: 12/15/2012 Elsevier Interactive Patient Education  2017 [employer]  

## 2017-12-18 NOTE — Progress Notes (Signed)
  Subjective:HTN and DM    Michelle Christian is a G3P2002 2448w0d being seen today for her first obstetrical visit.  Her obstetrical history is significant for HTN and DM. Patient does intend to breast feed. Pregnancy history fully reviewed.  Patient reports nausea.  Vitals:   12/18/17 0903  BP: 127/89  Pulse: 97  Weight: 181 lb 1.6 oz (82.1 kg)    HISTORY: OB History  Gravida Para Term Preterm AB Living  3 2 2     2   SAB TAB Ectopic Multiple Live Births          2    # Outcome Date GA Lbr Len/2nd Weight Sex Delivery Anes PTL Lv  3 Current           2 Term 01/12/12 5136w4d 11:13 / 00:21 6 lb 8.6 oz (2.965 kg) F Vag-Spont None  LIV     Birth Comments: No anomalies noted, Gestational Hypertension  1 Term 05/14/04 3032w0d 06:00 7 lb (3.175 kg) M Vag-Spont None  LIV     [facility]     Past Medical History:  Diagnosis Date  . Depression 01/2013  . Hemorrhoids during pregnancy   . History of postpartum depression, currently pregnant   . Hypertension    Past Surgical History:  Procedure Laterality Date  . NO PAST SURGERIES     Family History  Problem Relation Age of Onset  . Hypertension Maternal Grandmother   . Hypertension Maternal Grandfather   . Anesthesia problems Neg Hx   . Hypotension Neg Hx   . Malignant hyperthermia Neg Hx   . Pseudochol deficiency Neg Hx      Exam    Uterus:     Pelvic Exam:                                    Skin: normal coloration and turgor, no rashes    Neurologic: oriented, normal mood   Extremities: normal strength, tone, and muscle mass   HEENT PERRLA   Mouth/Teeth dental hygiene good   Neck supple   Cardiovascular: regular rate and rhythm   Respiratory:  appears well, vitals normal, no respiratory distress, acyanotic, normal RR   Abdomen: soft, non-tender; bowel sounds normal; no masses,  no organomegaly   Urinary:        Assessment:    Pregnancy: [id number] Patient Active Problem List   Diagnosis Date Noted  .  Diabetes mellitus affecting pregnancy, antepartum 12/18/2017  . Chronic hypertension during pregnancy, antepartum 11/27/2017  . Supervision of high risk pregnancy, antepartum 11/27/2017  . History of postpartum depression, currently pregnant   . Major depression, recurrent (HCC) 03/10/2013  . Anxiety state, unspecified 03/10/2013  . Abnormal LFTs (liver function tests) 10/02/2011  . Hypertension 09/13/2011        Plan:     Initial labs drawn. Prenatal vitamins. Problem list reviewed and updated. Genetic Screening discussed Quad Screen: ordered.  Ultrasound discussed; fetal survey: ordered.  Follow up in 4 weeks. 50% of 30 min visit spent on counseling and coordination of care.  BG most in range on diet   Scheryl DarterJames Ricco Dershem 12/18/2017

## 2017-12-20 LAB — COMPREHENSIVE METABOLIC PANEL
A/G RATIO: 1.7 (ref 1.2–2.2)
ALT: 87 IU/L — AB (ref 0–32)
AST: 49 IU/L — ABNORMAL HIGH (ref 0–40)
Albumin: 4.2 g/dL (ref 3.5–5.5)
Alkaline Phosphatase: 55 IU/L (ref 39–117)
BILIRUBIN TOTAL: 0.2 mg/dL (ref 0.0–1.2)
BUN/Creatinine Ratio: 11 (ref 9–23)
BUN: 6 mg/dL (ref 6–20)
CHLORIDE: 105 mmol/L (ref 96–106)
CO2: 20 mmol/L (ref 20–29)
Calcium: 9.1 mg/dL (ref 8.7–10.2)
Creatinine, Ser: 0.53 mg/dL — ABNORMAL LOW (ref 0.57–1.00)
GFR calc non Af Amer: 124 mL/min/{1.73_m2} (ref >59)
GFR, EST AFRICAN AMERICAN: 143 mL/min/{1.73_m2} (ref >59)
GLOBULIN, TOTAL: 2.5 g/dL (ref 1.5–4.5)
Glucose: 80 mg/dL (ref 65–99)
POTASSIUM: 4.2 mmol/L (ref 3.5–5.2)
SODIUM: 139 mmol/L (ref 134–144)
TOTAL PROTEIN: 6.7 g/dL (ref 6.0–8.5)

## 2017-12-20 LAB — AFP, SERUM, OPEN SPINA BIFIDA
AFP MOM: 0.93
AFP VALUE AFPOSL: 17.1 ng/mL
Gest. Age on Collection Date: 15 weeks
Maternal Age At EDD: 34.5 yr
OSBR Risk 1 IN: 4778
TEST RESULTS AFP: NEGATIVE
Weight: 181 [lb_av]

## 2017-12-20 LAB — PROTEIN / CREATININE RATIO, URINE
Creatinine, Urine: 83.5 mg/dL
PROTEIN/CREAT RATIO: 176 mg/g{creat} (ref 0–200)
Protein, Ur: 14.7 mg/dL

## 2018-01-15 ENCOUNTER — Ambulatory Visit (INDEPENDENT_AMBULATORY_CARE_PROVIDER_SITE_OTHER): Payer: Self-pay | Admitting: Family Medicine

## 2018-01-15 ENCOUNTER — Encounter (HOSPITAL_COMMUNITY): Payer: Self-pay

## 2018-01-15 ENCOUNTER — Other Ambulatory Visit (HOSPITAL_COMMUNITY): Payer: Self-pay | Admitting: *Deleted

## 2018-01-15 ENCOUNTER — Other Ambulatory Visit: Payer: Self-pay | Admitting: Obstetrics & Gynecology

## 2018-01-15 ENCOUNTER — Ambulatory Visit (HOSPITAL_COMMUNITY)
Admission: RE | Admit: 2018-01-15 | Discharge: 2018-01-15 | Disposition: A | Discharge status: Home / Self Care | Payer: Self-pay | Source: Ambulatory Visit | Attending: Obstetrics & Gynecology | Admitting: Obstetrics & Gynecology

## 2018-01-15 VITALS — BP 121/78 | HR 89 | Wt 183.9 lb

## 2018-01-15 DIAGNOSIS — O0992 Supervision of high risk pregnancy, unspecified, second trimester: Secondary | ICD-10-CM | POA: Insufficient documentation

## 2018-01-15 DIAGNOSIS — Z3689 Encounter for other specified antenatal screening: Secondary | ICD-10-CM

## 2018-01-15 DIAGNOSIS — Z3A19 19 weeks gestation of pregnancy: Secondary | ICD-10-CM | POA: Insufficient documentation

## 2018-01-15 DIAGNOSIS — O24112 Pre-existing diabetes mellitus, type 2, in pregnancy, second trimester: Secondary | ICD-10-CM

## 2018-01-15 DIAGNOSIS — O99212 Obesity complicating pregnancy, second trimester: Secondary | ICD-10-CM

## 2018-01-15 DIAGNOSIS — O10919 Unspecified pre-existing hypertension complicating pregnancy, unspecified trimester: Secondary | ICD-10-CM

## 2018-01-15 DIAGNOSIS — O0990 Supervision of high risk pregnancy, unspecified, unspecified trimester: Secondary | ICD-10-CM

## 2018-01-15 DIAGNOSIS — O10912 Unspecified pre-existing hypertension complicating pregnancy, second trimester: Secondary | ICD-10-CM | POA: Insufficient documentation

## 2018-01-15 DIAGNOSIS — Z23 Encounter for immunization: Secondary | ICD-10-CM

## 2018-01-15 DIAGNOSIS — O24919 Unspecified diabetes mellitus in pregnancy, unspecified trimester: Secondary | ICD-10-CM

## 2018-01-15 DIAGNOSIS — O24912 Unspecified diabetes mellitus in pregnancy, second trimester: Secondary | ICD-10-CM

## 2018-01-15 LAB — POCT URINALYSIS DIP (DEVICE)
BILIRUBIN URINE: NEGATIVE
Glucose, UA: NEGATIVE mg/dL
Hgb urine dipstick: NEGATIVE
KETONES UR: NEGATIVE mg/dL
LEUKOCYTES UA: NEGATIVE
Nitrite: NEGATIVE
PH: 6 (ref 5.0–8.0)
Protein, ur: NEGATIVE mg/dL
Specific Gravity, Urine: 1.01 (ref 1.005–1.030)
Urobilinogen, UA: 0.2 mg/dL (ref 0.0–1.0)

## 2018-01-15 MED ORDER — METHYLDOPA 250 MG PO TABS: 250.0000 mg | ORAL_TABLET | Freq: Two times a day (BID) | ORAL | 0 refills | Status: DC

## 2018-01-15 MED ORDER — METHYLDOPA 250 MG PO TABS
250.0000 mg | ORAL_TABLET | Freq: Two times a day (BID) | ORAL | 0 refills | Status: DC
Start: 2018-01-15 — End: 2018-01-15

## 2018-01-15 MED ORDER — PRENATAL 19 PO CHEW: 1.0000 | CHEWABLE_TABLET | Freq: Every day | ORAL | 2 refills | Status: AC

## 2018-01-15 MED ORDER — ASPIRIN EC 81 MG PO TBEC: 81.0000 mg | DELAYED_RELEASE_TABLET | Freq: Every day | ORAL | 2 refills | Status: DC

## 2018-01-15 NOTE — Progress Notes (Signed)
Interpreter Michelle Christian present for encounter. Pt needs Rx for PNV and refill for Aldomet.  Pt reports RLQ pelvic pain and occasional headaches - relieved by Tylenol.

## 2018-01-15 NOTE — Progress Notes (Signed)
    PRENATAL VISIT NOTE Spanish interpreter: Okey Regalarol used Subjective:  Michelle Christian is a 32 y.o. G3P2002 at 8757w0d being seen today for ongoing prenatal care.  She is currently monitored for the following issues for this high-risk pregnancy and has Hypertension; Abnormal LFTs (liver function tests); Major depression, recurrent (HCC); Anxiety state, unspecified; Chronic hypertension during pregnancy, antepartum; Supervision of high risk pregnancy, antepartum; History of postpartum depression, currently pregnant; and Diabetes mellitus affecting pregnancy, antepartum on their problem list.  Patient reports low abdominal pain.  Contractions: Not present. Vag. Bleeding: None.  Movement: Present. Denies leaking of fluid.   The following portions of the patient's history were reviewed and updated as appropriate: allergies, current medications, past family history, past medical history, past social history, past surgical history and problem list. Problem list updated.  Objective:   Vitals:   01/15/18 0844  BP: 121/78  Pulse: 89  Weight: 183 lb 14.4 oz (83.4 kg)    Fetal Status: Fetal Heart Rate (bpm): 143 Fundal Height: 20 cm Movement: Present     General:  Alert, oriented and cooperative. Patient is in no acute distress.  Skin: Skin is warm and dry. No rash noted.   Cardiovascular: Normal heart rate noted  Respiratory: Normal respiratory effort, no problems with respiration noted  Abdomen: Soft, gravid, appropriate for gestational age.  Pain/Pressure: Present     Pelvic: Cervical exam deferred        Extremities: Normal range of motion.  Edema: None  Mental Status:  Normal mood and affect. Normal behavior. Normal judgment and thought content.  FBS 73-92 2 hour pp 73-108 Assessment and Plan:  Pregnancy: G3P2002 at 1557w0d  1. Supervision of high risk pregnancy, antepartum Anatomy u/s today Likely round ligament pain - Prenatal Vit-Fe Fumarate-FA (PRENATAL 19) tablet; Chew 1 tablet by mouth  daily.  Dispense: 90 tablet; Refill: 2  2. Chronic hypertension during pregnancy, antepartum BP is well controlled on Aldomet. Continue this and ASA - aspirin EC 81 MG tablet; Take 1 tablet (81 mg total) by mouth daily.  Dispense: 100 tablet; Refill: 2 - methyldopa (ALDOMET) 250 MG tablet; Take 1 tablet (250 mg total) by mouth 2 (two) times daily.  Dispense: 60 tablet; Refill: 0  3. Diabetes mellitus affecting pregnancy, antepartum CBGs are WNL on no meds--check HgbA1C and if > 6.5 will need fetal ECHO scheduled. - Hemoglobin A1c  General obstetric precautions including but not limited to vaginal bleeding, contractions, leaking of fluid and fetal movement were reviewed in detail with the patient. Please refer to After Visit Summary for other counseling recommendations.  Return in 3 weeks (on 02/05/2018).   Reva Boresanya S Chaniya Genter, MD

## 2018-01-15 NOTE — Patient Instructions (Signed)
 Second Trimester of Pregnancy The second trimester is from week 14 through week 27 (months 4 through 6). The second trimester is often a time when you feel your best. Your body has adjusted to being pregnant, and you begin to feel better physically. Usually, morning sickness has lessened or quit completely, you may have more energy, and you may have an increase in appetite. The second trimester is also a time when the fetus is growing rapidly. At the end of the sixth month, the fetus is about 9 inches long and weighs about 1 pounds. You will likely begin to feel the baby move (quickening) between 16 and 20 weeks of pregnancy. Body changes during your second trimester Your body continues to go through many changes during your second trimester. The changes vary from woman to woman.  Your weight will continue to increase. You will notice your lower abdomen bulging out.  You may begin to get stretch marks on your hips, abdomen, and breasts.  You may develop headaches that can be relieved by medicines. The medicines should be approved by your health care provider.  You may urinate more often because the fetus is pressing on your bladder.  You may develop or continue to have heartburn as a result of your pregnancy.  You may develop constipation because certain hormones are causing the muscles that push waste through your intestines to slow down.  You may develop hemorrhoids or swollen, bulging veins (varicose veins).  You may have back pain. This is caused by: ? Weight gain. ? Pregnancy hormones that are relaxing the joints in your pelvis. ? A shift in weight and the muscles that support your balance.  Your breasts will continue to grow and they will continue to become tender.  Your gums may bleed and may be sensitive to brushing and flossing.  Dark spots or blotches (chloasma, mask of pregnancy) may develop on your face. This will likely fade after the baby is born.  A dark line from  your belly button to the pubic area (linea nigra) may appear. This will likely fade after the baby is born.  You may have changes in your hair. These can include thickening of your hair, rapid growth, and changes in texture. Some women also have hair loss during or after pregnancy, or hair that feels dry or thin. Your hair will most likely return to normal after your baby is born.  What to expect at prenatal visits During a routine prenatal visit:  You will be weighed to make sure you and the fetus are growing normally.  Your blood pressure will be taken.  Your abdomen will be measured to track your baby's growth.  The fetal heartbeat will be listened to.  Any test results from the previous visit will be discussed.  Your health care provider may ask you:  How you are feeling.  If you are feeling the baby move.  If you have had any abnormal symptoms, such as leaking fluid, bleeding, severe headaches, or abdominal cramping.  If you are using any tobacco products, including cigarettes, chewing tobacco, and electronic cigarettes.  If you have any questions.  Other tests that may be performed during your second trimester include:  Blood tests that check for: ? Low iron levels (anemia). ? High blood sugar that affects pregnant women (gestational diabetes) between 24 and 28 weeks. ? Rh antibodies. This is to check for a protein on red blood cells (Rh factor).  Urine tests to check for infections, diabetes,   or protein in the urine.  An ultrasound to confirm the proper growth and development of the baby.  An amniocentesis to check for possible genetic problems.  Fetal screens for spina bifida and Down syndrome.  HIV (human immunodeficiency virus) testing. Routine prenatal testing includes screening for HIV, unless you choose not to have this test.  Follow these instructions at home: Medicines  Follow your health care provider's instructions regarding medicine use. Specific  medicines may be either safe or unsafe to take during pregnancy.  Take a prenatal vitamin that contains at least 600 micrograms (mcg) of folic acid.  If you develop constipation, try taking a stool softener if your health care provider approves. Eating and drinking  Eat a balanced diet that includes fresh fruits and vegetables, whole grains, good sources of protein such as meat, eggs, or tofu, and low-fat dairy. Your health care provider will help you determine the amount of weight gain that is right for you.  Avoid raw meat and uncooked cheese. These carry germs that can cause birth defects in the baby.  If you have low calcium intake from food, talk to your health care provider about whether you should take a daily calcium supplement.  Limit foods that are high in fat and processed sugars, such as fried and sweet foods.  To prevent constipation: ? Drink enough fluid to keep your urine clear or pale yellow. ? Eat foods that are high in fiber, such as fresh fruits and vegetables, whole grains, and beans. Activity  Exercise only as directed by your health care provider. Most women can continue their usual exercise routine during pregnancy. Try to exercise for 30 minutes at least 5 days a week. Stop exercising if you experience uterine contractions.  Avoid heavy lifting, wear low heel shoes, and practice good posture.  A sexual relationship may be continued unless your health care provider directs you otherwise. Relieving pain and discomfort  Wear a good support bra to prevent discomfort from breast tenderness.  Take warm sitz baths to soothe any pain or discomfort caused by hemorrhoids. Use hemorrhoid cream if your health care provider approves.  Rest with your legs elevated if you have leg cramps or low back pain.  If you develop varicose veins, wear support hose. Elevate your feet for 15 minutes, 3-4 times a day. Limit salt in your diet. Prenatal Care  Write down your questions.  Take them to your prenatal visits.  Keep all your prenatal visits as told by your health care provider. This is important. Safety  Wear your seat belt at all times when driving.  Make a list of emergency phone numbers, including numbers for family, friends, the hospital, and police and fire departments. General instructions  Ask your health care provider for a referral to a local prenatal education class. Begin classes no later than the beginning of month 6 of your pregnancy.  Ask for help if you have counseling or nutritional needs during pregnancy. Your health care provider can offer advice or refer you to specialists for help with various needs.  Do not use hot tubs, steam rooms, or saunas.  Do not douche or use tampons or scented sanitary pads.  Do not cross your legs for long periods of time.  Avoid cat litter boxes and soil used by cats. These carry germs that can cause birth defects in the baby and possibly loss of the fetus by miscarriage or stillbirth.  Avoid all smoking, herbs, alcohol, and unprescribed drugs. Chemicals in these products   can affect the formation and growth of the baby.  Do not use any products that contain nicotine or tobacco, such as cigarettes and e-cigarettes. If you need help quitting, ask your health care provider.  Visit your dentist if you have not gone yet during your pregnancy. Use a soft toothbrush to brush your teeth and be gentle when you floss. Contact a health care provider if:  You have dizziness.  You have mild pelvic cramps, pelvic pressure, or nagging pain in the abdominal area.  You have persistent nausea, vomiting, or diarrhea.  You have a bad smelling vaginal discharge.  You have pain when you urinate. Get help right away if:  You have a fever.  You are leaking fluid from your vagina.  You have spotting or bleeding from your vagina.  You have severe abdominal cramping or pain.  You have rapid weight gain or weight  loss.  You have shortness of breath with chest pain.  You notice sudden or extreme swelling of your face, hands, ankles, feet, or legs.  You have not felt your baby move in over an hour.  You have severe headaches that do not go away when you take medicine.  You have vision changes. Summary  The second trimester is from week 14 through week 27 (months 4 through 6). It is also a time when the fetus is growing rapidly.  Your body goes through many changes during pregnancy. The changes vary from woman to woman.  Avoid all smoking, herbs, alcohol, and unprescribed drugs. These chemicals affect the formation and growth your baby.  Do not use any tobacco products, such as cigarettes, chewing tobacco, and e-cigarettes. If you need help quitting, ask your health care provider.  Contact your health care provider if you have any questions. Keep all prenatal visits as told by your health care provider. This is important. This information is not intended to replace advice given to you by your health care provider. Make sure you discuss any questions you have with your health care provider. Document Released: 10/08/2001 Document Revised: 11/19/2016 Document Reviewed: 11/19/2016 Else[employer] Interactive Patient Education  2018 [employer]   Breastfeeding Choosing to breastfeed is one of the best decisions you can make for yourself and your baby. A change in hormones during pregnancy causes your breasts to make breast milk in your milk-producing glands. Hormones prevent breast milk from being released before your baby is born. They also prompt milk flow after birth. Once breastfeeding has begun, thoughts of your baby, as well as his or her sucking or crying, can stimulate the release of milk from your milk-producing glands. Benefits of breastfeeding Research shows that breastfeeding offers many health benefits for infants and mothers. It also offers a cost-free and convenient way to feed your  baby. For your baby  Your first milk (colostrum) helps your baby's digestive system to function better.  Special cells in your milk (antibodies) help your baby to fight off infections.  Breastfed babies are less likely to develop asthma, allergies, obesity, or type 2 diabetes. They are also at lower risk for sudden infant death syndrome (SIDS).  Nutrients in breast milk are better able to meet your baby's needs compared to infant formula.  Breast milk improves your baby's brain development. For you  Breastfeeding helps to create a very special bond between you and your baby.  Breastfeeding is convenient. Breast milk costs nothing and is always available at the correct temperature.  Breastfeeding helps to burn calories. It helps you   to lose the weight that you gained during pregnancy.  Breastfeeding makes your uterus return faster to its size before pregnancy. It also slows bleeding (lochia) after you give birth.  Breastfeeding helps to lower your risk of developing type 2 diabetes, osteoporosis, rheumatoid arthritis, cardiovascular disease, and breast, ovarian, uterine, and endometrial cancer later in life. Breastfeeding basics Starting breastfeeding  Find a comfortable place to sit or lie down, with your neck and back well-supported.  Place a pillow or a rolled-up blanket under your baby to bring him or her to the level of your breast (if you are seated). Nursing pillows are specially designed to help support your arms and your baby while you breastfeed.  Make sure that your baby's tummy (abdomen) is facing your abdomen.  Gently massage your breast. With your fingertips, massage from the outer edges of your breast inward toward the nipple. This encourages milk flow. If your milk flows slowly, you may need to continue this action during the feeding.  Support your breast with 4 fingers underneath and your thumb above your nipple (make the letter "C" with your hand). Make sure your  fingers are well away from your nipple and your baby's mouth.  Stroke your baby's lips gently with your finger or nipple.  When your baby's mouth is open wide enough, quickly bring your baby to your breast, placing your entire nipple and as much of the areola as possible into your baby's mouth. The areola is the colored area around your nipple. ? More areola should be visible above your baby's upper lip than below the lower lip. ? Your baby's lips should be opened and extended outward (flanged) to ensure an adequate, comfortable latch. ? Your baby's tongue should be between his or her lower gum and your breast.  Make sure that your baby's mouth is correctly positioned around your nipple (latched). Your baby's lips should create a seal on your breast and be turned out (everted).  It is common for your baby to suck about 2-3 minutes in order to start the flow of breast milk. Latching Teaching your baby how to latch onto your breast properly is very important. An improper latch can cause nipple pain, decreased milk supply, and poor weight gain in your baby. Also, if your baby is not latched onto your nipple properly, he or she may swallow some air during feeding. This can make your baby fussy. Burping your baby when you switch breasts during the feeding can help to get rid of the air. However, teaching your baby to latch on properly is still the best way to prevent fussiness from swallowing air while breastfeeding. Signs that your baby has successfully latched onto your nipple  Silent tugging or silent sucking, without causing you pain. Infant's lips should be extended outward (flanged).  Swallowing heard between every 3-4 sucks once your milk has started to flow (after your let-down milk reflex occurs).  Muscle movement above and in front of his or her ears while sucking.  Signs that your baby has not successfully latched onto your nipple  Sucking sounds or smacking sounds from your baby while  breastfeeding.  Nipple pain.  If you think your baby has not latched on correctly, slip your finger into the corner of your baby's mouth to break the suction and place it between your baby's gums. Attempt to start breastfeeding again. Signs of successful breastfeeding Signs from your baby  Your baby will gradually decrease the number of sucks or will completely stop sucking.    Your baby will fall asleep.  Your baby's body will relax.  Your baby will retain a small amount of milk in his or her mouth.  Your baby will let go of your breast by himself or herself.  Signs from you  Breasts that have increased in firmness, weight, and size 1-3 hours after feeding.  Breasts that are softer immediately after breastfeeding.  Increased milk volume, as well as a change in milk consistency and color by the fifth day of breastfeeding.  Nipples that are not sore, cracked, or bleeding.  Signs that your baby is getting enough milk  Wetting at least 1-2 diapers during the first 24 hours after birth.  Wetting at least 5-6 diapers every 24 hours for the first week after birth. The urine should be clear or pale yellow by the age of 5 days.  Wetting 6-8 diapers every 24 hours as your baby continues to grow and develop.  At least 3 stools in a 24-hour period by the age of 5 days. The stool should be soft and yellow.  At least 3 stools in a 24-hour period by the age of 7 days. The stool should be seedy and yellow.  No loss of weight greater than 10% of birth weight during the first 3 days of life.  Average weight gain of 4-7 oz (113-198 g) per week after the age of 4 days.  Consistent daily weight gain by the age of 5 days, without weight loss after the age of 2 weeks. After a feeding, your baby may spit up a small amount of milk. This is normal. Breastfeeding frequency and duration Frequent feeding will help you make more milk and can prevent sore nipples and extremely full breasts (breast  engorgement). Breastfeed when you feel the need to reduce the fullness of your breasts or when your baby shows signs of hunger. This is called "breastfeeding on demand." Signs that your baby is hungry include:  Increased alertness, activity, or restlessness.  Movement of the head from side to side.  Opening of the mouth when the corner of the mouth or cheek is stroked (rooting).  Increased sucking sounds, smacking lips, cooing, sighing, or squeaking.  Hand-to-mouth movements and sucking on fingers or hands.  Fussing or crying.  Avoid introducing a pacifier to your baby in the first 4-6 weeks after your baby is born. After this time, you may choose to use a pacifier. Research has shown that pacifier use during the first year of a baby's life decreases the risk of sudden infant death syndrome (SIDS). Allow your baby to feed on each breast as long as he or she wants. When your baby unlatches or falls asleep while feeding from the first breast, offer the second breast. Because newborns are often sleepy in the first few weeks of life, you may need to awaken your baby to get him or her to feed. Breastfeeding times will vary from baby to baby. However, the following rules can serve as a guide to help you make sure that your baby is properly fed:  Newborns (babies 4 weeks of age or younger) may breastfeed every 1-3 hours.  Newborns should not go without breastfeeding for longer than 3 hours during the day or 5 hours during the night.  You should breastfeed your baby a minimum of 8 times in a 24-hour period.  Breast milk pumping Pumping and storing breast milk allows you to make sure that your baby is exclusively fed your breast milk, even at times   when you are unable to breastfeed. This is especially important if you go back to work while you are still breastfeeding, or if you are not able to be present during feedings. Your lactation consultant can help you find a method of pumping that works best  for you and give you guidelines about how long it is safe to store breast milk. Caring for your breasts while you breastfeed Nipples can become dry, cracked, and sore while breastfeeding. The following recommendations can help keep your breasts moisturized and healthy:  Avoid using soap on your nipples.  Wear a supportive bra designed especially for nursing. Avoid wearing underwire-style bras or extremely tight bras (sports bras).  Air-dry your nipples for 3-4 minutes after each feeding.  Use only cotton bra pads to absorb leaked breast milk. Leaking of breast milk between feedings is normal.  Use lanolin on your nipples after breastfeeding. Lanolin helps to maintain your skin's normal moisture barrier. Pure lanolin is not harmful (not toxic) to your baby. You may also hand express a few drops of breast milk and gently massage that milk into your nipples and allow the milk to air-dry.  In the first few weeks after giving birth, some women experience breast engorgement. Engorgement can make your breasts feel heavy, warm, and tender to the touch. Engorgement peaks within 3-5 days after you give birth. The following recommendations can help to ease engorgement:  Completely empty your breasts while breastfeeding or pumping. You may want to start by applying warm, moist heat (in the shower or with warm, water-soaked hand towels) just before feeding or pumping. This increases circulation and helps the milk flow. If your baby does not completely empty your breasts while breastfeeding, pump any extra milk after he or she is finished.  Apply ice packs to your breasts immediately after breastfeeding or pumping, unless this is too uncomfortable for you. To do this: ? Put ice in a plastic bag. ? Place a towel between your skin and the bag. ? Leave the ice on for 20 minutes, 2-3 times a day.  Make sure that your baby is latched on and positioned properly while breastfeeding.  If engorgement persists  after 48 hours of following these recommendations, contact your health care provider or a lactation consultant. Overall health care recommendations while breastfeeding  Eat 3 healthy meals and 3 snacks every day. Well-nourished mothers who are breastfeeding need an additional 450-500 calories a day. You can meet this requirement by increasing the amount of a balanced diet that you eat.  Drink enough water to keep your urine pale yellow or clear.  Rest often, relax, and continue to take your prenatal vitamins to prevent fatigue, stress, and low vitamin and mineral levels in your body (nutrient deficiencies).  Do not use any products that contain nicotine or tobacco, such as cigarettes and e-cigarettes. Your baby may be harmed by chemicals from cigarettes that pass into breast milk and exposure to secondhand smoke. If you need help quitting, ask your health care provider.  Avoid alcohol.  Do not use illegal drugs or marijuana.  Talk with your health care provider before taking any medicines. These include over-the-counter and prescription medicines as well as vitamins and herbal supplements. Some medicines that may be harmful to your baby can pass through breast milk.  It is possible to become pregnant while breastfeeding. If birth control is desired, ask your health care provider about options that will be safe while breastfeeding your baby. Where to find more information: [place]   [employer]: www.llli.org Contact a health care provider if:  You feel like you want to stop breastfeeding or have become frustrated with breastfeeding.  Your nipples are cracked or bleeding.  Your breasts are red, tender, or warm.  You have: ? Painful breasts or nipples. ? A swollen area on either breast. ? A fever or chills. ? Nausea or vomiting. ? Drainage other than breast milk from your nipples.  Your breasts do not become full before feedings by the fifth day after you give birth.  You  feel sad and depressed.  Your baby is: ? Too sleepy to eat well. ? Having trouble sleeping. ? More than 1 week old and wetting fewer than 6 diapers in a 24-hour period. ? Not gaining weight by 5 days of age.  Your baby has fewer than 3 stools in a 24-hour period.  Your baby's skin or the white parts of his or her eyes become yellow. Get help right away if:  Your baby is overly tired (lethargic) and does not want to wake up and feed.  Your baby develops an unexplained fever. Summary  Breastfeeding offers many health benefits for infant and mothers.  Try to breastfeed your infant when he or she shows early signs of hunger.  Gently tickle or stroke your baby's lips with your finger or nipple to allow the baby to open his or her mouth. Bring the baby to your breast. Make sure that much of the areola is in your baby's mouth. Offer one side and burp the baby before you offer the other side.  Talk with your health care provider or lactation consultant if you have questions or you face problems as you breastfeed. This information is not intended to replace advice given to you by your health care provider. Make sure you discuss any questions you have with your health care provider. Document Released: 10/14/2005 Document Revised: 11/15/2016 Document Reviewed: 11/15/2016 Elsevier Interactive Patient Education  2018 [employer]  

## 2018-01-16 LAB — HEMOGLOBIN A1C
ESTIMATED AVERAGE GLUCOSE: 103 mg/dL
Hgb A1c MFr Bld: 5.2 % (ref 4.8–5.6)

## 2018-01-22 ENCOUNTER — Other Ambulatory Visit: Payer: Self-pay | Admitting: *Deleted

## 2018-01-22 DIAGNOSIS — O10919 Unspecified pre-existing hypertension complicating pregnancy, unspecified trimester: Secondary | ICD-10-CM

## 2018-01-22 MED ORDER — METHYLDOPA 250 MG PO TABS: 250.0000 mg | ORAL_TABLET | Freq: Two times a day (BID) | ORAL | 0 refills | Status: DC

## 2018-01-27 ENCOUNTER — Telehealth: Payer: Self-pay | Admitting: General Practice

## 2018-01-27 NOTE — Telephone Encounter (Signed)
Patient called and left message on voicemail line requesting a call back. Called patient with [employer] interpreter # [phone]. Patient states she is running out of her aldomet and has had problems getting the medication from the pharmacy. Told patient I would contact the pharmacy today and correct the problem. Patient verbalized understanding & had no questions. Called [facility] pharmacy who states aldomet has been on backorder but they are supposed to get some in at 3pm today.

## 2018-01-28 MED FILL — METHYLDOPA 250 MG TABLET: 250 | 30 days supply | Qty: 60 | Fill #0

## 2018-02-05 ENCOUNTER — Ambulatory Visit (INDEPENDENT_AMBULATORY_CARE_PROVIDER_SITE_OTHER): Payer: Self-pay | Admitting: Obstetrics and Gynecology

## 2018-02-05 ENCOUNTER — Ambulatory Visit (INDEPENDENT_AMBULATORY_CARE_PROVIDER_SITE_OTHER): Payer: Self-pay | Admitting: Clinical

## 2018-02-05 ENCOUNTER — Encounter: Payer: Self-pay | Admitting: Obstetrics and Gynecology

## 2018-02-05 VITALS — BP 127/88 | HR 106 | Wt 187.8 lb

## 2018-02-05 DIAGNOSIS — F3340 Major depressive disorder, recurrent, in remission, unspecified: Secondary | ICD-10-CM

## 2018-02-05 DIAGNOSIS — O0990 Supervision of high risk pregnancy, unspecified, unspecified trimester: Secondary | ICD-10-CM

## 2018-02-05 DIAGNOSIS — Z789 Other specified health status: Secondary | ICD-10-CM | POA: Insufficient documentation

## 2018-02-05 DIAGNOSIS — Z758 Other problems related to medical facilities and other health care: Secondary | ICD-10-CM | POA: Insufficient documentation

## 2018-02-05 DIAGNOSIS — O24919 Unspecified diabetes mellitus in pregnancy, unspecified trimester: Secondary | ICD-10-CM

## 2018-02-05 DIAGNOSIS — F4322 Adjustment disorder with anxiety: Secondary | ICD-10-CM

## 2018-02-05 DIAGNOSIS — O10919 Unspecified pre-existing hypertension complicating pregnancy, unspecified trimester: Secondary | ICD-10-CM

## 2018-02-05 LAB — POCT URINALYSIS DIP (DEVICE)
Bilirubin Urine: NEGATIVE
GLUCOSE, UA: NEGATIVE mg/dL
Hgb urine dipstick: NEGATIVE
Ketones, ur: NEGATIVE mg/dL
Leukocytes, UA: NEGATIVE
NITRITE: NEGATIVE
PROTEIN: NEGATIVE mg/dL
SPECIFIC GRAVITY, URINE: 1.02 (ref 1.005–1.030)
UROBILINOGEN UA: 0.2 mg/dL (ref 0.0–1.0)
pH: 6 (ref 5.0–8.0)
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
## 2018-02-05 NOTE — Progress Notes (Signed)
Prenatal Visit Note Date: 02/05/2018 Clinic: [facility]  Subjective:  Michelle Christian is a 32 y.o. G3P2002 at 3142w0d being seen today for ongoing prenatal care.  She is currently monitored for the following issues for this high-risk pregnancy and has Hypertension; Major depression, recurrent (HCC); Anxiety state, unspecified; Chronic hypertension during pregnancy, antepartum; Supervision of high risk pregnancy, antepartum; History of postpartum depression, currently pregnant; and Diabetes mellitus affecting pregnancy, antepartum on their problem list.  Patient reports no complaints.   Contractions: Not present. Vag. Bleeding: None.  Movement: Present. Denies leaking of fluid.   The following portions of the patient's history were reviewed and updated as appropriate: allergies, current medications, past family history, past medical history, past social history, past surgical history and problem list. Problem list updated.  Objective:   Vitals:   02/05/18 1130  BP: 127/88  Pulse: (!) 106  Weight: 187 lb 12.8 oz (85.2 kg)    Fetal Status: Fetal Heart Rate (bpm): 142   Movement: Present     General:  Alert, oriented and cooperative. Patient is in no acute distress.  Skin: Skin is warm and dry. No rash noted.   Cardiovascular: Normal heart rate noted  Respiratory: Normal respiratory effort, no problems with respiration noted  Abdomen: Soft, gravid, appropriate for gestational age. Pain/Pressure: Absent     Pelvic:  Cervical exam deferred        Extremities: Normal range of motion.  Edema: None  Mental Status: Normal mood and affect. Normal behavior. Normal judgment and thought content.   Urinalysis:      Assessment and Plan:  Pregnancy: G3P2002 at 8142w0d  1. Chronic hypertension during pregnancy, antepartum Doing well on methyldopa 250 bid, low dose asa.  Has surevillance u/s already scheduled  2. Diabetes mellitus affecting pregnancy, antepartum Doing well on no  meds.   3. Supervision of high risk pregnancy, antepartum Routine care  4. Recurrent major depressive disorder, in remission [facility](HCC) Doing well on no meds. To see Michelle Christian today.   Interpreter used  Preterm labor symptoms and general obstetric precautions including but not limited to vaginal bleeding, contractions, leaking of fluid and fetal movement were reviewed in detail with the patient. Please refer to After Visit Summary for other counseling recommendations.  Return in about 2 weeks (around 02/19/2018) for hrob.   Mesick BingPickens, Michelle Bright, MD

## 2018-02-19 ENCOUNTER — Encounter: Payer: Self-pay | Admitting: *Deleted

## 2018-02-26 ENCOUNTER — Other Ambulatory Visit (HOSPITAL_COMMUNITY): Payer: Self-pay | Admitting: Maternal and Fetal Medicine

## 2018-02-26 ENCOUNTER — Ambulatory Visit (INDEPENDENT_AMBULATORY_CARE_PROVIDER_SITE_OTHER): Payer: Self-pay | Admitting: Obstetrics & Gynecology

## 2018-02-26 ENCOUNTER — Ambulatory Visit (HOSPITAL_COMMUNITY)
Admission: RE | Admit: 2018-02-26 | Discharge: 2018-02-26 | Disposition: A | Discharge status: Home / Self Care | Payer: Self-pay | Source: Ambulatory Visit | Attending: Obstetrics and Gynecology | Admitting: Obstetrics and Gynecology

## 2018-02-26 ENCOUNTER — Telehealth: Payer: Self-pay | Admitting: Obstetrics & Gynecology

## 2018-02-26 ENCOUNTER — Ambulatory Visit: Payer: Self-pay

## 2018-02-26 ENCOUNTER — Encounter (HOSPITAL_COMMUNITY): Payer: Self-pay

## 2018-02-26 VITALS — BP 115/88 | HR 81 | Wt 187.3 lb

## 2018-02-26 DIAGNOSIS — Z362 Encounter for other antenatal screening follow-up: Secondary | ICD-10-CM | POA: Insufficient documentation

## 2018-02-26 DIAGNOSIS — O10012 Pre-existing essential hypertension complicating pregnancy, second trimester: Secondary | ICD-10-CM | POA: Insufficient documentation

## 2018-02-26 DIAGNOSIS — Z3A25 25 weeks gestation of pregnancy: Secondary | ICD-10-CM | POA: Insufficient documentation

## 2018-02-26 DIAGNOSIS — O24112 Pre-existing diabetes mellitus, type 2, in pregnancy, second trimester: Secondary | ICD-10-CM | POA: Insufficient documentation

## 2018-02-26 DIAGNOSIS — O10919 Unspecified pre-existing hypertension complicating pregnancy, unspecified trimester: Secondary | ICD-10-CM

## 2018-02-26 DIAGNOSIS — O99212 Obesity complicating pregnancy, second trimester: Secondary | ICD-10-CM

## 2018-02-26 DIAGNOSIS — O0990 Supervision of high risk pregnancy, unspecified, unspecified trimester: Secondary | ICD-10-CM

## 2018-02-26 DIAGNOSIS — O24919 Unspecified diabetes mellitus in pregnancy, unspecified trimester: Secondary | ICD-10-CM

## 2018-02-26 LAB — POCT URINALYSIS DIP (DEVICE)
BILIRUBIN URINE: NEGATIVE
GLUCOSE, UA: NEGATIVE mg/dL
Hgb urine dipstick: NEGATIVE
Ketones, ur: NEGATIVE mg/dL
Leukocytes, UA: NEGATIVE
NITRITE: NEGATIVE
Protein, ur: NEGATIVE mg/dL
Specific Gravity, Urine: 1.015 (ref 1.005–1.030)
Urobilinogen, UA: 0.2 mg/dL (ref 0.0–1.0)
pH: 6.5 (ref 5.0–8.0)

## 2018-02-26 MED ORDER — HYDROCORTISONE ACETATE 25 MG RE SUPP: 25.0000 mg | Freq: Two times a day (BID) | RECTAL | 1 refills | Status: DC

## 2018-02-26 MED ORDER — METHYLDOPA 250 MG PO TABS: 250.0000 mg | ORAL_TABLET | Freq: Two times a day (BID) | ORAL | 3 refills | Status: DC

## 2018-02-26 MED FILL — HYDROCORTISONE ACETATE 25 M: 25 | 6 days supply | Qty: 12 | Fill #0

## 2018-02-26 NOTE — Progress Notes (Signed)
C/o hemorrhoids  

## 2018-02-26 NOTE — Patient Instructions (Signed)
Second Trimester of Pregnancy The second trimester is from week 13 through week 28, month 4 through 6. This is often the time in pregnancy that you feel your best. Often times, morning sickness has lessened or quit. You may have more energy, and you may get hungry more often. Your unborn baby (fetus) is growing rapidly. At the end of the sixth month, he or she is about 9 inches long and weighs about 1 pounds. You will likely feel the baby move (quickening) between 18 and 20 weeks of pregnancy. Follow these instructions at home:  Avoid all smoking, herbs, and alcohol. Avoid drugs not approved by your doctor.  Do not use any tobacco products, including cigarettes, chewing tobacco, and electronic cigarettes. If you need help quitting, ask your doctor. You may get counseling or other support to help you quit.  Only take medicine as told by your doctor. Some medicines are safe and some are not during pregnancy.  Exercise only as told by your doctor. Stop exercising if you start having cramps.  Eat regular, healthy meals.  Wear a good support bra if your breasts are tender.  Do not use hot tubs, steam rooms, or saunas.  Wear your seat belt when driving.  Avoid raw meat, uncooked cheese, and liter boxes and soil used by cats.  Take your prenatal vitamins.  Take 1500-2000 milligrams of calcium daily starting at the 20th week of pregnancy until you deliver your baby.  Try taking medicine that helps you poop (stool softener) as needed, and if your doctor approves. Eat more fiber by eating fresh fruit, vegetables, and whole grains. Drink enough fluids to keep your pee (urine) clear or pale yellow.  Take warm water baths (sitz baths) to soothe pain or discomfort caused by hemorrhoids. Use hemorrhoid cream if your doctor approves.  If you have puffy, bulging veins (varicose veins), wear support hose. Raise (elevate) your feet for 15 minutes, 3-4 times a day. Limit salt in your diet.  Avoid heavy  lifting, wear low heals, and sit up straight.  Rest with your legs raised if you have leg cramps or low back pain.  Visit your dentist if you have not gone during your pregnancy. Use a soft toothbrush to brush your teeth. Be gentle when you floss.  You can have sex (intercourse) unless your doctor tells you not to.  Go to your doctor visits. Get help if:  You feel dizzy.  You have mild cramps or pressure in your lower belly (abdomen).  You have a nagging pain in your belly area.  You continue to feel sick to your stomach (nauseous), throw up (vomit), or have watery poop (diarrhea).  You have bad smelling fluid coming from your vagina.  You have pain with peeing (urination). Get help right away if:  You have a fever.  You are leaking fluid from your vagina.  You have spotting or bleeding from your vagina.  You have severe belly cramping or pain.  You lose or gain weight rapidly.  You have trouble catching your breath and have chest pain.  You notice sudden or extreme puffiness (swelling) of your face, hands, ankles, feet, or legs.  You have not felt the baby move in over an hour.  You have severe headaches that do not go away with medicine.  You have vision changes. This information is not intended to replace advice given to you by your health care provider. Make sure you discuss any questions you have with your health care   provider. Document Released: 01/08/2010 Document Revised: 03/21/2016 Document Reviewed: 12/15/2012 Elsevier Interactive Patient Education  2017 Elsevier Inc.  

## 2018-02-26 NOTE — Progress Notes (Signed)
   PRENATAL VISIT NOTE  Subjective:  Michelle Christian is a 32 y.o. G3P2002 at [redacted]w[redacted]d being seen today for ongoing prenatal care.  She is currently monitored for the following issues for this high-risk pregnancy and has Hypertension; Major depression, recurrent (HCC); Anxiety state, unspecified; Chronic hypertension during pregnancy, antepartum; Supervision of high risk pregnancy, antepartum; History of postpartum depression, currently pregnant; Diabetes mellitus affecting pregnancy, antepartum; and Language barrier on their problem list.  Patient reports leg cramps.  Contractions: Not present. Vag. Bleeding: None.  Movement: Present. Denies leaking of fluid.   The following portions of the patient's history were reviewed and updated as appropriate: allergies, current medications, past family history, past medical history, past social history, past surgical history and problem list. Problem list updated.  Objective:   Vitals:   02/26/18 1123  BP: 115/88  Pulse: 81  Weight: 187 lb 4.8 oz (85 kg)    Fetal Status: Fetal Heart Rate (bpm): U/S   Movement: Present     General:  Alert, oriented and cooperative. Patient is in no acute distress.  Skin: Skin is warm and dry. No rash noted.   Cardiovascular: Normal heart rate noted  Respiratory: Normal respiratory effort, no problems with respiration noted  Abdomen: Soft, gravid, appropriate for gestational age.  Pain/Pressure: Absent     Pelvic: Cervical exam deferred        Extremities: Normal range of motion.  Edema: Mild pitting, slight indentation  Mental Status: Normal mood and affect. Normal behavior. Normal judgment and thought content.   Assessment and Plan:  Pregnancy: G3P2002 at [redacted]w[redacted]d  1. Chronic hypertension during pregnancy, antepartum Continue with normal BP - methyldopa (ALDOMET) 250 MG tablet; Take 1 tablet (250 mg total) by mouth 2 (two) times daily.  Dispense: 60 tablet; Refill: 3  2. Supervision of high risk pregnancy,  antepartum Hemorrhoid sx - hydrocortisone (ANUSOL-HC) 25 MG suppository; Place 1 suppository (25 mg total) rectally 2 (two) times daily.  Dispense: 12 suppository; Refill: 1  3. Diabetes mellitus affecting pregnancy, antepartum BG values all normal  Preterm labor symptoms and general obstetric precautions including but not limited to vaginal bleeding, contractions, leaking of fluid and fetal movement were reviewed in detail with the patient. Please refer to After Visit Summary for other counseling recommendations.  Return in about 2 weeks (around 03/12/2018).  Future Appointments  Date Time Provider Department Center  03/05/2018 10:30 AM [facility] [place] [facility]    Scheryl Darter, MD

## 2018-02-27 ENCOUNTER — Telehealth: Payer: Self-pay | Admitting: *Deleted

## 2018-02-27 ENCOUNTER — Encounter: Payer: Self-pay | Admitting: Obstetrics & Gynecology

## 2018-02-27 MED FILL — METHYLDOPA 250 MG TABLET: 250 | 30 days supply | Qty: 60 | Fill #0

## 2018-02-27 NOTE — Telephone Encounter (Signed)
Patient called front desk stating she can't get the aldomet because it is on back order.  Marylynn Pearson, RN discussed with Dr. Debroah Loop and order given when she finishes her current supply of aldoment may take labetolol  bid. Will send in rx today. Called patient with [employer]# [id number]. And she states they sent her her to outpatient pharmacy [facility] and she was able to get her presciption filled and she has it.  No other concerns voiced. Rx not sent in for labetolol.

## 2018-03-05 ENCOUNTER — Ambulatory Visit (INDEPENDENT_AMBULATORY_CARE_PROVIDER_SITE_OTHER): Payer: Self-pay | Admitting: Clinical

## 2018-03-05 DIAGNOSIS — F4322 Adjustment disorder with anxiety: Secondary | ICD-10-CM
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
## 2018-03-12 NOTE — BH Specialist Note (Deleted)
Integrated Behavioral Health Follow Up Visit  MRN: [id number] Name: Michelle Christian  Number of Integrated Behavioral Health Clinician visits: 3/6 Session Start time: ***  Session End time: *** Total time: {IBH Total Time:21014050}  Type of Service: Integrated Behavioral Health- Individual/Family Interpretor:Yes.   Interpretor Name and Language: Spanish, ***  SUBJECTIVE: Michelle Christian is a 32 y.o. female accompanied by {Patient accompanied by:[phone]} Patient was referred by *** for ***. Patient reports the following symptoms/concerns: *** Duration of problem: ***; Severity of problem: {Mild/Moderate/Severe:20260}  OBJECTIVE: Mood: {BHH MOOD:22306} and Affect: {BHH AFFECT:22307} Risk of harm to self or others: {CHL AMB BH Suicide Current Mental Status:21022748}  LIFE CONTEXT: Family and Social: *** School/Work: *** Self-Care: *** Life Changes: ***  GOALS ADDRESSED: Patient will: 1.  Reduce symptoms of: {IBH Symptoms:21014056}  2.  Increase knowledge and/or ability of: {IBH Patient Tools:21014057}  3.  Demonstrate ability to: {IBH Goals:21014053}  INTERVENTIONS: Interventions utilized:  {IBH Interventions:21014054} Standardized Assessments completed: {IBH Screening Tools:21014051}  ASSESSMENT: Patient currently experiencing ***.   Patient may benefit from ***.  PLAN: 1. Follow up with behavioral health clinician on : *** 2. Behavioral recommendations: *** 3. Referral(s): {IBH Referrals:21014055} 4. "From scale of 1-10, how likely are you to follow plan?": ***  Valetta Close Donnie Gedeon, LCSW

## 2018-03-13 ENCOUNTER — Ambulatory Visit (INDEPENDENT_AMBULATORY_CARE_PROVIDER_SITE_OTHER): Payer: Self-pay | Admitting: Family Medicine

## 2018-03-13 VITALS — BP 132/73 | HR 85 | Wt 189.8 lb

## 2018-03-13 DIAGNOSIS — O10919 Unspecified pre-existing hypertension complicating pregnancy, unspecified trimester: Secondary | ICD-10-CM

## 2018-03-13 DIAGNOSIS — O0990 Supervision of high risk pregnancy, unspecified, unspecified trimester: Secondary | ICD-10-CM

## 2018-03-13 DIAGNOSIS — O24919 Unspecified diabetes mellitus in pregnancy, unspecified trimester: Secondary | ICD-10-CM

## 2018-03-13 DIAGNOSIS — Z789 Other specified health status: Secondary | ICD-10-CM

## 2018-03-13 DIAGNOSIS — O0993 Supervision of high risk pregnancy, unspecified, third trimester: Secondary | ICD-10-CM

## 2018-03-13 LAB — POCT URINALYSIS DIP (DEVICE)
Bilirubin Urine: NEGATIVE
GLUCOSE, UA: NEGATIVE mg/dL
Hgb urine dipstick: NEGATIVE
KETONES UR: NEGATIVE mg/dL
Leukocytes, UA: NEGATIVE
Nitrite: NEGATIVE
PH: 6.5 (ref 5.0–8.0)
Protein, ur: NEGATIVE mg/dL
Specific Gravity, Urine: 1.01 (ref 1.005–1.030)
Urobilinogen, UA: 0.2 mg/dL (ref 0.0–1.0)

## 2018-03-13 NOTE — Patient Instructions (Signed)

## 2018-03-13 NOTE — Progress Notes (Signed)
   PRENATAL VISIT NOTE  Subjective:  Michelle Christian is a 32 y.o. G3P2002 at [redacted]w[redacted]d being seen today for ongoing prenatal care.  She is currently monitored for the following issues for this high-risk pregnancy and has Hypertension; Major depression, recurrent (HCC); Anxiety state, unspecified; Chronic hypertension during pregnancy, antepartum; Supervision of high risk pregnancy, antepartum; History of postpartum depression, currently pregnant; Diabetes mellitus affecting pregnancy, antepartum; and Language barrier on their problem list.  Patient reports no complaints.  Contractions: Not present. Vag. Bleeding: None.  Movement: Present. Denies leaking of fluid.   CBGs: Fasting 90s-103, postprandial, all less than 120  The following portions of the patient's history were reviewed and updated as appropriate: allergies, current medications, past family history, past medical history, past social history, past surgical history and problem list. Problem list updated.  Objective:   Vitals:   03/13/18 0906  BP: 132/73  Pulse: 85  Weight: 189 lb 12.8 oz (86.1 kg)    Fetal Status: Fetal Heart Rate (bpm): 143 Fundal Height: 27 cm Movement: Present     General:  Alert, oriented and cooperative. Patient is in no acute distress.  Skin: Skin is warm and dry. No rash noted.   Cardiovascular: Normal heart rate noted  Respiratory: Normal respiratory effort, no problems with respiration noted  Abdomen: Soft, gravid, appropriate for gestational age.  Pain/Pressure: Absent     Pelvic: Cervical exam deferred        Extremities: Normal range of motion.  Edema: Trace  Mental Status: Normal mood and affect. Normal behavior. Normal judgment and thought content.   Assessment and Plan:  Pregnancy: G3P2002 at [redacted]w[redacted]d  1. Supervision of high risk pregnancy, antepartum, third trimester 28-week labs today - CBC - HIV antibody - RPR - [place] [facility] OB FOLLOW UP; Future  2. Diabetes mellitus affecting pregnancy,  antepartum Postprandial CBGs all at goal, fasting mostly at goal, with a couple in the low 100s.  - Advised to started evening walks to help with fasting CBGs. May need meds if continues to rise - [place] [facility] FOLLOW UP; Future  3. Supervision of high risk pregnancy, antepartum  4. Chronic hypertension during pregnancy, antepartum Growth scan in 2 weeks - [place] [facility] FOLLOW UP; Future  5. Language barrier Spanish interpreter used  5. Hx anxiety Doing well. Has appt with Asher Muir next week  Preterm labor symptoms and general obstetric precautions including but not limited to vaginal bleeding, contractions, leaking of fluid and fetal movement were reviewed in detail with the patient. Please refer to After Visit Summary for other counseling recommendations.  Return in about 2 weeks (around 03/27/2018) for [facility].  Future Appointments  Date Time Provider Department Center  03/16/2018  1:00 PM [facility]INIC[facility]  03/27/2018  1:15 PM [facility] [place] 4 [facility]-M[facility]US MFC-US  04/03/2018  3:35 PM Nhia Heaphy, Kandra Nicolas, MD [facility]  04/16/2018  3:35 PM Sumner Bing, MD [facility]    Frederik Pear, MD

## 2018-03-13 NOTE — Progress Notes (Signed)
Spanish video interpreter "Marthenia Rolling" [phone]

## 2018-03-14 LAB — CBC
HEMATOCRIT: 29.4 % — AB (ref 34.0–46.6)
Hemoglobin: 9.6 g/dL — ABNORMAL LOW (ref 11.1–15.9)
MCH: 29.2 pg (ref 26.6–33.0)
MCHC: 32.7 g/dL (ref 31.5–35.7)
MCV: 89 fL (ref 79–97)
Platelets: 208 10*3/uL (ref 150–379)
RBC: 3.29 x10E6/uL — AB (ref 3.77–5.28)
RDW: 14.9 % (ref 12.3–15.4)
WBC: 7.5 10*3/uL (ref 3.4–10.8)

## 2018-03-14 LAB — HIV ANTIBODY (ROUTINE TESTING W REFLEX): HIV Screen 4th Generation wRfx: NONREACTIVE

## 2018-03-14 LAB — RPR: RPR: NONREACTIVE

## 2018-03-16 ENCOUNTER — Ambulatory Visit: Payer: Self-pay

## 2018-03-18 ENCOUNTER — Other Ambulatory Visit: Payer: Self-pay | Admitting: Family Medicine

## 2018-03-18 MED ORDER — FERROUS SULFATE 325 (65 FE) MG PO TABS: 325.0000 mg | ORAL_TABLET | Freq: Two times a day (BID) | ORAL | 2 refills | Status: DC

## 2018-03-19 ENCOUNTER — Telehealth: Payer: Self-pay | Admitting: *Deleted

## 2018-03-19 ENCOUNTER — Ambulatory Visit (INDEPENDENT_AMBULATORY_CARE_PROVIDER_SITE_OTHER): Payer: Self-pay | Admitting: Clinical

## 2018-03-19 ENCOUNTER — Telehealth: Payer: Self-pay

## 2018-03-19 DIAGNOSIS — O24419 Gestational diabetes mellitus in pregnancy, unspecified control: Secondary | ICD-10-CM

## 2018-03-19 DIAGNOSIS — F4322 Adjustment disorder with anxiety: Secondary | ICD-10-CM

## 2018-03-19 MED ORDER — FERROUS SULFATE 325 (65 FE) MG PO TABS: 325.0000 mg | ORAL_TABLET | Freq: Two times a day (BID) | ORAL | 2 refills | Status: DC

## 2018-03-19 MED ORDER — GLUCOSE BLOOD VI STRP: ORAL_STRIP | 12 refills | Status: DC

## 2018-03-19 MED FILL — FERROUS SULFATE 325 MG TAB: 325 (65 FE) | 50 days supply | Qty: 100 | Fill #0

## 2018-03-19 NOTE — Telephone Encounter (Signed)
Called pt with Spanish Interpreter Michelle Christian, Integrated Behavioral Health Clinician, notified pt of the need for iron.  Pt stated understanding with no further questions.

## 2018-03-19 NOTE — Telephone Encounter (Signed)
Received a voicemail from pharmacy stating they need to clarify orders from Dr. Nira Retort re: what type strips she needs for what type meter. I called and clarified per chart she has accucheck aviva plus.
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
## 2018-03-19 NOTE — Addendum Note (Signed)
Addended by: Kathee Delton on: 03/19/2018 01:52 PM   Modules accepted: Orders

## 2018-03-19 NOTE — Telephone Encounter (Signed)
-----   Message from Frederik Pear, MD sent at 03/18/2018 11:01 PM EDT ----- Pt needs iron supplementation. Rx sent to pharmacy. Please call patient and let her know.

## 2018-03-20 MED ORDER — FERROUS SULFATE 325 (65 FE) MG PO TBEC: 325.0000 mg | DELAYED_RELEASE_TABLET | Freq: Two times a day (BID) | ORAL | 3 refills | Status: AC

## 2018-03-20 NOTE — Addendum Note (Signed)
Addended by: Frederik Pear on: 03/20/2018 12:46 AM   Modules accepted: Orders

## 2018-03-20 NOTE — Progress Notes (Signed)
Rx called into different pharmacy per patient's request.

## 2018-03-26 MED FILL — METHYLDOPA 250 MG TABLET: 250 | 30 days supply | Qty: 60 | Fill #1

## 2018-03-27 ENCOUNTER — Ambulatory Visit (INDEPENDENT_AMBULATORY_CARE_PROVIDER_SITE_OTHER): Payer: Self-pay | Admitting: Obstetrics and Gynecology

## 2018-03-27 ENCOUNTER — Ambulatory Visit (HOSPITAL_COMMUNITY): Payer: Self-pay

## 2018-03-27 ENCOUNTER — Encounter: Payer: Self-pay | Admitting: Obstetrics and Gynecology

## 2018-03-27 ENCOUNTER — Ambulatory Visit (HOSPITAL_COMMUNITY)
Admission: RE | Admit: 2018-03-27 | Discharge: 2018-03-27 | Disposition: A | Discharge status: Home / Self Care | Payer: Self-pay | Source: Ambulatory Visit | Attending: Family Medicine | Admitting: Family Medicine

## 2018-03-27 VITALS — BP 130/80 | HR 100 | Wt 189.7 lb

## 2018-03-27 DIAGNOSIS — O24113 Pre-existing diabetes mellitus, type 2, in pregnancy, third trimester: Secondary | ICD-10-CM | POA: Insufficient documentation

## 2018-03-27 DIAGNOSIS — O10919 Unspecified pre-existing hypertension complicating pregnancy, unspecified trimester: Secondary | ICD-10-CM

## 2018-03-27 DIAGNOSIS — Z8659 Personal history of other mental and behavioral disorders: Secondary | ICD-10-CM

## 2018-03-27 DIAGNOSIS — O10913 Unspecified pre-existing hypertension complicating pregnancy, third trimester: Secondary | ICD-10-CM | POA: Insufficient documentation

## 2018-03-27 DIAGNOSIS — Z3A29 29 weeks gestation of pregnancy: Secondary | ICD-10-CM | POA: Insufficient documentation

## 2018-03-27 DIAGNOSIS — O9989 Other specified diseases and conditions complicating pregnancy, childbirth and the puerperium: Secondary | ICD-10-CM

## 2018-03-27 DIAGNOSIS — O99891 Other specified diseases and conditions complicating pregnancy: Secondary | ICD-10-CM

## 2018-03-27 DIAGNOSIS — O0990 Supervision of high risk pregnancy, unspecified, unspecified trimester: Secondary | ICD-10-CM

## 2018-03-27 DIAGNOSIS — O24919 Unspecified diabetes mellitus in pregnancy, unspecified trimester: Secondary | ICD-10-CM

## 2018-03-27 DIAGNOSIS — O0993 Supervision of high risk pregnancy, unspecified, third trimester: Secondary | ICD-10-CM | POA: Insufficient documentation

## 2018-03-27 NOTE — Patient Instructions (Signed)
Common Medications Safe in Pregnancy  Acne:      Constipation:  Benzoyl Peroxide     Colace  Clindamycin      Dulcolax Suppository  Topica Erythromycin     Fibercon  Salicylic Acid      Metamucil         Miralax AVOID:        Senakot   Accutane    Cough:  Retin-A       Cough Drops  Tetracycline      Phenergan w/ Codeine if Rx  Minocycline      Robitussin (Plain & DM)  Antibiotics:     Crabs/Lice:  Ceclor       RID  Cephalosporins    AVOID:  E-Mycins      Kwell  Keflex  Macrobid/Macrodantin   Diarrhea:  Penicillin      Kao-Pectate  Zithromax      Imodium AD         PUSH FLUIDS AVOID:       Cipro     Fever:  Tetracycline      Tylenol (Regular or Extra  Minocycline       Strength)  Levaquin      Extra Strength-Do not          Exceed 8 tabs/24 hrs Caffeine:        <200mg/day (equiv. To 1 cup of coffee or  approx. 3 12 oz sodas)         Gas: Cold/Hayfever:       Gas-X  Benadryl      Mylicon  Claritin       Phazyme  **Claritin-D        Chlor-Trimeton    Headaches:  Dimetapp      ASA-Free Excedrin  Drixoral-Non-Drowsy     Cold Compress  Mucinex (Guaifenasin)     Tylenol (Regular or Extra  Sudafed/Sudafed-12 Hour     Strength)  **Sudafed PE Pseudoephedrine   Tylenol Cold & Sinus     Vicks Vapor Rub  Zyrtec  **AVOID if Problems With Blood Pressure         Heartburn: Avoid lying down for at least 1 hour after meals  Aciphex      Maalox     Rash:  Milk of Magnesia     Benadryl    Mylanta       1% Hydrocortisone Cream  Pepcid  Pepcid Complete   Sleep Aids:  Prevacid      Ambien   Prilosec       Benadryl  Rolaids       Chamomile Tea  Tums (Limit 4/day)     Unisom  Zantac       Tylenol PM         Warm milk-add vanilla or  Hemorrhoids:       Sugar for taste  Anusol/Anusol H.C.  (RX: Analapram 2.5%)  Sugar Substitutes:  Hydrocortisone OTC     Ok in moderation  Preparation H      Tucks        Vaseline lotion applied to tissue with  wiping    Herpes:     Throat:  Acyclovir      Oragel  Famvir  Valtrex     Vaccines:         Flu Shot Leg Cramps:       *Gardasil  Benadryl      Hepatitis A         Hepatitis B Nasal Spray:         Pneumovax  Saline Nasal Spray     Polio Booster         Tetanus Nausea:       Tuberculosis test or PPD  Vitamin B6 25 mg TID   AVOID:    Dramamine      *Gardasil  Emetrol       Live Poliovirus  Ginger Root 250 mg QID    MMR (measles, mumps &  High Complex Carbs @ Bedtime    rebella)  Sea Bands-Accupressure    Varicella (Chickenpox)  Unisom 1/2 tab TID     *No known complications           If received before Pain:         Known pregnancy;   Darvocet       Resume series after  Lortab        Delivery  Percocet    Yeast:   Tramadol      Femstat  Tylenol 3      Gyne-lotrimin  Ultram       Monistat  Vicodin           MISC:         All Sunscreens           Hair Coloring/highlights          Insect Repellant's          (Including DEET)         Mystic Tans Tercer trimestre de Media planner (Third Trimester of Pregnancy) El tercer trimestre comprende desde la [id number] hasta la [id number], es decir, desde el mes7 hasta el mes9. En este trimestre, el feto crece muy rpido. Hacia el final del noveno mes, el feto mide alrededor de 20pulgadas (45cm) de largo y pesa entre 6y 10libras [phone]). CUIDADOS EN EL HOGAR  No fume, no consuma hierbas ni beba alcohol. No tome frmacos que el mdico no haya autorizado.  No consuma ningn producto que contenga tabaco, lo que incluye cigarrillos, tabaco de Higher education careers adviser o Psychologist, sport and exercise. Si necesita ayuda para dejar de fumar, consulte al [employer]. Puede recibir asesoramiento u otro tipo de apoyo para dejar de fumar.  Tome los medicamentos solamente como se lo haya indicado el mdico. Algunos medicamentos son seguros para tomar durante el Media planner y otros no lo son.  Haga ejercicios solamente como se lo haya indicado el mdico. Interrumpa la  actividad fsica si comienza a tener calambres.  Ingiera alimentos saludables de [place] regular.  Use un sostn que le brinde buen soporte si sus mamas estn sensibles.  No se d baos de inmersin en agua caliente, baos turcos ni saunas.  Colquese el cinturn de seguridad cuando conduzca.  No coma carne cruda ni queso sin cocinar; evite el contacto con las bandejas sanitarias de los gatos y la tierra que estos animales usan.  [place].  Tome entre 1500 y '2000mg'$  de calcio diariamente comenzando en la [id number] del embarazo [place].  Pruebe tomar un medicamento que la ayude a defecar (un laxante suave) si el mdico lo autoriza. Consuma ms fibra, que se encuentra en las frutas y verduras frescas y los cereales integrales. Beba suficiente lquido para mantener el pis (orina) claro o de color amarillo plido.  Dese baos de asiento con agua tibia para Best boy o las molestias causadas por las hemorroides. Use una crema para las hemorroides si el mdico la autoriza.  Si se le hinchan las venas (venas varicosas), use medias de descanso. Levante (eleve) los  pies durante 33mnutos, 3 o 4veces por da. Limite el consumo de sal en su dieta.  No levante objetos pesados, use zapatos de tacones bajos y sintese derecha.  Descanse con las piernas elevadas si tiene calambres o dolor de cintura.  Visite a su dentista si no lo ha hQuarry manager Use un cepillo de cerdas suaves para cepillarse los dientes. Psese el hilo dental con suavidad.  Puede seguir mAmerican Electric Power a menos que el mdico le indique lo contrario.  No haga viajes de larga distancia, excepto si es obligatorio y solamente con la aprobacin del mdico.  Tome clases prenatales.  Practique ir manejando al hospital.  Prepare el bolso que llevar al hospital.  Prepare la habitacin del beb.  Concurra a los controles mdicos.  SOLICITE AYUDA SI:  No est  segura de si est en trabajo de parto o si ha roto la bolsa de las aguas.  Tiene mareos.  Siente calambres leves o presin en la parte inferior del abdomen.  Sufre un dolor persistente en el abdomen.  Tiene mHigher education careers adviser(nuseas), vmitos, o tiene deposiciones acuosas (diarrea).  Advierte un olor ftido que proviene de la vagina.  Siente dolor al oContinental Airlines  SOLICITE AYUDA DE INMEDIATO SI:  Tiene fiebre.  Tiene una prdida de lquido por la vagina.  Tiene sangrado o pequeas prdidas vaginales.  Siente dolor intenso o clicos en el abdomen.  Sube o baja de peso rpidamente.  Tiene dificultades para recuperar el aliento y siente dolor en el pecho.  Sbitamente se le hinchan mucho el rostro, las mHuntington Woods los tobillos, los pies o las piernas.  No ha sentido los movimientos del beb durante uLeone Brand  Siente un dolor de cabeza intenso que no se alivia con medicamentos.  Su visin se modifica.  Esta informacin no tiene cMarine scientistel consejo del mdico. Asegrese de hacerle al mdico cualquier pregunta que tenga. Document Released: 06/16/2013 Document Revised: 11/04/2014 Document Reviewed: 12/15/2012 Elsevier Interactive Patient Education  2017 [employer]

## 2018-03-27 NOTE — Progress Notes (Signed)
Subjective:  Michelle Christian is a 32 y.o. G3P2002 at [redacted]w[redacted]d being seen today for ongoing prenatal care.  She is currently monitored for the following issues for this high-risk pregnancy and has Hypertension; Major depression, recurrent (HCC); Anxiety state, unspecified; Chronic hypertension during pregnancy, antepartum; Supervision of high risk pregnancy, antepartum; History of postpartum depression, currently pregnant; Diabetes mellitus affecting pregnancy, antepartum; and Language barrier on their problem list.  Patient reports URI sx.  Contractions: Not present. Vag. Bleeding: None.  Movement: Present. Denies leaking of fluid.   The following portions of the patient's history were reviewed and updated as appropriate: allergies, current medications, past family history, past medical history, past social history, past surgical history and problem list. Problem list updated.  Objective:   Vitals:   03/27/18 0911 03/27/18 0915  BP: (!) 135/94 130/80  Pulse: 95 100  Weight: 189 lb 11.2 oz (86 kg)     Fetal Status: Fetal Heart Rate (bpm): 140   Movement: Present     General:  Alert, oriented and cooperative. Patient is in no acute distress.  Skin: Skin is warm and dry. No rash noted.   Cardiovascular: Normal heart rate noted  Respiratory: Normal respiratory effort, no problems with respiration noted  Abdomen: Soft, gravid, appropriate for gestational age. Pain/Pressure: Present     Pelvic:  Cervical exam deferred        Extremities: Normal range of motion.  Edema: None  Mental Status: Normal mood and affect. Normal behavior. Normal judgment and thought content.   Urinalysis:      Assessment and Plan:  Pregnancy: G3P2002 at [redacted]w[redacted]d  1. Supervision of high risk pregnancy, antepartum Stable  Desires tubal, pt is self pay. Pt made aware would have to pay up front if desires She will consider Meds safe in pregnancy provided to pt  2. Chronic hypertension during pregnancy, antepartum BP stable  on BASA and Aldomet Growth scan today Start weekly antenatal testing next appt  3. DM CBG's in goal range Continue with diet Growth scan today Antenatal testing starting with next visit   Preterm labor symptoms and general obstetric precautions including but not limited to vaginal bleeding, contractions, leaking of fluid and fetal movement were reviewed in detail with the patient. Please refer to After Visit Summary for other counseling recommendations.  Return in about 2 weeks (around 04/10/2018) for OB visit.   Hermina Staggers, MD

## 2018-04-03 ENCOUNTER — Encounter: Payer: Self-pay | Admitting: Family Medicine

## 2018-04-06 ENCOUNTER — Telehealth: Payer: Self-pay | Admitting: *Deleted

## 2018-04-06 NOTE — Telephone Encounter (Signed)
Called pt w/interpreter Michelle PerkingMaria Christian and informed her that she will not need the appt on 6/13 @ 1315 for fetal testing. These tests will begin once she is [redacted] wks gestation. She should keep the appt which is scheduled on 6/13 @ 1435 to see Dr. Shawnie Christian.  Pt voiced understanding.

## 2018-04-09 ENCOUNTER — Ambulatory Visit (INDEPENDENT_AMBULATORY_CARE_PROVIDER_SITE_OTHER): Payer: Self-pay | Admitting: Family Medicine

## 2018-04-09 ENCOUNTER — Other Ambulatory Visit: Payer: Self-pay

## 2018-04-09 ENCOUNTER — Encounter: Payer: Self-pay | Admitting: Family Medicine

## 2018-04-09 VITALS — BP 115/81 | HR 93 | Wt 187.0 lb

## 2018-04-09 DIAGNOSIS — O0993 Supervision of high risk pregnancy, unspecified, third trimester: Secondary | ICD-10-CM

## 2018-04-09 DIAGNOSIS — Z789 Other specified health status: Secondary | ICD-10-CM

## 2018-04-09 DIAGNOSIS — O10913 Unspecified pre-existing hypertension complicating pregnancy, third trimester: Secondary | ICD-10-CM

## 2018-04-09 DIAGNOSIS — O10919 Unspecified pre-existing hypertension complicating pregnancy, unspecified trimester: Secondary | ICD-10-CM

## 2018-04-09 DIAGNOSIS — O24913 Unspecified diabetes mellitus in pregnancy, third trimester: Secondary | ICD-10-CM

## 2018-04-09 DIAGNOSIS — O24919 Unspecified diabetes mellitus in pregnancy, unspecified trimester: Secondary | ICD-10-CM

## 2018-04-09 LAB — POCT URINALYSIS DIP (DEVICE)
BILIRUBIN URINE: NEGATIVE
Glucose, UA: NEGATIVE mg/dL
Hgb urine dipstick: NEGATIVE
KETONES UR: NEGATIVE mg/dL
Leukocytes, UA: NEGATIVE
Nitrite: NEGATIVE
PH: 6.5 (ref 5.0–8.0)
PROTEIN: NEGATIVE mg/dL
Specific Gravity, Urine: 1.01 (ref 1.005–1.030)
Urobilinogen, UA: 0.2 mg/dL (ref 0.0–1.0)

## 2018-04-09 NOTE — Progress Notes (Signed)
   PRENATAL VISIT NOTE  Subjective:  Michelle Christian is a 32 y.o. G3P2002 at 8319w0d being seen today for ongoing prenatal care.  She is currently monitored for the following issues for this high-risk pregnancy and has Hypertension; Major depression, recurrent (HCC); Anxiety state, unspecified; Chronic hypertension during pregnancy, antepartum; Supervision of high risk pregnancy, antepartum, third trimester; History of postpartum depression, currently pregnant; Diabetes mellitus affecting pregnancy, antepartum; and Language barrier on their problem list.  Patient reports backache.  Contractions: Not present. Vag. Bleeding: None.  Movement: Present. Denies leaking of fluid.   The following portions of the patient's history were reviewed and updated as appropriate: allergies, current medications, past family history, past medical history, past social history, past surgical history and problem list. Problem list updated.  Objective:   Vitals:   04/09/18 1549  BP: 115/81  Pulse: 93  Weight: 187 lb (84.8 kg)    Fetal Status: Fetal Heart Rate (bpm): 142 Fundal Height: 30 cm Movement: Present     General:  Alert, oriented and cooperative. Patient is in no acute distress.  Skin: Skin is warm and dry. No rash noted.   Cardiovascular: Normal heart rate noted  Respiratory: Normal respiratory effort, no problems with respiration noted  Abdomen: Soft, gravid, appropriate for gestational age.  Pain/Pressure: Present     Pelvic: Cervical exam deferred        Extremities: Normal range of motion.  Edema: Trace  Mental Status: Normal mood and affect. Normal behavior. Normal judgment and thought content.   Assessment and Plan:  Pregnancy: G3P2002 at 3419w0d  1. Chronic hypertension during pregnancy, antepartum BP is well controlled Continue Aldomet and Baby ASA  2. Diabetes mellitus affecting pregnancy, antepartum FBS 97-95 2 hour pp 75-97 U/s 5/31 vtx,  AFI WNL, EFW 2 lb 14 oz 46%  3. Language  barrier Spanish interpreter: Video Darl PikesSusan used   4. Supervision of high risk pregnancy, antepartum, third trimester   Preterm labor symptoms and general obstetric precautions including but not limited to vaginal bleeding, contractions, leaking of fluid and fetal movement were reviewed in detail with the patient. Please refer to After Visit Summary for other counseling recommendations.  Return in 1 week (on 04/16/2018) for needs MD, OB visit and BPP needs Thursday.  Future Appointments  Date Time Provider Department Center  04/21/2018  9:15 AM [facility]  04/21/2018 10:15 AM Anyanwu, Jethro BastosUgonna A, MD [facility]    Reva Boresanya S Gayland Nicol, MD

## 2018-04-09 NOTE — Patient Instructions (Signed)
 Lactancia materna Breastfeeding Decidir amamantar es una de las mejores elecciones que puede hacer por usted y su beb. Un cambio en las hormonas durante el embarazo hace que las mamas produzcan leche materna en las glndulas productoras de leche. Las hormonas impiden que la leche materna sea liberada antes del nacimiento del beb. Adems, impulsan el flujo de leche luego del nacimiento. Una vez que ha comenzado a amamantar, pensar en el beb, as como la succin o el llanto, pueden estimular la liberacin de leche de las glndulas productoras de leche. Los beneficios de amamantar Las investigaciones demuestran que la lactancia materna ofrece muchos beneficios de salud para bebs y madres. Adems, ofrece una forma gratuita y conveniente de alimentar al beb. Para el beb  La primera leche (calostro) ayuda a mejorar el funcionamiento del aparato digestivo del beb.  Las clulas especiales de la leche (anticuerpos) ayudan a combatir las infecciones en el beb.  Los bebs que se alimentan con leche materna tambin tienen menos probabilidades de tener asma, alergias, obesidad o diabetes de tipo 2. Adems, tienen menor riesgo de sufrir el sndrome de muerte sbita del lactante (SMSL).  Los nutrientes de la leche materna son mejores para satisfacer las necesidades del beb en comparacin con la leche maternizada.  La leche materna mejora el desarrollo cerebral del beb. Para usted  La lactancia materna favorece el desarrollo de un vnculo muy especial entre la madre y el beb.  Es conveniente. La leche materna es econmica y siempre est disponible a la temperatura correcta.  La lactancia materna ayuda a quemar caloras. Le ayuda a perder el peso ganado durante el embarazo.  Hace que el tero vuelva al tamao que tena antes del embarazo ms rpido. Adems, disminuye el sangrado (loquios) despus del parto.  La lactancia materna contribuye a reducir el riesgo de tener diabetes de tipo 2,  osteoporosis, artritis reumatoide, enfermedades cardiovasculares y cncer de mama, ovario, tero y endometrio en el futuro. Informacin bsica sobre la lactancia Comienzo de la lactancia  Encuentre un lugar cmodo para sentarse o acostarse, con un buen respaldo para el cuello y la espalda.  Coloque una almohada o una manta enrollada debajo del beb para acomodarlo a la altura de la mama (si est sentada). Las almohadas para amamantar se han diseado especialmente a fin de servir de apoyo para los brazos y el beb mientras amamanta.  Asegrese de que la barriga del beb (abdomen) est frente a la suya.  Masajee suavemente la mama. Con las yemas de los dedos, masajee los bordes exteriores de la mama hacia adentro, en direccin al pezn. Esto estimula el flujo de leche. Si la leche fluye lentamente, es posible que deba continuar con este movimiento durante la lactancia.  Sostenga la mama con 4 dedos por debajo y el pulgar por arriba del pezn (forme la letra "C" con la mano). Asegrese de que los dedos se encuentren lejos del pezn y de la boca del beb.  Empuje suavemente los labios del beb con el pezn o con el dedo.  Cuando la boca del beb se abra lo suficiente, acrquelo rpidamente a la mama e introduzca todo el pezn y la arola, tanto como sea posible, dentro de la boca del beb. La arola es la zona de color que rodea al pezn. ? Debe haber ms arola visible por arriba del labio superior del beb que por debajo del labio inferior. ? Los labios del beb deben estar abiertos y extendidos hacia afuera (evertidos) para asegurar que   el beb se prenda de forma adecuada y cmoda. ? La lengua del beb debe estar entre la enca inferior y la mama.  Asegrese de que la boca del beb est en la posicin correcta alrededor del pezn (prendido). Los labios del beb deben crear un sello sobre la mama y estar doblados hacia afuera (invertidos).  Es comn que el beb succione durante 2 a 3 minutos  para que comience el flujo de leche materna. Cmo debe prenderse Es muy importante que le ensee al beb cmo prenderse adecuadamente a la mama. Si el beb no se prende adecuadamente, puede causar dolor en los pezones, reducir la produccin de leche materna y hacer que el beb tenga un escaso aumento de peso. Adems, si el beb no se prende adecuadamente al pezn, puede tragar aire durante la alimentacin. Esto puede causarle molestias al beb. Hacer eructar al beb al cambiar de mama puede ayudarlo a liberar el aire. Sin embargo, ensearle al beb cmo prenderse a la mama adecuadamente es la mejor manera de evitar que se sienta molesto por tragar aire mientras se alimenta. Signos de que el beb se ha prendido adecuadamente al pezn  Tironea o succiona de modo silencioso, sin causarle dolor. Los labios del beb deben estar extendidos hacia afuera (evertidos).  Se escucha que traga cada 3 o 4 succiones una vez que la leche ha comenzado a fluir (despus de que se produzca el reflejo de eyeccin de la leche).  Hay movimientos musculares por arriba y por delante de sus odos al succionar.  Signos de que el beb no se ha prendido adecuadamente al pezn  Hace ruidos de succin o de chasquido mientras se alimenta.  Siente dolor en los pezones.  Si cree que el beb no se prendi correctamente, deslice el dedo en la comisura de la boca y colquelo entre las encas del beb para interrumpir la succin. Intente volver a comenzar a amamantar. Signos de lactancia materna exitosa Signos del beb  El beb disminuir gradualmente el nmero de succiones o dejar de succionar por completo.  El beb se quedar dormido.  El cuerpo del beb se relajar.  El beb retendr una pequea cantidad de leche en la boca.  El beb se desprender solo del pecho.  Signos que presenta usted  Las mamas han aumentado la firmeza, el peso y el tamao 1 a 3 horas despus de amamantar.  Estn ms blandas inmediatamente  despus de amamantar.  Se producen un aumento del volumen de leche y un cambio en su consistencia y color hacia el quinto da de lactancia.  Los pezones no duelen, no estn agrietados ni sangran.  Signos de que su beb recibe la cantidad de leche suficiente  Mojar por lo menos 1 o 2paales durante las primeras 24horas despus del nacimiento.  Mojar por lo menos 5 o 6paales cada 24horas durante la primera semana despus del nacimiento. La orina debe ser clara o de color amarillo plido a los 5das de vida.  Mojar entre 6 y 8paales cada 24horas a medida que el beb sigue creciendo y desarrollndose.  Defeca por lo menos 3 veces en 24 horas a los 5 das de vida. Las heces deben ser blandas y amarillentas.  Defeca por lo menos 3 veces en 24 horas a los 7 das de vida. Las heces deben ser grumosas y amarillentas.  No registra una prdida de peso mayor al 10% del peso al nacer durante los primeros 3 das de vida.  Aumenta de peso un   promedio de 4 a 7onzas (113 a 198g) por semana despus de los 4 das de vida.  Aumenta de peso, diariamente, de manera uniforme a partir de los 5 das de vida, sin registrar prdida de peso despus de las 2semanas de vida. Despus de alimentarse, es posible que el beb regurgite una pequea cantidad de leche. Esto es normal. Frecuencia y duracin de la lactancia El amamantamiento frecuente la ayudar a producir ms leche y puede prevenir dolores en los pezones y las mamas extremadamente llenas (congestin mamaria). Alimente al beb cuando muestre signos de hambre o si siente la necesidad de reducir la congestin de las mamas. Esto se denomina "lactancia a demanda". Las seales de que el beb tiene hambre incluyen las siguientes:  Aumento del estado de alerta, actividad o inquietud.  Mueve la cabeza de un lado a otro.  Abre la boca cuando se le toca la mejilla o la comisura de la boca (reflejo de bsqueda).  Aumenta las vocalizaciones, tales como  sonidos de succin, se relame los labios, emite arrullos, suspiros o chirridos.  Mueve la mano hacia la boca y se chupa los dedos o las manos.  Est molesto o llora.  Evite el uso del chupete en las primeras 4 a 6 semanas despus del nacimiento del beb. Despus de este perodo, podr usar un chupete. Las investigaciones demostraron que el uso del chupete durante el primer ao de vida del beb disminuye el riesgo de tener el sndrome de muerte sbita del lactante (SMSL). Permita que el nio se alimente en cada mama todo lo que desee. Cuando el beb se desprende o se queda dormido mientras se est alimentando de la primera mama, ofrzcale la segunda. Debido a que, con frecuencia, los recin nacidos estn somnolientos las primeras semanas de vida, es posible que deba despertar al beb para alimentarlo. Los horarios de lactancia varan de un beb a otro. Sin embargo, las siguientes reglas pueden servir como gua para ayudarla a garantizar que el beb se alimenta adecuadamente:  Se puede amamantar a los recin nacidos (bebs de 4 semanas o menos de vida) cada 1 a 3 horas.  No deben transcurrir ms de 3 horas durante el da o 5 horas durante la noche sin que se amamante a los recin nacidos.  Debe amamantar al beb un mnimo de 8 veces en un perodo de 24 horas.  Extraccin de leche materna La extraccin y el almacenamiento de la leche materna le permiten asegurarse de que el beb se alimente exclusivamente de su leche materna, aun en momentos en los que no puede amamantar. Esto tiene especial importancia si debe regresar al trabajo en el perodo en que an est amamantando o si no puede estar presente en los momentos en que el beb debe alimentarse. Su asesor en lactancia puede ayudarla a encontrar un mtodo de extraccin que funcione mejor para usted y orientarla sobre cunto tiempo es seguro almacenar leche materna. Cmo cuidar las mamas durante la lactancia Los pezones pueden secarse, agrietarse y  doler durante la lactancia. Las siguientes recomendaciones pueden ayudarla a mantener las mamas humectadas y sanas:  Evite usar jabn en los pezones.  Use un sostn de soporte diseado especialmente para la lactancia materna. Evite usar sostenes con aro o sostenes muy ajustados (sostenes deportivos).  Seque al aire sus pezones durante 3 a 4minutos despus de amamantar al beb.  Utilice solo apsitos de algodn en el sostn para absorber las prdidas de leche. La prdida de un poco de leche materna   entre las tomas es normal.  Utilice lanolina sobre los pezones luego de amamantar. La lanolina ayuda a mantener la humedad normal de la piel. La lanolina pura no es perjudicial (no es txica) para el beb. Adems, puede extraer manualmente algunas gotas de leche materna y masajear suavemente esa leche sobre los pezones para que la leche se seque al aire.  Durante las primeras semanas despus del nacimiento, algunas mujeres experimentan congestin mamaria. La congestin mamaria puede hacer que sienta las mamas pesadas, calientes y sensibles al tacto. El pico de la congestin mamaria ocurre en el plazo de los 3 a 5 das despus del parto. Las siguientes recomendaciones pueden ayudarla a aliviar la congestin mamaria:  Vace por completo las mamas al amamantar o extraer leche. Puede aplicar calor hmedo en las mamas (en la ducha o con toallas hmedas para manos) antes de amamantar o extraer leche. Esto aumenta la circulacin y ayuda a que la leche fluya. Si el beb no vaca por completo las mamas cuando lo amamanta, extraiga la leche restante despus de que haya finalizado.  Aplique compresas de hielo sobre las mamas inmediatamente despus de amamantar o extraer leche, a menos que le resulte demasiado incmodo. Haga lo siguiente: ? Ponga el hielo en una bolsa plstica. ? Coloque una toalla entre la piel y la bolsa de hielo. ? Coloque el hielo durante 20minutos, 2 o 3veces por da.  Asegrese de que el  beb est prendido y se encuentre en la posicin correcta mientras lo alimenta.  Si la congestin mamaria persiste luego de 48 horas o despus de seguir estas recomendaciones, comunquese con su mdico o un asesor en lactancia. Recomendaciones de salud general durante la lactancia  Consuma 3 comidas y 3 colaciones saludables todos los das. Las madres bien alimentadas que amamantan necesitan entre 450 y 500 caloras adicionales por da. Puede cumplir con este requisito al aumentar la cantidad de una dieta equilibrada que realice.  Beba suficiente agua para mantener la orina clara o de color amarillo plido.  Descanse con frecuencia, reljese y siga tomando sus vitaminas prenatales para prevenir la fatiga, el estrs y los niveles bajos de vitaminas y minerales en el cuerpo (deficiencias de nutrientes).  No consuma ningn producto que contenga nicotina o tabaco, como cigarrillos y cigarrillos electrnicos. El beb puede verse afectado por las sustancias qumicas de los cigarrillos que pasan a la leche materna y por la exposicin al humo ambiental del tabaco. Si necesita ayuda para dejar de fumar, consulte al mdico.  Evite el consumo de alcohol.  No consuma drogas ilegales o marihuana.  Antes de usar cualquier medicamento, hable con el mdico. Estos incluyen medicamentos recetados y de venta libre, como tambin vitaminas y suplementos a base de hierbas. Algunos medicamentos, que pueden ser perjudiciales para el beb, pueden pasar a travs de la leche materna.  Puede quedar embarazada durante la lactancia. Si se desea un mtodo anticonceptivo, consulte al mdico sobre cules son las opciones seguras durante la lactancia. Dnde encontrar ms informacin: Liga internacional La Leche: www.llli.org. Comunquese con un mdico si:  Siente que quiere dejar de amamantar o se siente frustrada con la lactancia.  Sus pezones estn agrietados o sangran.  Sus mamas estn irritadas, sensibles o  calientes.  Tiene los siguientes sntomas: ? Dolor en las mamas o en los pezones. ? Un rea hinchada en cualquiera de las mamas. ? Fiebre o escalofros. ? Nuseas o vmitos. ? Drenaje de otro lquido distinto de la leche materna desde los pezones.    Sus mamas no se llenan antes de amamantar al beb para el quinto da despus del parto.  Se siente triste y deprimida.  El beb: ? Est demasiado somnoliento como para comer bien. ? Tiene problemas para dormir. ? Tiene ms de 1 semana de vida y moja menos de 6 paales en un periodo de 24 horas. ? No ha aumentado de peso a los 5 das de vida.  El beb defeca menos de 3 veces en 24 horas.  La piel del beb o las partes blancas de los ojos se vuelven amarillentas. Solicite ayuda de inmediato si:  El beb est muy cansado (letargo) y no se quiere despertar para comer.  Le sube la fiebre sin causa. Resumen  La lactancia materna ofrece muchos beneficios de salud para bebs y madres.  Intente amamantar a su beb cuando muestre signos tempranos de hambre.  Haga cosquillas o empuje suavemente los labios del beb con el dedo o el pezn para lograr que el beb abra la boca. Acerque el beb a la mama. Asegrese de que la mayor parte de la arola se encuentre dentro de la boca del beb. Ofrzcale una mama y haga eructar al beb antes de pasar a la otra.  Hable con su mdico o asesor en lactancia si tiene dudas o problemas con la lactancia. Esta informacin no tiene como fin reemplazar el consejo del mdico. Asegrese de hacerle al mdico cualquier pregunta que tenga. Document Released: 10/14/2005 Document Revised: 02/03/2017 Document Reviewed: 02/03/2017 Elsevier Interactive Patient Education  2018 [employer]  

## 2018-04-16 ENCOUNTER — Encounter: Payer: Self-pay | Admitting: Obstetrics and Gynecology

## 2018-04-21 ENCOUNTER — Ambulatory Visit: Payer: Self-pay

## 2018-04-21 ENCOUNTER — Ambulatory Visit (INDEPENDENT_AMBULATORY_CARE_PROVIDER_SITE_OTHER): Payer: Self-pay | Admitting: Obstetrics & Gynecology

## 2018-04-21 ENCOUNTER — Ambulatory Visit (INDEPENDENT_AMBULATORY_CARE_PROVIDER_SITE_OTHER): Payer: Self-pay | Admitting: General Practice

## 2018-04-21 VITALS — BP 112/65 | HR 102 | Wt 188.0 lb

## 2018-04-21 DIAGNOSIS — O24919 Unspecified diabetes mellitus in pregnancy, unspecified trimester: Secondary | ICD-10-CM

## 2018-04-21 DIAGNOSIS — O10913 Unspecified pre-existing hypertension complicating pregnancy, third trimester: Secondary | ICD-10-CM

## 2018-04-21 DIAGNOSIS — O10919 Unspecified pre-existing hypertension complicating pregnancy, unspecified trimester: Secondary | ICD-10-CM

## 2018-04-21 DIAGNOSIS — O0993 Supervision of high risk pregnancy, unspecified, third trimester: Secondary | ICD-10-CM

## 2018-04-21 DIAGNOSIS — O24913 Unspecified diabetes mellitus in pregnancy, third trimester: Secondary | ICD-10-CM

## 2018-04-21 LAB — POCT URINALYSIS DIP (DEVICE)
Bilirubin Urine: NEGATIVE
Glucose, UA: NEGATIVE mg/dL
HGB URINE DIPSTICK: NEGATIVE
Ketones, ur: NEGATIVE mg/dL
Leukocytes, UA: NEGATIVE
NITRITE: NEGATIVE
PH: 6.5 (ref 5.0–8.0)
Protein, ur: NEGATIVE mg/dL
Specific Gravity, Urine: 1.02 (ref 1.005–1.030)
UROBILINOGEN UA: 0.2 mg/dL (ref 0.0–1.0)

## 2018-04-21 NOTE — Progress Notes (Signed)
   PRENATAL VISIT NOTE  Subjective:  Michelle Christian is a 32 y.o. G3P2002 at 5361w5d being seen today for ongoing prenatal care. Patient is Spanish-speaking only, Spanish interpreter present for this encounter.  She is currently monitored for the following issues for this high-risk pregnancy and has Hypertension; Major depression, recurrent (HCC); Anxiety state, unspecified; Chronic hypertension during pregnancy, antepartum; Supervision of high risk pregnancy, antepartum, third trimester; History of postpartum depression, currently pregnant; Diabetes mellitus affecting pregnancy, antepartum; and Language barrier on their problem list.  Patient reports no complaints.  Contractions: Not present. Vag. Bleeding: None.  Movement: Present. Denies leaking of fluid.   The following portions of the patient's history were reviewed and updated as appropriate: allergies, current medications, past family history, past medical history, past social history, past surgical history and problem list. Problem list updated.  Objective:   Vitals:   04/21/18 0928  BP: 112/65  Pulse: (!) 102  Weight: 188 lb (85.3 kg)    Fetal Status: Fetal Heart Rate (bpm): NST Fundal Height: 33 cm Movement: Present     General:  Alert, oriented and cooperative. Patient is in no acute distress.  Skin: Skin is warm and dry. No rash noted.   Cardiovascular: Normal heart rate noted  Respiratory: Normal respiratory effort, no problems with respiration noted  Abdomen: Soft, gravid, appropriate for gestational age.  Pain/Pressure: Absent     Pelvic: Cervical exam deferred        Extremities: Normal range of motion.  Edema: None  Mental Status: Normal mood and affect. Normal behavior. Normal judgment and thought content.   NST performed today was reviewed and was found to be reactive. Subsequent BPP performed today was also reviewed and was found to be 10/10. AFI was also normal. Continue recommended antenatal testing and prenatal  care.  Assessment and Plan:  Pregnancy: G3P2002 at 7561w5d  1. Chronic hypertension during pregnancy, antepartum Stable BP, continue Aldomet and ASA. Getting weekly antenatal testing.  2. Diabetes mellitus affecting pregnancy, antepartum Stable CBGs, continue diet control  3. Supervision of high risk pregnancy, antepartum, third trimester Preterm labor symptoms and general obstetric precautions including but not limited to vaginal bleeding, contractions, leaking of fluid and fetal movement were reviewed in detail with the patient. Please refer to After Visit Summary for other counseling recommendations.  Return in about 1 week (around 04/28/2018) for OB visits and antenatal testing weekly as scheduled, OB Visit (HOB), NST, BPP.  Jaynie CollinsUgonna Indiya Izquierdo, MD

## 2018-04-21 NOTE — Progress Notes (Signed)
Pt informed that the ultrasound is considered a limited OB ultrasound and is not intended to be a complete ultrasound exam.  Patient also informed that the ultrasound is not being completed with the intent of assessing for fetal or placental anomalies or any pelvic abnormalities.  Explained that the purpose of today's ultrasound is to assess for  BPP, presentation and AFI.  Patient acknowledges the purpose of the exam and the limitations of the study.    

## 2018-04-21 NOTE — Patient Instructions (Signed)
Regrese a la clinica cuando tenga su cita. Si tiene problemas o preguntas, llama a la clinica o vaya a la [facility].    

## 2018-04-23 ENCOUNTER — Encounter: Payer: Self-pay | Admitting: Obstetrics and Gynecology

## 2018-04-23 MED FILL — METHYLDOPA 250 MG TABLET: 250 | 30 days supply | Qty: 60 | Fill #2 | Status: TO

## 2018-04-29 ENCOUNTER — Ambulatory Visit (INDEPENDENT_AMBULATORY_CARE_PROVIDER_SITE_OTHER): Payer: Self-pay | Admitting: *Deleted

## 2018-04-29 ENCOUNTER — Ambulatory Visit: Payer: Self-pay

## 2018-04-29 VITALS — BP 105/78 | HR 92 | Wt 191.8 lb

## 2018-04-29 DIAGNOSIS — O10913 Unspecified pre-existing hypertension complicating pregnancy, third trimester: Secondary | ICD-10-CM

## 2018-04-29 DIAGNOSIS — O10919 Unspecified pre-existing hypertension complicating pregnancy, unspecified trimester: Secondary | ICD-10-CM

## 2018-04-29 NOTE — Progress Notes (Signed)
Interpreter Marlynn PerkingMaria Elena present for encounter. Pt reports mild H/A yesterday - relieved with Tylenol. She denies visual disturbances.   Pt informed that the ultrasound is considered a limited OB ultrasound and is not intended to be a complete ultrasound exam.  Patient also informed that the ultrasound is not being completed with the intent of assessing for fetal or placental anomalies or any pelvic abnormalities.  Explained that the purpose of today's ultrasound is to assess for presentation, BPP and amniotic fluid volume.  Patient acknowledges the purpose of the exam and the limitations of the study.

## 2018-05-07 ENCOUNTER — Ambulatory Visit (INDEPENDENT_AMBULATORY_CARE_PROVIDER_SITE_OTHER): Payer: Self-pay | Admitting: Obstetrics & Gynecology

## 2018-05-07 ENCOUNTER — Ambulatory Visit (INDEPENDENT_AMBULATORY_CARE_PROVIDER_SITE_OTHER): Payer: Self-pay | Admitting: *Deleted

## 2018-05-07 ENCOUNTER — Ambulatory Visit: Payer: Self-pay

## 2018-05-07 VITALS — BP 120/80 | HR 108 | Wt 191.7 lb

## 2018-05-07 DIAGNOSIS — O10919 Unspecified pre-existing hypertension complicating pregnancy, unspecified trimester: Secondary | ICD-10-CM

## 2018-05-07 DIAGNOSIS — O24919 Unspecified diabetes mellitus in pregnancy, unspecified trimester: Secondary | ICD-10-CM

## 2018-05-07 DIAGNOSIS — O0993 Supervision of high risk pregnancy, unspecified, third trimester: Secondary | ICD-10-CM

## 2018-05-07 NOTE — Progress Notes (Signed)

## 2018-05-07 NOTE — Progress Notes (Signed)
US for growth and BPP scheduled on 8/1 (38 wks)

## 2018-05-07 NOTE — Progress Notes (Signed)
   PRENATAL VISIT NOTE  Subjective:  Michelle Christian is a 32 y.o. G3P2002 at 8673w0d being seen today for ongoing prenatal care. Patient is Spanish-speaking only, Spanish interpreter present for this encounter.  She is currently monitored for the following issues for this high-risk pregnancy and has Hypertension; Major depression, recurrent (HCC); Anxiety state, unspecified; Chronic hypertension during pregnancy, antepartum; Supervision of high risk pregnancy, antepartum, third trimester; History of postpartum depression, currently pregnant; Diabetes mellitus affecting pregnancy, antepartum; and Language barrier on their problem list.  Patient reports no complaints.  Contractions: Not present. Vag. Bleeding: None.  Movement: Present. Denies leaking of fluid.   The following portions of the patient's history were reviewed and updated as appropriate: allergies, current medications, past family history, past medical history, past social history, past surgical history and problem list. Problem list updated.  Objective:   Vitals:   05/07/18 1344  BP: 120/80  Pulse: (!) 108  Weight: 191 lb 11.2 oz (87 kg)    Fetal Status: Fetal Heart Rate (bpm): NST Fundal Height: 36 cm Movement: Present     General:  Alert, oriented and cooperative. Patient is in no acute distress.  Skin: Skin is warm and dry. No rash noted.   Cardiovascular: Normal heart rate noted  Respiratory: Normal respiratory effort, no problems with respiration noted  Abdomen: Soft, gravid, appropriate for gestational age.  Pain/Pressure: Present     Pelvic: Cervical exam deferred        Extremities: Normal range of motion.  Edema: None  Mental Status: Normal mood and affect. Normal behavior. Normal judgment and thought content.    NST performed today was reviewed and was found to be reactive. Subsequent BPP performed today was also reviewed and was found to be 10/10. AFI was also normal. Continue recommended antenatal testing and prenatal  care.  Assessment and Plan:  Pregnancy: G3P2002 at 7473w0d  1. Diabetes mellitus affecting pregnancy, antepartum CBGs are within range. Will continue diet and exercise. - US MFM OB FOLLOW UP; Future - US MFM FETAL BPP WO NON STRESS; Future  2. Chronic hypertension during pregnancy, antepartum Stable BP on BASA and Aldomet. - US MFM OB FOLLOW UP; Future - US MFM FETAL BPP WO NON STRESS; Future  3. Supervision of high risk pregnancy, antepartum, third trimester Preterm labor symptoms and general obstetric precautions including but not limited to vaginal bleeding, contractions, leaking of fluid and fetal movement were reviewed in detail with the patient. Please refer to After Visit Summary for other counseling recommendations.  Return in about 1 week (around 05/14/2018) for as scheduled; 8/1 Needs NSTonly and HOB after US.  Future Appointments  Date Time Provider Department Center  05/07/2018  2:15 PM Madoline Bhatt, Jethro BastosUgonna A, MD [facility]  05/14/2018 10:15 AM [facility] NST [facility]  05/14/2018 11:15 AM Constant, Gigi GinPeggy, MD [facility]  05/21/2018 10:15 AM [facility] NST [facility]  05/21/2018 11:15 AM Allie Bossierove, Myra C, MD [facility]  05/28/2018  8:00 AM [facility] US 3 [facility]    Jaynie CollinsUgonna Nyra Anspaugh, MD

## 2018-05-07 NOTE — Patient Instructions (Signed)
Regrese a la clinica cuando tenga su cita. Si tiene problemas o preguntas, llama a la clinica o vaya a la [facility].    

## 2018-05-14 ENCOUNTER — Ambulatory Visit (INDEPENDENT_AMBULATORY_CARE_PROVIDER_SITE_OTHER): Payer: Self-pay | Admitting: Obstetrics and Gynecology

## 2018-05-14 ENCOUNTER — Ambulatory Visit (INDEPENDENT_AMBULATORY_CARE_PROVIDER_SITE_OTHER): Payer: Self-pay | Admitting: *Deleted

## 2018-05-14 ENCOUNTER — Encounter: Payer: Self-pay | Admitting: Obstetrics and Gynecology

## 2018-05-14 ENCOUNTER — Ambulatory Visit: Payer: Self-pay

## 2018-05-14 VITALS — BP 126/80 | HR 86 | Wt 194.6 lb

## 2018-05-14 DIAGNOSIS — O10919 Unspecified pre-existing hypertension complicating pregnancy, unspecified trimester: Secondary | ICD-10-CM

## 2018-05-14 DIAGNOSIS — O24913 Unspecified diabetes mellitus in pregnancy, third trimester: Secondary | ICD-10-CM

## 2018-05-14 DIAGNOSIS — O24919 Unspecified diabetes mellitus in pregnancy, unspecified trimester: Secondary | ICD-10-CM

## 2018-05-14 DIAGNOSIS — O0993 Supervision of high risk pregnancy, unspecified, third trimester: Secondary | ICD-10-CM

## 2018-05-14 DIAGNOSIS — Z113 Encounter for screening for infections with a predominantly sexual mode of transmission: Secondary | ICD-10-CM

## 2018-05-14 NOTE — Progress Notes (Signed)

## 2018-05-14 NOTE — Progress Notes (Signed)
   PRENATAL VISIT NOTE  Subjective:  Michelle Christian is a [age] y.o. G3P2002 at 5980w0d being seen today for ongoing prenatal care.  She is currently monitored for the following issues for this high-risk pregnancy and has Hypertension; Major depression, recurrent (HCC); Anxiety state, unspecified; Chronic hypertension during pregnancy, antepartum; Supervision of high risk pregnancy, antepartum, third trimester; History of postpartum depression, currently pregnant; Diabetes mellitus affecting pregnancy, antepartum; and Language barrier on their problem list.  Patient reports no complaints.  Contractions: Not present. Vag. Bleeding: None.  Movement: Present. Denies leaking of fluid.   The following portions of the patient's history were reviewed and updated as appropriate: allergies, current medications, past family history, past medical history, past social history, past surgical history and problem list. Problem list updated.  Objective:   Vitals:   05/14/18 1109  BP: 126/80  Pulse: 86  Weight: 194 lb 9.6 oz (88.3 kg)    Fetal Status: Fetal Heart Rate (bpm): NST   Movement: Present     General:  Alert, oriented and cooperative. Patient is in no acute distress.  Skin: Skin is warm and dry. No rash noted.   Cardiovascular: Normal heart rate noted  Respiratory: Normal respiratory effort, no problems with respiration noted  Abdomen: Soft, gravid, appropriate for gestational age.  Pain/Pressure: Present     Pelvic: Cervical exam performed        Extremities: Normal range of motion.  Edema: Trace  Mental Status: Normal mood and affect. Normal behavior. Normal judgment and thought content.   Assessment and Plan:  Pregnancy: G3P2002 at 5280w0d  1. Supervision of high risk pregnancy, antepartum, third trimester Patient is doing well without complaints Cultures today  2. Chronic hypertension during pregnancy, antepartum Normotensive NST reviewed and reactive Continue antenatal testing  3.  Diabetes mellitus affecting pregnancy, antepartum CBGs reviewed and all within range  Continue diet control  Preterm labor symptoms and general obstetric precautions including but not limited to vaginal bleeding, contractions, leaking of fluid and fetal movement were reviewed in detail with the patient. Please refer to After Visit Summary for other counseling recommendations.  No follow-ups on file.  Future Appointments  Date Time Provider Department Center  05/21/2018 10:15 AM [facility] NST [facility]OC  05/21/2018 11:15 AM Allie Bossierove, Myra C, MD [facility]OC  05/28/2018  8:00 AM [facility] US 3 [facility]US [facility]-US  05/28/2018  9:15 AM [facility] NST [facility]OC  05/28/2018 10:15 AM Hermina StaggersErvin, Michael L, MD [facility] WOC  06/04/2018  1:15 PM [facility] NST [facility] WOC  06/04/2018  2:15 PM Levie HeritageStinson, Jacob J, DO [facility] WOC  06/11/2018  1:15 PM [facility] NST [facility] WOC  06/11/2018  2:15 PM Stinson, Rhona RaiderJacob J, DO [facility] WOC    Catalina AntiguaPeggy Kham Zuckerman, MD

## 2018-05-14 NOTE — Progress Notes (Signed)
Live interpreter present for encounter.  Pt states she slipped (did not fall) 2 days ago and is having some muscular discomfort of her Rt thigh - advised to take Tylenol if desired and may use heating pad or ice packs.

## 2018-05-15 LAB — GC/CHLAMYDIA PROBE AMP (~~LOC~~) NOT AT ARMC
Chlamydia: NEGATIVE
Neisseria Gonorrhea: NEGATIVE

## 2018-05-18 LAB — CULTURE, BETA STREP (GROUP B ONLY): Strep Gp B Culture: NEGATIVE

## 2018-05-21 ENCOUNTER — Telehealth (HOSPITAL_COMMUNITY): Payer: Self-pay | Admitting: *Deleted

## 2018-05-21 ENCOUNTER — Ambulatory Visit: Payer: Self-pay

## 2018-05-21 ENCOUNTER — Ambulatory Visit (INDEPENDENT_AMBULATORY_CARE_PROVIDER_SITE_OTHER): Payer: Self-pay | Admitting: *Deleted

## 2018-05-21 ENCOUNTER — Ambulatory Visit (INDEPENDENT_AMBULATORY_CARE_PROVIDER_SITE_OTHER): Payer: Self-pay | Admitting: Obstetrics & Gynecology

## 2018-05-21 VITALS — BP 110/81 | HR 115 | Wt 194.4 lb

## 2018-05-21 DIAGNOSIS — O24919 Unspecified diabetes mellitus in pregnancy, unspecified trimester: Secondary | ICD-10-CM

## 2018-05-21 DIAGNOSIS — O10919 Unspecified pre-existing hypertension complicating pregnancy, unspecified trimester: Secondary | ICD-10-CM

## 2018-05-21 DIAGNOSIS — O0993 Supervision of high risk pregnancy, unspecified, third trimester: Secondary | ICD-10-CM

## 2018-05-21 DIAGNOSIS — O99210 Obesity complicating pregnancy, unspecified trimester: Secondary | ICD-10-CM

## 2018-05-21 DIAGNOSIS — Z789 Other specified health status: Secondary | ICD-10-CM

## 2018-05-21 LAB — POCT URINALYSIS DIP (DEVICE)
Bilirubin Urine: NEGATIVE
GLUCOSE, UA: NEGATIVE mg/dL
Hgb urine dipstick: NEGATIVE
Ketones, ur: NEGATIVE mg/dL
Leukocytes, UA: NEGATIVE
NITRITE: NEGATIVE
Protein, ur: NEGATIVE mg/dL
Specific Gravity, Urine: 1.015 (ref 1.005–1.030)
UROBILINOGEN UA: 0.2 mg/dL (ref 0.0–1.0)
pH: 7 (ref 5.0–8.0)

## 2018-05-21 NOTE — Progress Notes (Signed)
   PRENATAL VISIT NOTE  Subjective:  Michelle Christian is a 32 y.o. G3P2002 at 2371w0d being seen today for ongoing prenatal care.  She is currently monitored for the following issues for this high-risk pregnancy and has Hypertension; Major depression, recurrent (HCC); Anxiety state, unspecified; Chronic hypertension during pregnancy, antepartum; Supervision of high risk pregnancy, antepartum, third trimester; History of postpartum depression, currently pregnant; Diabetes mellitus affecting pregnancy, antepartum; Language barrier; and Obesity in pregnancy on their problem list.  Patient reports no complaints.  Contractions: Not present. Vag. Bleeding: None.  Movement: Present. Denies leaking of fluid.   The following portions of the patient's history were reviewed and updated as appropriate: allergies, current medications, past family history, past medical history, past social history, past surgical history and problem list. Problem list updated.  Objective:   Vitals:   05/21/18 1026  BP: 110/81  Pulse: (!) 115  Weight: 194 lb 6.4 oz (88.2 kg)    Fetal Status: Fetal Heart Rate (bpm): NST   Movement: Present     General:  Alert, oriented and cooperative. Patient is in no acute distress.  Skin: Skin is warm and dry. No rash noted.   Cardiovascular: Normal heart rate noted  Respiratory: Normal respiratory effort, no problems with respiration noted  Abdomen: Soft, gravid, appropriate for gestational age.  Pain/Pressure: Present     Pelvic: Cervical exam deferred        Extremities: Normal range of motion.  Edema: Trace  Mental Status: Normal mood and affect. Normal behavior. Normal judgment and thought content.   Assessment and Plan:  Pregnancy: G3P2002 at 971w0d  1. Chronic hypertension during pregnancy, antepartum - on aldomet - IOL scheduled for 39 weeks  2. Diabetes mellitus affecting pregnancy, antepartum - on no meds, her sugars are all less than 100. I told her to only check 2 times  per day  3. Language barrier - live interpretor present for exam  4. Supervision of high risk pregnancy, antepartum, third trimester - continue weekly BPP - MFM u/s follow up on 05-28-18  5. Obesity in pregnancy   Preterm labor symptoms and general obstetric precautions including but not limited to vaginal bleeding, contractions, leaking of fluid and fetal movement were reviewed in detail with the patient. Please refer to After Visit Summary for other counseling recommendations.  Return in about 1 week (around 05/28/2018) for as scheduled.  Future Appointments  Date Time Provider Department Center  05/21/2018 11:15 AM Allie Bossierove, Illyria Sobocinski C, MD [facility] WOC  05/28/2018  8:00 AM [facility] US 3 WH-M[facility]US MFC-US  05/28/2018  9:15 AM [facility] NST [facility] WOC  05/28/2018 10:15 AM Hermina StaggersErvin, Michael L, MD [facility] WOC  06/04/2018  1:15 PM [facility]OC-[facility]A NST [facility]A WOC  06/04/2018  2:15 PM Levie HeritageStinson, Jacob J, DO [facility] WOC  06/11/2018  1:15 PM [facility] NST [facility] WOC  06/11/2018  2:15 PM Stinson, Rhona RaiderJacob J, DO [facility] WOC    Allie BossierMyra C Azani Brogdon, MD

## 2018-05-21 NOTE — Progress Notes (Signed)
Interpreter Maretta LosBlanca Lindner present for encounter.  US for growth/BPP scheduled on 8/1

## 2018-05-21 NOTE — Progress Notes (Signed)

## 2018-05-21 NOTE — Telephone Encounter (Signed)
Interpreter number [phone]

## 2018-05-22 ENCOUNTER — Telehealth (HOSPITAL_COMMUNITY): Payer: Self-pay | Admitting: *Deleted

## 2018-05-22 ENCOUNTER — Encounter (HOSPITAL_COMMUNITY): Payer: Self-pay | Admitting: *Deleted

## 2018-05-22 NOTE — Progress Notes (Signed)
I have reviewed this chart and agree with the RN/CMA assessment and management.    Kensley Lares C Mamie Hundertmark, MD, FACOG Attending Physician, [facility]  

## 2018-05-22 NOTE — Telephone Encounter (Signed)
Preadmission screen Interpreter number [phone]

## 2018-05-26 MED FILL — METHYLDOPA 250 MG TABLET: 250 | 10 days supply | Qty: 20 | Fill #0

## 2018-05-28 ENCOUNTER — Ambulatory Visit (INDEPENDENT_AMBULATORY_CARE_PROVIDER_SITE_OTHER): Payer: Self-pay | Admitting: *Deleted

## 2018-05-28 ENCOUNTER — Other Ambulatory Visit: Payer: Self-pay | Admitting: Obstetrics & Gynecology

## 2018-05-28 ENCOUNTER — Ambulatory Visit (INDEPENDENT_AMBULATORY_CARE_PROVIDER_SITE_OTHER): Payer: Self-pay | Admitting: Obstetrics and Gynecology

## 2018-05-28 ENCOUNTER — Encounter (HOSPITAL_COMMUNITY): Payer: Self-pay

## 2018-05-28 ENCOUNTER — Ambulatory Visit (HOSPITAL_COMMUNITY)
Admission: RE | Admit: 2018-05-28 | Discharge: 2018-05-28 | Disposition: A | Discharge status: Home / Self Care | Payer: Self-pay | Source: Ambulatory Visit | Attending: Obstetrics & Gynecology | Admitting: Obstetrics & Gynecology

## 2018-05-28 ENCOUNTER — Encounter: Payer: Self-pay | Admitting: Obstetrics and Gynecology

## 2018-05-28 VITALS — BP 131/92 | HR 86 | Wt 195.6 lb

## 2018-05-28 DIAGNOSIS — O10913 Unspecified pre-existing hypertension complicating pregnancy, third trimester: Secondary | ICD-10-CM

## 2018-05-28 DIAGNOSIS — O24919 Unspecified diabetes mellitus in pregnancy, unspecified trimester: Secondary | ICD-10-CM

## 2018-05-28 DIAGNOSIS — Z789 Other specified health status: Secondary | ICD-10-CM

## 2018-05-28 DIAGNOSIS — O99213 Obesity complicating pregnancy, third trimester: Secondary | ICD-10-CM

## 2018-05-28 DIAGNOSIS — O10919 Unspecified pre-existing hypertension complicating pregnancy, unspecified trimester: Secondary | ICD-10-CM

## 2018-05-28 DIAGNOSIS — Z3A38 38 weeks gestation of pregnancy: Secondary | ICD-10-CM | POA: Insufficient documentation

## 2018-05-28 DIAGNOSIS — O10013 Pre-existing essential hypertension complicating pregnancy, third trimester: Secondary | ICD-10-CM

## 2018-05-28 DIAGNOSIS — Z362 Encounter for other antenatal screening follow-up: Secondary | ICD-10-CM

## 2018-05-28 DIAGNOSIS — O24913 Unspecified diabetes mellitus in pregnancy, third trimester: Secondary | ICD-10-CM

## 2018-05-28 DIAGNOSIS — O24113 Pre-existing diabetes mellitus, type 2, in pregnancy, third trimester: Secondary | ICD-10-CM | POA: Insufficient documentation

## 2018-05-28 DIAGNOSIS — O0990 Supervision of high risk pregnancy, unspecified, unspecified trimester: Secondary | ICD-10-CM

## 2018-05-28 DIAGNOSIS — O0993 Supervision of high risk pregnancy, unspecified, third trimester: Secondary | ICD-10-CM

## 2018-05-28 LAB — POCT URINALYSIS DIP (DEVICE)
Bilirubin Urine: NEGATIVE
GLUCOSE, UA: NEGATIVE mg/dL
Hgb urine dipstick: NEGATIVE
Ketones, ur: NEGATIVE mg/dL
Leukocytes, UA: NEGATIVE
Nitrite: NEGATIVE
Protein, ur: NEGATIVE mg/dL
SPECIFIC GRAVITY, URINE: 1.01 (ref 1.005–1.030)
Urobilinogen, UA: 0.2 mg/dL (ref 0.0–1.0)
pH: 7 (ref 5.0–8.0)

## 2018-05-28 NOTE — Progress Notes (Signed)
Subjective:  Michelle Christian is a [age] y.o. G3P2002 at 2545w0d being seen today for ongoing prenatal care.  She is currently monitored for the following issues for this high-risk pregnancy and has Hypertension; Major depression, recurrent (HCC); Anxiety state, unspecified; Chronic hypertension during pregnancy, antepartum; Supervision of high risk pregnancy, antepartum, third trimester; History of postpartum depression, currently pregnant; Diabetes mellitus affecting pregnancy, antepartum; Language barrier; and Obesity in pregnancy on their problem list.  Patient reports no complaints.  Contractions: Not present. Vag. Bleeding: None.  Movement: Present. Denies leaking of fluid.   The following portions of the patient's history were reviewed and updated as appropriate: allergies, current medications, past family history, past medical history, past social history, past surgical history and problem list. Problem list updated.  Objective:   Vitals:   05/28/18 0926  BP: (!) 131/92  Pulse: 86  Weight: 195 lb 9.6 oz (88.7 kg)    Fetal Status: Fetal Heart Rate (bpm): NST   Movement: Present     General:  Alert, oriented and cooperative. Patient is in no acute distress.  Skin: Skin is warm and dry. No rash noted.   Cardiovascular: Normal heart rate noted  Respiratory: Normal respiratory effort, no problems with respiration noted  Abdomen: Soft, gravid, appropriate for gestational age. Pain/Pressure: Present     Pelvic:  Cervical exam deferred        Extremities: Normal range of motion.     Mental Status: Normal mood and affect. Normal behavior. Normal judgment and thought content.   Urinalysis:      Assessment and Plan:  Pregnancy: G3P2002 at 445w0d  1. Supervision of high risk pregnancy, antepartum Stable Labor precautions  2. Chronic hypertension during pregnancy, antepartum BP stable Continue with Aldomet and BASA BPP 10/10 today IOL next week  3. Diabetes mellitus affecting pregnancy,  antepartum CBG's in goal range on diet BPP/10/10 today IOL next week  4. Language barrier Interrupter services used during today's visit  Term labor symptoms and general obstetric precautions including but not limited to vaginal bleeding, contractions, leaking of fluid and fetal movement were reviewed in detail with the patient. Please refer to After Visit Summary for other counseling recommendations.  Return in about 7 weeks (around 07/16/2018) for PP visit w/2hr GTT.   IOL on 8/8.   Hermina StaggersErvin, Finesse Fielder L, MD

## 2018-05-28 NOTE — Patient Instructions (Signed)
 Lactancia materna Breastfeeding Decidir amamantar es una de las mejores elecciones que puede hacer por usted y su beb. Un cambio en las hormonas durante el embarazo hace que las mamas produzcan leche materna en las glndulas productoras de leche. Las hormonas impiden que la leche materna sea liberada antes del nacimiento del beb. Adems, impulsan el flujo de leche luego del nacimiento. Una vez que ha comenzado a amamantar, pensar en el beb, as como la succin o el llanto, pueden estimular la liberacin de leche de las glndulas productoras de leche. Los beneficios de amamantar Las investigaciones demuestran que la lactancia materna ofrece muchos beneficios de salud para bebs y madres. Adems, ofrece una forma gratuita y conveniente de alimentar al beb. Para el beb  La primera leche (calostro) ayuda a mejorar el funcionamiento del aparato digestivo del beb.  Las clulas especiales de la leche (anticuerpos) ayudan a combatir las infecciones en el beb.  Los bebs que se alimentan con leche materna tambin tienen menos probabilidades de tener asma, alergias, obesidad o diabetes de tipo 2. Adems, tienen menor riesgo de sufrir el sndrome de muerte sbita del lactante (SMSL).  Los nutrientes de la leche materna son mejores para satisfacer las necesidades del beb en comparacin con la leche maternizada.  La leche materna mejora el desarrollo cerebral del beb. Para usted  La lactancia materna favorece el desarrollo de un vnculo muy especial entre la madre y el beb.  Es conveniente. La leche materna es econmica y siempre est disponible a la temperatura correcta.  La lactancia materna ayuda a quemar caloras. Le ayuda a perder el peso ganado durante el embarazo.  Hace que el tero vuelva al tamao que tena antes del embarazo ms rpido. Adems, disminuye el sangrado (loquios) despus del parto.  La lactancia materna contribuye a reducir el riesgo de tener diabetes de tipo 2,  osteoporosis, artritis reumatoide, enfermedades cardiovasculares y cncer de mama, ovario, tero y endometrio en el futuro. Informacin bsica sobre la lactancia Comienzo de la lactancia  Encuentre un lugar cmodo para sentarse o acostarse, con un buen respaldo para el cuello y la espalda.  Coloque una almohada o una manta enrollada debajo del beb para acomodarlo a la altura de la mama (si est sentada). Las almohadas para amamantar se han diseado especialmente a fin de servir de apoyo para los brazos y el beb mientras amamanta.  Asegrese de que la barriga del beb (abdomen) est frente a la suya.  Masajee suavemente la mama. Con las yemas de los dedos, masajee los bordes exteriores de la mama hacia adentro, en direccin al pezn. Esto estimula el flujo de leche. Si la leche fluye lentamente, es posible que deba continuar con este movimiento durante la lactancia.  Sostenga la mama con 4 dedos por debajo y el pulgar por arriba del pezn (forme la letra "C" con la mano). Asegrese de que los dedos se encuentren lejos del pezn y de la boca del beb.  Empuje suavemente los labios del beb con el pezn o con el dedo.  Cuando la boca del beb se abra lo suficiente, acrquelo rpidamente a la mama e introduzca todo el pezn y la arola, tanto como sea posible, dentro de la boca del beb. La arola es la zona de color que rodea al pezn. ? Debe haber ms arola visible por arriba del labio superior del beb que por debajo del labio inferior. ? Los labios del beb deben estar abiertos y extendidos hacia afuera (evertidos) para asegurar que   el beb se prenda de forma adecuada y cmoda. ? La lengua del beb debe estar entre la enca inferior y la mama.  Asegrese de que la boca del beb est en la posicin correcta alrededor del pezn (prendido). Los labios del beb deben crear un sello sobre la mama y estar doblados hacia afuera (invertidos).  Es comn que el beb succione durante 2 a 3 minutos  para que comience el flujo de leche materna. Cmo debe prenderse Es muy importante que le ensee al beb cmo prenderse adecuadamente a la mama. Si el beb no se prende adecuadamente, puede causar dolor en los pezones, reducir la produccin de leche materna y hacer que el beb tenga un escaso aumento de peso. Adems, si el beb no se prende adecuadamente al pezn, puede tragar aire durante la alimentacin. Esto puede causarle molestias al beb. Hacer eructar al beb al cambiar de mama puede ayudarlo a liberar el aire. Sin embargo, ensearle al beb cmo prenderse a la mama adecuadamente es la mejor manera de evitar que se sienta molesto por tragar aire mientras se alimenta. Signos de que el beb se ha prendido adecuadamente al pezn  Tironea o succiona de modo silencioso, sin causarle dolor. Los labios del beb deben estar extendidos hacia afuera (evertidos).  Se escucha que traga cada 3 o 4 succiones una vez que la leche ha comenzado a fluir (despus de que se produzca el reflejo de eyeccin de la leche).  Hay movimientos musculares por arriba y por delante de sus odos al succionar.  Signos de que el beb no se ha prendido adecuadamente al pezn  Hace ruidos de succin o de chasquido mientras se alimenta.  Siente dolor en los pezones.  Si cree que el beb no se prendi correctamente, deslice el dedo en la comisura de la boca y colquelo entre las encas del beb para interrumpir la succin. Intente volver a comenzar a amamantar. Signos de lactancia materna exitosa Signos del beb  El beb disminuir gradualmente el nmero de succiones o dejar de succionar por completo.  El beb se quedar dormido.  El cuerpo del beb se relajar.  El beb retendr una pequea cantidad de leche en la boca.  El beb se desprender solo del pecho.  Signos que presenta usted  Las mamas han aumentado la firmeza, el peso y el tamao 1 a 3 horas despus de amamantar.  Estn ms blandas inmediatamente  despus de amamantar.  Se producen un aumento del volumen de leche y un cambio en su consistencia y color hacia el quinto da de lactancia.  Los pezones no duelen, no estn agrietados ni sangran.  Signos de que su beb recibe la cantidad de leche suficiente  Mojar por lo menos 1 o 2paales durante las primeras 24horas despus del nacimiento.  Mojar por lo menos 5 o 6paales cada 24horas durante la primera semana despus del nacimiento. La orina debe ser clara o de color amarillo plido a los 5das de vida.  Mojar entre 6 y 8paales cada 24horas a medida que el beb sigue creciendo y desarrollndose.  Defeca por lo menos 3 veces en 24 horas a los 5 das de vida. Las heces deben ser blandas y amarillentas.  Defeca por lo menos 3 veces en 24 horas a los 7 das de vida. Las heces deben ser grumosas y amarillentas.  No registra una prdida de peso mayor al 10% del peso al nacer durante los primeros 3 das de vida.  Aumenta de peso un   promedio de 4 a 7onzas (113 a 198g) por semana despus de los 4 das de vida.  Aumenta de peso, diariamente, de manera uniforme a partir de los 5 das de vida, sin registrar prdida de peso despus de las 2semanas de vida. Despus de alimentarse, es posible que el beb regurgite una pequea cantidad de leche. Esto es normal. Frecuencia y duracin de la lactancia El amamantamiento frecuente la ayudar a producir ms leche y puede prevenir dolores en los pezones y las mamas extremadamente llenas (congestin mamaria). Alimente al beb cuando muestre signos de hambre o si siente la necesidad de reducir la congestin de las mamas. Esto se denomina "lactancia a demanda". Las seales de que el beb tiene hambre incluyen las siguientes:  Aumento del estado de alerta, actividad o inquietud.  Mueve la cabeza de un lado a otro.  Abre la boca cuando se le toca la mejilla o la comisura de la boca (reflejo de bsqueda).  Aumenta las vocalizaciones, tales como  sonidos de succin, se relame los labios, emite arrullos, suspiros o chirridos.  Mueve la mano hacia la boca y se chupa los dedos o las manos.  Est molesto o llora.  Evite el uso del chupete en las primeras 4 a 6 semanas despus del nacimiento del beb. Despus de este perodo, podr usar un chupete. Las investigaciones demostraron que el uso del chupete durante el primer ao de vida del beb disminuye el riesgo de tener el sndrome de muerte sbita del lactante (SMSL). Permita que el nio se alimente en cada mama todo lo que desee. Cuando el beb se desprende o se queda dormido mientras se est alimentando de la primera mama, ofrzcale la segunda. Debido a que, con frecuencia, los recin nacidos estn somnolientos las primeras semanas de vida, es posible que deba despertar al beb para alimentarlo. Los horarios de lactancia varan de un beb a otro. Sin embargo, las siguientes reglas pueden servir como gua para ayudarla a garantizar que el beb se alimenta adecuadamente:  Se puede amamantar a los recin nacidos (bebs de 4 semanas o menos de vida) cada 1 a 3 horas.  No deben transcurrir ms de 3 horas durante el da o 5 horas durante la noche sin que se amamante a los recin nacidos.  Debe amamantar al beb un mnimo de 8 veces en un perodo de 24 horas.  Extraccin de leche materna La extraccin y el almacenamiento de la leche materna le permiten asegurarse de que el beb se alimente exclusivamente de su leche materna, aun en momentos en los que no puede amamantar. Esto tiene especial importancia si debe regresar al trabajo en el perodo en que an est amamantando o si no puede estar presente en los momentos en que el beb debe alimentarse. Su asesor en lactancia puede ayudarla a encontrar un mtodo de extraccin que funcione mejor para usted y orientarla sobre cunto tiempo es seguro almacenar leche materna. Cmo cuidar las mamas durante la lactancia Los pezones pueden secarse, agrietarse y  doler durante la lactancia. Las siguientes recomendaciones pueden ayudarla a mantener las mamas humectadas y sanas:  Evite usar jabn en los pezones.  Use un sostn de soporte diseado especialmente para la lactancia materna. Evite usar sostenes con aro o sostenes muy ajustados (sostenes deportivos).  Seque al aire sus pezones durante 3 a 4minutos despus de amamantar al beb.  Utilice solo apsitos de algodn en el sostn para absorber las prdidas de leche. La prdida de un poco de leche materna   entre las tomas es normal.  Utilice lanolina sobre los pezones luego de amamantar. La lanolina ayuda a mantener la humedad normal de la piel. La lanolina pura no es perjudicial (no es txica) para el beb. Adems, puede extraer manualmente algunas gotas de leche materna y masajear suavemente esa leche sobre los pezones para que la leche se seque al aire.  Durante las primeras semanas despus del nacimiento, algunas mujeres experimentan congestin mamaria. La congestin mamaria puede hacer que sienta las mamas pesadas, calientes y sensibles al tacto. El pico de la congestin mamaria ocurre en el plazo de los 3 a 5 das despus del parto. Las siguientes recomendaciones pueden ayudarla a aliviar la congestin mamaria:  Vace por completo las mamas al amamantar o extraer leche. Puede aplicar calor hmedo en las mamas (en la ducha o con toallas hmedas para manos) antes de amamantar o extraer leche. Esto aumenta la circulacin y ayuda a que la leche fluya. Si el beb no vaca por completo las mamas cuando lo amamanta, extraiga la leche restante despus de que haya finalizado.  Aplique compresas de hielo sobre las mamas inmediatamente despus de amamantar o extraer leche, a menos que le resulte demasiado incmodo. Haga lo siguiente: ? Ponga el hielo en una bolsa plstica. ? Coloque una toalla entre la piel y la bolsa de hielo. ? Coloque el hielo durante 20minutos, 2 o 3veces por da.  Asegrese de que el  beb est prendido y se encuentre en la posicin correcta mientras lo alimenta.  Si la congestin mamaria persiste luego de 48 horas o despus de seguir estas recomendaciones, comunquese con su mdico o un asesor en lactancia. Recomendaciones de salud general durante la lactancia  Consuma 3 comidas y 3 colaciones saludables todos los das. Las madres bien alimentadas que amamantan necesitan entre 450 y 500 caloras adicionales por da. Puede cumplir con este requisito al aumentar la cantidad de una dieta equilibrada que realice.  Beba suficiente agua para mantener la orina clara o de color amarillo plido.  Descanse con frecuencia, reljese y siga tomando sus vitaminas prenatales para prevenir la fatiga, el estrs y los niveles bajos de vitaminas y minerales en el cuerpo (deficiencias de nutrientes).  No consuma ningn producto que contenga nicotina o tabaco, como cigarrillos y cigarrillos electrnicos. El beb puede verse afectado por las sustancias qumicas de los cigarrillos que pasan a la leche materna y por la exposicin al humo ambiental del tabaco. Si necesita ayuda para dejar de fumar, consulte al mdico.  Evite el consumo de alcohol.  No consuma drogas ilegales o marihuana.  Antes de usar cualquier medicamento, hable con el mdico. Estos incluyen medicamentos recetados y de venta libre, como tambin vitaminas y suplementos a base de hierbas. Algunos medicamentos, que pueden ser perjudiciales para el beb, pueden pasar a travs de la leche materna.  Puede quedar embarazada durante la lactancia. Si se desea un mtodo anticonceptivo, consulte al mdico sobre cules son las opciones seguras durante la lactancia. Dnde encontrar ms informacin: Liga internacional La Leche: www.llli.org. Comunquese con un mdico si:  Siente que quiere dejar de amamantar o se siente frustrada con la lactancia.  Sus pezones estn agrietados o sangran.  Sus mamas estn irritadas, sensibles o  calientes.  Tiene los siguientes sntomas: ? Dolor en las mamas o en los pezones. ? Un rea hinchada en cualquiera de las mamas. ? Fiebre o escalofros. ? Nuseas o vmitos. ? Drenaje de otro lquido distinto de la leche materna desde los pezones.    mamas no se llenan antes de amamantar al beb para el quinto da despus del [place].  Se siente triste y deprimida.  El beb: ? Est demasiado somnoliento como para comer bien. ? Tiene problemas para dormir. ? Tiene ms de 1 semana de vida y [employer] de 6 paales en un periodo de 24 horas. ? No ha aumentado de [place] a los [address] de [address]  El beb defeca menos de 3 veces en 24 horas.  La piel del beb o las partes blancas de los ojos se vuelven amarillentas. Solicite ayuda de inmediato si:  El beb est muy cansado Retail buyer) y no se quiere despertar para comer.  Le sube la fiebre sin causa. Resumen  La lactancia materna ofrece muchos beneficios de salud para bebs y [place].  Intente amamantar a su beb cuando muestre signos tempranos de hambre.  Haga cosquillas o empuje suavemente los labios del beb con el dedo o el pezn para lograr que el beb abra la boca. Acerque el beb a la mama. Asegrese de que la mayor parte de la arola se encuentre dentro de la boca del beb. Ofrzcale una mama y haga eructar al beb antes de pasar a la otra.  Hable con su mdico o asesor en lactancia si tiene dudas o problemas con la lactancia. Esta informacin no tiene Theme park manager el consejo del mdico. Asegrese de hacerle al mdico cualquier pregunta que tenga. Document Released: 10/14/2005 Document Revised: 02/03/2017 Document Reviewed: 02/03/2017 Elsevier Interactive Patient Education  2018 [employer]. Parto vaginal Vaginal Delivery Parto vaginal significa que usted dar a luz empujando al beb fuera del canal del parto (vagina). Un equipo de proveedores de atencin mdica la ayudar antes, durante y despus del parto vaginal. Las  experiencias de los nacimientos son nicas para todas las mujeres y Sports administrator y las experiencias de nacimiento varan segn dnde elija dar a luz. Qu debo hacer a fin de prepararme para el nacimiento de mi beb? Antes del nacimiento de su beb, es importante que hable con su mdico sobre lo siguiente:  Sus preferencias en cuanto al Aleen Campi de parto y [place]. Estas pueden incluir lo siguiente: ? Science writer. ? Cmo Human resources officer. Esto podra incluir tcnicas de alivio del dolor no mdicas o medicamentos inyectables para Engineer, materials como la analgesia epidural. ? Cmo se los controlar a usted y a su beb durante el Aleen Campi de parto y [place]. ? Quin puede estar en la sala de [place] de parto y parto con usted. ? Sus opiniones en cuanto al parto quirrgico de su beb (parto por cesrea [place]) si esto fuera necesario. ? Sus opiniones en cuanto a recibir sangre donada a travs de un tubo (catter) intravenoso (transfusin de [place]) si esto fuera necesario.  Si usted puede: ? Tomar fotografas o videos del nacimiento. ? Comer durante el [place] de parto y [place]. ? Moverse, caminar o Multimedia programmer de posicin durante el [place] de parto y [place].  Qu esperar despus del nacimiento de su beb, como: ? Si se ofrece el pinzamiento y corte tardo del cordn umbilical. ? Quin cuidar a su beb inmediatamente despus del nacimiento. ? Medicamentos o pruebas que pueden recomendarse para su beb. ? Si su hospital o centro de parto apoya la lactancia. ? Cunto Furniture conservator/restorer hospital o en el centro de [place].  Cmo cualquier afeccin mdica que usted tenga puede afectar a su beb o la experiencia de  Aleen Campi de parto y [place].  A fin de prepararse para el nacimiento de su beb, usted tambin debe:  Asistir a todas sus visitas de atencin mdica antes del parto (visitas prenatales) segn lo recomendado por su mdico. Esto es importante.  Preparacin  del hogar para la llegada de su beb. Asegrese de tener lo siguiente: ? Paales. ? Ropa de beb. ? Equipo de alimentacin. ? Haga preparativos para que usted y su beb puedan dormir de IT consultant.  Instale un asiento de beb en su vehculo. Haga verificar el asiento de beb de su coche por un instalador de asientos de beb para asegurarse de que est instalado en forma segura.  Piense en quin la ayudar con su nuevo beb en el hogar durante al [employer] las primeras semanas despus del [place].  Qu puedo esperar cuando llegue al centro de parto o el hospital? Pollyann Savoy que se inicie el [place] de parto y haya sido admitida en el hospital o centro de parto, el mdico podr hacer lo siguiente:  Revisar sus antecedentes de Psychiatrist y cualquier inquietud que usted pueda tener.  Colocar un tubo (catter) intravenoso en una de sus venas. Esto se [place] para administrarle lquidos y medicamentos.  Verificar su presin arterial, temperatura y pulso y la frecuencia cardaca (signos vitales).  Verificar si la bolsa de agua (saco amnitico) se ha roto (ruptura).  Hablar con usted sobre su plan de nacimiento y Chiropractor las opciones para Human resources officer.  Monitoreo Su mdico puede monitorear las contracciones (monitoreo uterino) y el ritmo cardaco del beb (monitoreo fetal). Es posible que el monitoreo se necesite realizar:  Con frecuencia, pero no continuamente (intermitentemente).  Todo el tiempo o durante largos perodos a la vez (continuamente). El monitoreo continuo puede ser necesario si: ? Usted est recibiendo [employer], tales como medicamentos para Engineer, materials o para hacer que las contracciones ms fuertes. ? Tiene complicaciones durante el embarazo o el [place] de [place].  El monitoreo se puede realizar:  Al colocar un estetoscopio especial o un dispositivo manual de monitoreo en el abdomen o verificar los latidos cardacos de su beb y sentir su abdomen para  controlar de contracciones. Este mtodo de monitoreo no registra los latidos cardacos de su beb ni sus contracciones de [place] continua.  Colocar monitores en el abdomen (monitores externos) para Passenger transport manager los latidos cardacos de su beb y la frecuencia y duracin de las contracciones. No tendr que tener colocados los monitores externos en todo momento.  Colocar monitores dentro del tero (monitores internos) para Passenger transport manager los latidos cardacos de su beb y la frecuencia, duracin y fuerza de sus contracciones. ? Su mdico podr usar monitores internos si necesita ms informacin sobre la fuerza de las contracciones o la frecuencia cardaca del beb. ? Los monitores internos se colocan pasando un cable delgado y flexible a travs de la vagina hasta el tero. Segn el tipo de monitor, Insurance claims handler en el tero o en la cabeza del beb hasta el nacimiento. ? Su mdico analizar con usted los beneficios y los riesgos de usar un monitor interno y Chief Executive Officer pedir permiso antes de Psychologist, occupational.  Telemetra. Se trata de un tipo de monitoreo continuo que se puede Education officer, environmental con monitores externos o internos. En lugar de Hospital doctor en la cama, usted puede moverse durante Fish farm manager. Pregunte a su mdico si la telemetra es una opcin para usted.  Examen fsico. Su mdico puede realizarle un examen fsico. Esto puede incluir lo  siguiente:  Verificar en qu posicin se encuentra su beb: ? Con la cabeza hacia la vagina (cabeza abajo). Esta es la posicin ms frecuente. ? Con la cabeza Parker Hannifinhacia la parte superior del tero (cabeza [place]esarriba o de nalgas). Si su beb est en una posicin de nalgas, su mdico puede tratar de hacerlo girar para que quede cabeza abajo a fin de poder tener un parto vaginal. Si parece que su beb no puede nacer con parto vaginal, su mdico puede recomendar una ciruga para dar a luz al beb. En casos muy poco frecuentes, usted puede dar a luz con parto vaginal si el beb  est cabeza arriba (parto de nalgas). ? Posicin lateral (transversal). Los bebs que estn en posicin lateral no pueden nacer por parto vaginal.  Verificar el cuello uterino para determinar: ? Si se est afinando o estirando (borrando). ? Si se est abriendo (dilatando). ? Cunto se ha movido o ha bajado su beb por el canal del parto.  Cules son las tres etapas del [place]trabajo de North Se[place]arto y [place] parto?  El Se[place]trabajo de parto y el parto normales se dividen en tres etapas: Etapa 1  La etapa 1 es la etapa ms larga del Cambriatrabajo de [place]parto y puede durar horas o [place]. La etapa 1 incluye: ? Trabajo de parto temprano. Esto es cuando las contracciones pueden ser irregulares o regulares y leves. En general, las contracciones del trabajo de parto temprano se producen con ms de 10 minutos de diferencia. ? Tresa Moorerabajo de parto activo. Esto es cuando las contracciones son ms largas, ms regulares, ms frecuentes y ms intensas. ? La fase de transicin. Esto es cuando las contracciones ocurren muy seguidas, son Lynnae Sandhoffmuy intensas y pueden durar ms que durante cualquier otra parte del Gro[place]trabajo de [place]parto.  En general, las contracciones son leves, infrecuentes e irregulares al principio. Se hacen ms fuertes, ms frecuentes (aproximadamente cada 2 o 3 minutos) y ms regulares a medida que usted avanza desde un trabajo de parto temprano [place] un [place] Pointtrabajo de [place]parto activo y fase de transicin.  Muchas mujeres progresan a travs de la etapa 1 de forma natural, pero es posible que usted necesite ayuda para continuar avanzando. Si esto ocurre, su mdico puede hablar con usted sobre lo siguiente: ? La ruptura del saco amnitico si es que an no ha ocurrido. ? Administracin de medicamentos para ayudarla a tener contracciones ms fuertes y ms frecuentes.  La etapa 1 finaliza cuando el cuello uterino est completamente dilatado hasta 4 pulgadas (10cm) y completamente borrado. Esto ocurre al final de la fase de transicin. Etapa  2  Una vez que el cuello uterino est totalmente borrado y dilatado a 4 pulgadas (10cm), usted puede comenzar a sentir ganas de pujar. Es comn que el cuerpo tome un descanso de [place]era natural antes de sentir ganas de pujar, especialmente si recibe una epidural u otros medicamentos para Chief Technology Officerel dolor. Este perodo de descanso puede durar un mximo de 1 a 2 horas, segn su experiencia de parto nica.  Durante la etapa 2, las contracciones son generalmente menos doloras porque pujar ayuda a Engineer, materialsaliviar el dolor de las contracciones. En lugar del dolor de las contracciones, puede sentir un dolor urente y por estiramiento, especialmente cuando la parte ms ancha de la cabeza de su beb pasa a travs de la abertura vaginal (coronacin).  Su mdico controlar atentamente su avance con los pujos y el avance del beb a travs de la vagina durante la etapa 2.  Su mdico puede  masajear el rea de la piel entre la abertura vaginal y el ano (perineo) o aplicar compresas tibias en el perineo. Esto ayuda al estiramiento ya que la cabeza del beb empieza a Research officer, trade union, lo cual puede ayudar a evitar un desgarro perineal. ? En algunos casos, se puede hacer una incisin en el peritoneo (episiotoma) para permitir que el beb pase a travs de la abertura vaginal. La episiotoma sirve para agrandar la abertura vaginal a fin de que el beb tenga ms espacio para pasar durante el parto.  Es muy importante respirar y concentrarse para que el mdico pueda controlar la salida de la cabeza del beb. Es posible que su mdico tenga que disminuir la intensidad de los pujos para ayudar a Astronomer perineal.  Despus de que sale la cabeza del beb, en general salen los hombros y el resto del cuerpo muy rpidamente y sin dificultad.  Una vez que el beb nace, se debe cortar el cordn umbilical de inmediato o esto puede demorar 1 o 2 minutos, segn la salud del beb. Este procedimiento puede variar segn el mdico, el hospital y [place].  Si usted y su beb estn lo suficientemente sanos, se Journalist, newspaper beb en el pecho o el abdomen para ayudar a Tour manager del beb y el vnculo entre ustedes. Algunas madres y bebs comienzan la lactancia en este momento. Su equipo mdico secar al beb y lo ayudar a Research scientist (life sciences) caliente [employer]  Es posible que su beb necesite atencin inmediata si: ? Mostr signos de sufrimiento durante el trabajo de [place]. ? Tiene una afeccin mdica. ? Naci antes de tiempo (prematuro). ? Defeca antes del nacimiento (meconio). ? Muestra signos de dificultar en la transicin de estar dentro del tero a estar fuera del tero. Si tiene Industrial/product designer, su equipo mdico la ayudar a Lawyer. Etapa 3  La tercera etapa del trabajo de parto comienza inmediatamente despus del nacimiento de su beb y finaliza despus de la expulsin de la placenta. La placenta es un rgano de que desarrolla durante el embarazo para proporcionar oxgeno y nutrientes al beb en el tero.  La expulsin de la placenta puede requerir algunos pujos y es posible que usted tenga contracciones leves. La lactancia puede estimular las contracciones para ayudar a expulsar la placenta.  Luego de la expulsin de la placenta, el tero debe (contraerse) y [place]. Esto ayuda a Civil Service fast streamer. Para ayudar al tero a contraerse y controlar el [place] mdico puede: ? Administrarle un medicamento inyectable, a travs de un tubo (catter) intravenoso, por boca o a travs del recto (por va rectal). ? Masajear el abdomen o realizar un examen de la vagina para extraer cualquier cogulo de sangre que quede en el tero. ? Vaciar la vejiga colocando un tubo flexible (catter) en la vejiga. ? Alentarla a amamantar a su beb. Una vez que termina el [place], se los controlar a usted y a su beb atentamente para Warehouse manager la seguridad de que ambos estn sanos  hasta que estn listos para ir a casa. Su equipo de atencin Art gallery manager cmo cuidarse y cuidar a su beb. Esta informacin no tiene Theme park manager el consejo del mdico. Asegrese de hacerle al mdico cualquier pregunta que tenga. Document Released: 09/26/2008 Document Revised: 01/23/2017 Document Reviewed: 10/29/2015 Elsevier Interactive Patient Education  2018 [employer].

## 2018-05-28 NOTE — Progress Notes (Signed)
US for growth/BPP done today.  Interpreter Maretta LosBlanca Lindner present for encounter.  Pt states she felt something strange on her head yesterday - "feels like a hole".  IOL scheduled on 8/8.

## 2018-05-29 ENCOUNTER — Other Ambulatory Visit: Payer: Self-pay | Admitting: Advanced Practice Midwife

## 2018-06-04 ENCOUNTER — Encounter (HOSPITAL_COMMUNITY): Payer: Self-pay

## 2018-06-04 ENCOUNTER — Other Ambulatory Visit: Payer: Self-pay

## 2018-06-04 ENCOUNTER — Inpatient Hospital Stay (HOSPITAL_COMMUNITY)
Admission: RE | Admit: 2018-06-04 | Discharge: 2018-06-06 | DRG: 807 | Disposition: A | Discharge status: Home / Self Care | Payer: Medicaid Other | Attending: Obstetrics and Gynecology | Admitting: Obstetrics and Gynecology

## 2018-06-04 ENCOUNTER — Encounter: Payer: Self-pay | Admitting: Family Medicine

## 2018-06-04 DIAGNOSIS — O139 Gestational [pregnancy-induced] hypertension without significant proteinuria, unspecified trimester: Secondary | ICD-10-CM | POA: Diagnosis present

## 2018-06-04 DIAGNOSIS — Z7982 Long term (current) use of aspirin: Secondary | ICD-10-CM | POA: Diagnosis not present

## 2018-06-04 DIAGNOSIS — O0993 Supervision of high risk pregnancy, unspecified, third trimester: Secondary | ICD-10-CM

## 2018-06-04 DIAGNOSIS — O99214 Obesity complicating childbirth: Secondary | ICD-10-CM | POA: Diagnosis present

## 2018-06-04 DIAGNOSIS — O24420 Gestational diabetes mellitus in childbirth, diet controlled: Secondary | ICD-10-CM | POA: Diagnosis present

## 2018-06-04 DIAGNOSIS — O9989 Other specified diseases and conditions complicating pregnancy, childbirth and the puerperium: Secondary | ICD-10-CM

## 2018-06-04 DIAGNOSIS — Z8659 Personal history of other mental and behavioral disorders: Secondary | ICD-10-CM

## 2018-06-04 DIAGNOSIS — O10919 Unspecified pre-existing hypertension complicating pregnancy, unspecified trimester: Secondary | ICD-10-CM | POA: Diagnosis present

## 2018-06-04 DIAGNOSIS — Z789 Other specified health status: Secondary | ICD-10-CM | POA: Diagnosis present

## 2018-06-04 DIAGNOSIS — O1002 Pre-existing essential hypertension complicating childbirth: Secondary | ICD-10-CM | POA: Diagnosis present

## 2018-06-04 DIAGNOSIS — Z3A39 39 weeks gestation of pregnancy: Secondary | ICD-10-CM

## 2018-06-04 DIAGNOSIS — O99210 Obesity complicating pregnancy, unspecified trimester: Secondary | ICD-10-CM | POA: Diagnosis present

## 2018-06-04 DIAGNOSIS — F339 Major depressive disorder, recurrent, unspecified: Secondary | ICD-10-CM | POA: Diagnosis present

## 2018-06-04 DIAGNOSIS — E669 Obesity, unspecified: Secondary | ICD-10-CM | POA: Diagnosis present

## 2018-06-04 DIAGNOSIS — O24919 Unspecified diabetes mellitus in pregnancy, unspecified trimester: Secondary | ICD-10-CM | POA: Diagnosis present

## 2018-06-04 DIAGNOSIS — O99891 Other specified diseases and conditions complicating pregnancy: Secondary | ICD-10-CM

## 2018-06-04 LAB — CBC
HCT: 32.1 % — ABNORMAL LOW (ref 36.0–46.0)
Hemoglobin: 11 g/dL — ABNORMAL LOW (ref 12.0–15.0)
MCH: 31.5 pg (ref 26.0–34.0)
MCHC: 34.3 g/dL (ref 30.0–36.0)
MCV: 92 fL (ref 78.0–100.0)
PLATELETS: 165 10*3/uL (ref 150–400)
RBC: 3.49 MIL/uL — AB (ref 3.87–5.11)
RDW: 15.3 % (ref 11.5–15.5)
WBC: 8 10*3/uL (ref 4.0–10.5)

## 2018-06-04 LAB — TYPE AND SCREEN
ABO/RH(D): A POS
Antibody Screen: NEGATIVE

## 2018-06-04 LAB — ABO/RH: ABO/RH(D): A POS

## 2018-06-04 LAB — GLUCOSE, CAPILLARY
GLUCOSE-CAPILLARY: 71 mg/dL (ref 70–99)
Glucose-Capillary: 70 mg/dL (ref 70–99)
Glucose-Capillary: 75 mg/dL (ref 70–99)
Glucose-Capillary: 92 mg/dL (ref 70–99)

## 2018-06-04 MED ORDER — METHYLDOPA 250 MG PO TABS
250.0000 mg | ORAL_TABLET | Freq: Three times a day (TID) | ORAL | Status: DC
  Filled 2018-06-04 (×3): qty 1

## 2018-06-04 MED ORDER — ONDANSETRON HCL 4 MG/2ML IJ SOLN: 4.0000 mg | Freq: Four times a day (QID) | INTRAMUSCULAR | Status: DC | PRN

## 2018-06-04 MED ORDER — LACTATED RINGERS IV SOLN
INTRAVENOUS | Status: DC
  Administered 2018-06-04: 16:00:00 via INTRAVENOUS
  Administered 2018-06-04: 125 mL/h via INTRAVENOUS

## 2018-06-04 MED ORDER — FENTANYL CITRATE (PF) 100 MCG/2ML IJ SOLN
INTRAMUSCULAR | Status: AC
  Administered 2018-06-04: 100 ug
  Filled 2018-06-04: qty 2

## 2018-06-04 MED ORDER — OXYTOCIN BOLUS FROM INFUSION
500.0000 mL | Freq: Once | INTRAVENOUS | Status: AC
  Administered 2018-06-04: 500 mL via INTRAVENOUS

## 2018-06-04 MED ORDER — OXYCODONE-ACETAMINOPHEN 5-325 MG PO TABS: 2.0000 | ORAL_TABLET | ORAL | Status: DC | PRN

## 2018-06-04 MED ORDER — FENTANYL CITRATE (PF) 100 MCG/2ML IJ SOLN: 100.0000 ug | INTRAMUSCULAR | Status: DC | PRN

## 2018-06-04 MED ORDER — LACTATED RINGERS IV SOLN: 500.0000 mL | INTRAVENOUS | Status: DC | PRN

## 2018-06-04 MED ORDER — OXYTOCIN 40 UNITS IN LACTATED RINGERS INFUSION - SIMPLE MED
2.5000 [IU]/h | INTRAVENOUS | Status: DC
  Filled 2018-06-04: qty 1000

## 2018-06-04 MED ORDER — OXYTOCIN 40 UNITS IN LACTATED RINGERS INFUSION - SIMPLE MED
1.0000 m[IU]/min | INTRAVENOUS | Status: DC
  Administered 2018-06-04: 2 m[IU]/min via INTRAVENOUS

## 2018-06-04 MED ORDER — MISOPROSTOL 50MCG HALF TABLET
50.0000 ug | ORAL_TABLET | ORAL | Status: DC | PRN
  Administered 2018-06-04 (×2): 50 ug via BUCCAL
  Filled 2018-06-04 (×4): qty 1

## 2018-06-04 MED ORDER — TERBUTALINE SULFATE 1 MG/ML IJ SOLN
0.2500 mg | Freq: Once | INTRAMUSCULAR | Status: DC | PRN
  Filled 2018-06-04: qty 1

## 2018-06-04 MED ORDER — OXYCODONE-ACETAMINOPHEN 5-325 MG PO TABS
1.0000 | ORAL_TABLET | ORAL | Status: DC | PRN
Start: 2018-06-04 — End: 2018-06-05

## 2018-06-04 MED ORDER — SOD CITRATE-CITRIC ACID 500-334 MG/5ML PO SOLN: 30.0000 mL | ORAL | Status: DC | PRN

## 2018-06-04 MED ORDER — LIDOCAINE HCL (PF) 1 % IJ SOLN
30.0000 mL | INTRAMUSCULAR | Status: DC | PRN
  Filled 2018-06-04: qty 30

## 2018-06-04 MED ORDER — ACETAMINOPHEN 325 MG PO TABS: 650.0000 mg | ORAL_TABLET | ORAL | Status: DC | PRN

## 2018-06-04 NOTE — Anesthesia Pain Management Evaluation Note (Signed)
  CRNA Pain Management Visit Note  Patient: Michelle Christian, 32 y.o., female  "Hello I am a member of the anesthesia team at [facility]. We have an anesthesia team available at all times to provide care throughout the hospital, including epidural management and anesthesia for C-section. I don't know your plan for the delivery whether it a natural birth, water birth, IV sedation, nitrous supplementation, doula or epidural, but we want to meet your pain goals."   1.Was your pain managed to your expectations on prior hospitalizations?   Yes   2.What is your expectation for pain management during this hospitalization?     Labor support without medications  3.How can we help you reach that goal? Nursing support  Record the patient's initial score and the patient's pain goal.   Pain: 0/10  Pain Goal: natural [facility] wants you to be able to say your pain was always managed very well.  Salome ArntSterling, Vannak Montenegro Marie 06/04/2018

## 2018-06-04 NOTE — Progress Notes (Signed)
LABOR PROGRESS NOTE  Michelle Christian is a 32 y.o. G3P2002 at 6653w0d  admitted for IOL for cHTN and A1GDM.   Subjective: Patient reports that some ctx have become much more intense.   Objective: BP (!) 140/93   Pulse 97   Temp 97.9 F (36.6 C) (Oral)   Resp 18   Ht 5\' 5"  (1.651 m)   Wt 89.1 kg   LMP 09/04/2017   BMI 32.70 kg/m  or  Vitals:   06/04/18 1459 06/04/18 1553 06/04/18 1700 06/04/18 1818  BP: 136/77 129/82 (!) 144/68 (!) 140/93  Pulse: 75 84 74 97  Resp:      Temp:      TempSrc:      Weight:      Height:         Dilation: 3 Effacement (%): 70 Cervical Position: Posterior Station: -3 Presentation: Vertex Exam by:: Patrich Heinze FHT: baseline rate 135, moderate varibility, +acel, no decel Toco: q3-6 min  Labs: Lab Results  Component Value Date   WBC 8.0 06/04/2018   HGB 11.0 (L) 06/04/2018   HCT 32.1 (L) 06/04/2018   MCV 92.0 06/04/2018   PLT 165 06/04/2018    Patient Active Problem List   Diagnosis Date Noted  . Gestational hypertension 06/04/2018  . Obesity in pregnancy 05/21/2018  . Language barrier 02/05/2018  . Diabetes mellitus affecting pregnancy, antepartum 12/18/2017  . Chronic hypertension during pregnancy, antepartum 11/27/2017  . Supervision of high risk pregnancy, antepartum, third trimester 11/27/2017  . History of postpartum depression, currently pregnant   . Major depression, recurrent (HCC) 03/10/2013  . Anxiety state, unspecified 03/10/2013  . Hypertension 09/13/2011    Assessment / Plan: 32 y.o. G3P2002 at 5753w0d here for IOL for cHTN and A1GDM.   Labor: S/p FB and Cytotec x2. Start Pit.  Fetal Wellbeing:  Cat I.  Pain Control:  Patient declines epidural.  Anticipated MOD:  SVD   Marcy Sirenatherine Deidre Carino, D.O. OB Fellow  06/04/2018, 6:23 PM

## 2018-06-04 NOTE — H&P (Signed)
LABOR AND DELIVERY ADMISSION HISTORY AND PHYSICAL NOTE  Michelle Christian is a 32 y.o. female G30P2002 with IUP at 68w0dby 13-week UKoreapresenting for IOL due to chronic HTN. She is spanish-speaking. History gathered via SRomaniainterpreter. She reports positive fetal movement. She denies leakage of fluid or vaginal bleeding. She denies any complications during prior pregnancies or labor.  [facility]:  - Diet-controlled DM - Chronic HTN - History of postpartum depression  Past Medical History: Past Medical History:  Diagnosis Date  . Abnormal LFTs (liver function tests) 10/02/2011   09/30/2011-70/122, then normal 18/19 on 11/18/11   . Depression 01/2013  . Gestational diabetes   . Hemorrhoids during pregnancy   . History of postpartum depression, currently pregnant   . Hypertension     Past Surgical History: Past Surgical History:  Procedure Laterality Date  . NO PAST SURGERIES      Obstetrical History: OB History    Gravida  3   Para  2   Term  2   Preterm      AB      Living  2     SAB      TAB      Ectopic      Multiple      Live Births  2           Social History: Social History   Socioeconomic History  . Marital status: Single    Spouse name: Not on file  . Number of children: Not on file  . Years of education: Not on file  . Highest education level: Not on file  Occupational History  . Not on file  Social Needs  . Financial resource strain: Not on file  . Food insecurity:    Worry: Not on file    Inability: Not on file  . Transportation needs:    Medical: Not on file    Non-medical: Not on file  Tobacco Use  . Smoking status: Never Smoker  . Smokeless tobacco: Never Used  Substance and Sexual Activity  . Alcohol use: No  . Drug use: No  . Sexual activity: Yes    Birth control/protection: None  Lifestyle  . Physical activity:    Days per week: Not on file    Minutes per session:  Not on file  . Stress: Not on file  Relationships  . Social connections:    Talks on phone: Not on file    Gets together: Not on file    Attends religious service: Not on file    Active member of club or organization: Not on file    Attends meetings of clubs or organizations: Not on file    Relationship status: Not on file  Other Topics Concern  . Not on file  Social History Narrative  . Not on file    Family History: Family History  Problem Relation Age of Onset  . Hypertension Maternal Grandmother   . Hypertension Maternal Grandfather   . Anesthesia problems Neg Hx   . Hypotension Neg Hx   . Malignant hyperthermia Neg Hx   . Pseudochol deficiency Neg Hx     Allergies: No Known Allergies  Medications Prior to Admission  Medication Sig Dispense Refill Last Dose  . aspirin EC 81 MG tablet Take 1 tablet (81 mg total) by mouth daily. 100 tablet 2 06/03/2018 at Unknown time  . ferrous sulfate 325 (65 FE) MG EC tablet Take 1 tablet (325  mg total) by mouth 2 (two) times daily with a meal. 60 tablet 3 06/03/2018 at Unknown time  . methyldopa (ALDOMET) 250 MG tablet Take 1 tablet (250 mg total) by mouth 2 (two) times daily. 60 tablet 3 06/04/2018 at Unknown time  . Prenatal Vit-Fe Fumarate-FA (PRENATAL 19) tablet Chew 1 tablet by mouth daily. 90 tablet 2 06/03/2018 at Unknown time  . ACCU-CHEK FASTCLIX LANCETS MISC 1 Device by Percutaneous route 4 (four) times daily. 100 each 12 Taking  . Blood Glucose Monitoring Suppl (ACCU-CHEK AVIVA PLUS) w/Device KIT 1 Device by Does not apply route 4 (four) times daily. [id number] Gestational diabetes;check BS QID 1 kit 0 Taking  . glucose blood test strip Use as instructed QID 100 each 12 Taking  . loratadine (CLARITIN) 10 MG tablet Take 10 mg by mouth daily.   06/02/2018     Review of Systems  All systems reviewed and negative except as stated in HPI  Physical Exam Blood pressure (!) 137/94, pulse 85, temperature 97.9 F (36.6 C), temperature  source Oral, resp. rate 18, height '5\' 5"'$  (1.651 m), weight 89.1 kg, last menstrual period 09/04/2017. General appearance: alert, oriented, NAD Lungs: normal respiratory effort Heart: regular rate Abdomen: soft, non-tender; gravid, FH appropriate for GA Extremities: No calf swelling or tenderness Presentation: cephalic by digital exam  Fetal monitoring: 140 bpm, moderate variability, +acels, no decels  Uterine activity: Irregular ctx  Dilation: Fingertip Effacement (%): Thick Station: -3 Exam by:: LEE  Prenatal labs: ABO, Rh: A/Positive/-- (01/17 0000) Antibody: Negative (01/17 0000) Rubella: Immune (01/17 0000) RPR: Non Reactive (05/17 0957)  HBsAg: Negative (01/17 0000)  HIV: Non Reactive (05/17 0957)  GC/Chlamydia: Negative (07/18) GBS:   Negative (07/18) 2-hr GTT:  147 (11/20/17) Genetic screening:  Normal Anatomy US: Normal  Prenatal Transfer Tool  Maternal Diabetes: Yes:  Diabetes Type:  Diet controlled Genetic Screening: Normal Maternal Ultrasounds/Referrals: Normal Fetal Ultrasounds or other Referrals:  None  Maternal Substance Abuse:  No Significant Maternal Medications:  Meds include: Other: Methyldopa '250mg'$  BID Significant Maternal Lab Results: HbA1C 5.2 on 01/15/18  Results for orders placed or performed during the hospital encounter of 06/04/18 (from the past 24 hour(s))  CBC   Collection Time: 06/04/18  8:16 AM  Result Value Ref Range   WBC 8.0 4.0 - 10.5 K/uL   RBC 3.49 (L) 3.87 - 5.11 MIL/uL   Hemoglobin 11.0 (L) 12.0 - 15.0 g/dL   HCT 32.1 (L) 36.0 - 46.0 %   MCV 92.0 78.0 - 100.0 fL   MCH 31.5 26.0 - 34.0 pg   MCHC 34.3 30.0 - 36.0 g/dL   RDW 15.3 11.5 - 15.5 %   Platelets 165 150 - 400 K/uL  Glucose, capillary   Collection Time: 06/04/18  8:25 AM  Result Value Ref Range   Glucose-Capillary 71 70 - 99 mg/dL    Patient Active Problem List   Diagnosis Date Noted  . Gestational hypertension 06/04/2018  . Obesity in pregnancy 05/21/2018  .  Language barrier 02/05/2018  . Diabetes mellitus affecting pregnancy, antepartum 12/18/2017  . Chronic hypertension during pregnancy, antepartum 11/27/2017  . Supervision of high risk pregnancy, antepartum, third trimester 11/27/2017  . History of postpartum depression, currently pregnant   . Major depression, recurrent ([place]) 03/10/2013  . Anxiety state, unspecified 03/10/2013  . Hypertension 09/13/2011    Assessment: Michelle Christian is a 32 y.o. G3P2002 at 77w0dhere for IOL due to chronic HTN.  #Labor: IOL with Cytotec and FB  #Pain:  Epidural upon request  #FWB: Cat I  #ID: GBS negative #MOF: Breast and Bottle #MOC: Plans for BTL pending discussion regarding payment  #Circ:  No  Isabel Caprice, MS3 06/04/2018, 9:34 AM   OB [place]  I confirm that I have verified the information documented in the medical student's note and that I have also personally performed the physical exam and all medical decision making activities.   Michelle Christian is 32 y.o. G84P2002 female at 24w0dpresenting for IOL for cHTN and A1GDM. No severe range pressures, will continue to monitor BP. CBGs q4h latent labor/q2h active labor. GBS-. IOL with cytotec and FB. Will likely wait for BTL until has Medicaid established. Baby boy (no circ), breast and bottle feeding.   CPhill Myron D.O. OB Fellow  06/04/2018, 2:16 PM

## 2018-06-04 NOTE — Progress Notes (Signed)
Ck pt needs, ordered some snacks, by Orlan LeavensViria Alvarez Spanish Medical Interpreter.

## 2018-06-05 ENCOUNTER — Encounter (HOSPITAL_COMMUNITY): Payer: Self-pay

## 2018-06-05 LAB — RPR: RPR Ser Ql: NONREACTIVE

## 2018-06-05 MED ORDER — AMLODIPINE BESYLATE 5 MG PO TABS: 5.0000 mg | ORAL_TABLET | Freq: Every day | ORAL | 2 refills | Status: DC

## 2018-06-05 MED ORDER — ZOLPIDEM TARTRATE 5 MG PO TABS: 5.0000 mg | ORAL_TABLET | Freq: Every evening | ORAL | Status: DC | PRN

## 2018-06-05 MED ORDER — METHYLERGONOVINE MALEATE 0.2 MG/ML IJ SOLN: 0.2000 mg | INTRAMUSCULAR | Status: DC | PRN

## 2018-06-05 MED ORDER — ACETAMINOPHEN 325 MG PO TABS
650.0000 mg | ORAL_TABLET | ORAL | Status: DC | PRN
  Administered 2018-06-05 (×3): 650 mg via ORAL
  Filled 2018-06-05 (×3): qty 2

## 2018-06-05 MED ORDER — WITCH HAZEL-GLYCERIN EX PADS: 1.0000 "application " | MEDICATED_PAD | CUTANEOUS | Status: DC | PRN

## 2018-06-05 MED ORDER — IBUPROFEN 600 MG PO TABS: 600.0000 mg | ORAL_TABLET | Freq: Four times a day (QID) | ORAL | Status: DC

## 2018-06-05 MED ORDER — ONDANSETRON HCL 4 MG/2ML IJ SOLN: 4.0000 mg | INTRAMUSCULAR | Status: DC | PRN

## 2018-06-05 MED ORDER — METHYLERGONOVINE MALEATE 0.2 MG PO TABS: 0.2000 mg | ORAL_TABLET | ORAL | Status: DC | PRN

## 2018-06-05 MED ORDER — AMLODIPINE BESYLATE 5 MG PO TABS
5.0000 mg | ORAL_TABLET | Freq: Every day | ORAL | Status: DC
  Administered 2018-06-05: 5 mg via ORAL
  Filled 2018-06-05 (×2): qty 1

## 2018-06-05 MED ORDER — MEASLES, MUMPS & RUBELLA VAC ~~LOC~~ INJ
0.5000 mL | INJECTION | Freq: Once | SUBCUTANEOUS | Status: DC
  Filled 2018-06-05: qty 0.5

## 2018-06-05 MED ORDER — IBUPROFEN 600 MG PO TABS
600.0000 mg | ORAL_TABLET | Freq: Four times a day (QID) | ORAL | Status: DC
  Administered 2018-06-05 – 2018-06-06 (×6): 600 mg via ORAL
  Filled 2018-06-05 (×6): qty 1

## 2018-06-05 MED ORDER — COCONUT OIL OIL
1.0000 "application " | TOPICAL_OIL | Status: DC | PRN
  Administered 2018-06-05: 1 via TOPICAL
  Filled 2018-06-05: qty 120

## 2018-06-05 MED ORDER — SIMETHICONE 80 MG PO CHEW: 80.0000 mg | CHEWABLE_TABLET | ORAL | Status: DC | PRN

## 2018-06-05 MED ORDER — ONDANSETRON HCL 4 MG PO TABS: 4.0000 mg | ORAL_TABLET | ORAL | Status: DC | PRN

## 2018-06-05 MED ORDER — FLEET ENEMA 7-19 GM/118ML RE ENEM: 1.0000 | ENEMA | Freq: Every day | RECTAL | Status: DC | PRN

## 2018-06-05 MED ORDER — FERROUS SULFATE 325 (65 FE) MG PO TABS
325.0000 mg | ORAL_TABLET | Freq: Two times a day (BID) | ORAL | Status: DC
  Administered 2018-06-05 – 2018-06-06 (×2): 325 mg via ORAL
  Filled 2018-06-05 (×2): qty 1

## 2018-06-05 MED ORDER — OXYCODONE HCL 5 MG PO TABS: 10.0000 mg | ORAL_TABLET | ORAL | Status: DC | PRN

## 2018-06-05 MED ORDER — DIBUCAINE 1 % RE OINT: 1.0000 "application " | TOPICAL_OINTMENT | RECTAL | Status: DC | PRN

## 2018-06-05 MED ORDER — OXYCODONE HCL 5 MG PO TABS: 5.0000 mg | ORAL_TABLET | ORAL | Status: DC | PRN

## 2018-06-05 MED ORDER — AMLODIPINE BESYLATE 10 MG PO TABS
10.0000 mg | ORAL_TABLET | Freq: Every day | ORAL | Status: DC
  Administered 2018-06-06: 10 mg via ORAL
  Filled 2018-06-05 (×2): qty 1

## 2018-06-05 MED ORDER — TETANUS-DIPHTH-ACELL PERTUSSIS 5-2.5-18.5 LF-MCG/0.5 IM SUSP: 0.5000 mL | Freq: Once | INTRAMUSCULAR | Status: DC

## 2018-06-05 MED ORDER — BISACODYL 10 MG RE SUPP: 10.0000 mg | Freq: Every day | RECTAL | Status: DC | PRN

## 2018-06-05 MED ORDER — DIPHENHYDRAMINE HCL 25 MG PO CAPS: 25.0000 mg | ORAL_CAPSULE | Freq: Four times a day (QID) | ORAL | Status: DC | PRN

## 2018-06-05 MED ORDER — DOCUSATE SODIUM 100 MG PO CAPS
100.0000 mg | ORAL_CAPSULE | Freq: Two times a day (BID) | ORAL | Status: DC
  Administered 2018-06-05 – 2018-06-06 (×3): 100 mg via ORAL
  Filled 2018-06-05 (×3): qty 1

## 2018-06-05 MED ORDER — PRENATAL MULTIVITAMIN CH
1.0000 | ORAL_TABLET | Freq: Every day | ORAL | Status: DC
  Administered 2018-06-05 – 2018-06-06 (×2): 1 via ORAL
  Filled 2018-06-05 (×2): qty 1

## 2018-06-05 MED ORDER — BENZOCAINE-MENTHOL 20-0.5 % EX AERO: 1.0000 "application " | INHALATION_SPRAY | CUTANEOUS | Status: DC | PRN

## 2018-06-05 NOTE — Discharge Instructions (Signed)
Parto vaginal, cuidados posteriores  Vaginal Delivery, Care After  Siga estas instrucciones durante las prximas semanas. Estas indicaciones le proporcionan informacin acerca de cmo deber cuidarse despus del parto vaginal. Su mdico tambin podr darle indicaciones ms especficas. El tratamiento ha sido planificado segn las prcticas mdicas actuales, pero en algunos casos pueden ocurrir problemas. Llame al mdico si tiene problemas o preguntas.  Qu puedo esperar despus del procedimiento?  Despus de un parto vaginal, es frecuente tener lo siguiente:   Hemorragia leve de la vagina.   Dolor en el abdomen, la vagina y la zona de la piel entre la abertura vaginal y el ano (perineo).   Calambres plvicos.   Fatiga.    Siga estas indicaciones en su casa:  Medicamentos   Tome los medicamentos de venta libre y los recetados solamente como se lo haya indicado el mdico.   Si le recetaron un antibitico, tmelo como se lo haya indicado el mdico. No interrumpa la administracin del antibitico hasta que lo haya terminado.  Conducir     No conduzca ni opere maquinaria pesada mientras toma analgsicos recetados.   No conduzca durante 24horas si le administraron un sedante.  Estilo de vida   No beba alcohol. Esto es de suma importancia si est amamantando o toma analgsicos.   No consuma productos que contengan tabaco, incluidos cigarrillos, tabaco de mascar o cigarrillos electrnicos. Si necesita ayuda para dejar de fumar, consulte al mdico.  Qu debe comer y beber   Beba al menos 8vasos de ochoonzas (240cc) de agua todos los das a menos que el mdico le indique lo contrario. Si elige amamantar al beb, quiz deba beber an ms cantidad de agua.   Coma alimentos ricos en fibras todos los das. Estos alimentos pueden ayudarla a prevenir o aliviar el estreimiento. Los alimentos ricos en fibras incluyen, entre otros:  ? Panes y cereales integrales.  ? Arroz integral.  ? Frijoles.  ? Frutas y verduras  frescas.  Actividad   Retome sus actividades normales como se lo haya indicado el mdico. Pregntele al mdico qu actividades son seguras para usted.   Descanse todo lo que pueda. Trate de descansar o tomar una siesta mientras el beb est durmiendo.   No levante objetos que pesen ms que su beb o 10libras (4,5kg) hasta que el mdico le diga que es seguro.   Hable con el mdico sobre cundo puede retomar la actividad sexual. Esto puede depender de lo siguiente:  ? Riesgo de sufrir una infeccin.  ? Velocidad de cicatrizacin.  ? Comodidad y deseo de retomar la actividad sexual.  Cuidados vaginales   Si le realizaron una episiotoma o tuvo un desgarro vaginal, contrlese la zona todos los das para detectar signos de infeccin. Est atenta a los siguientes signos:  ? Aumento del enrojecimiento, la hinchazn o el dolor.  ? Mayor presencia de lquido o sangre.  ? Calor.  ? Pus o mal olor.   No use tampones ni se haga duchas vaginales hasta que el mdico la autorice.   Controle la sangre que elimina por la vagina para detectar cogulos de sangre. Estos pueden tener el aspecto de grumos de color rojo oscuro, o secrecin marrn o negra.  Instrucciones generales   Mantenga el perineo limpio y seco, como se lo haya indicado el mdico.   Use ropa cmoda y suelta.   Cuando vaya al bao, siempre higiencese de adelante hacia atrs.   Pregntele al mdico si puede ducharse o tomar baos de inmersin.   Si se le realiz una episiotoma o tuvo un desgarro perineal durante el trabajo del parto o el parto, es posible que el mdico le indique que no tome baos de inmersin durante un determinado tiempo.   Use un sostn que sujete y ajuste bien sus pechos.   Si es posible, pdale a alguien que la ayude con las tareas del hogar y a cuidar del beb durante al menos algunos das despus de que le den el alta del hospital.   Concurra a todas las visitas de seguimiento para usted y el beb, como se lo haya indicado el  mdico. Esto es importante.  Comunquese con un mdico si:   Tiene los siguientes sntomas:  ? Secrecin vaginal que tiene mal olor.  ? Dificultad para orinar.  ? Dolor al orinar.  ? Aumento o disminucin repentinos de la frecuencia de las deposiciones.  ? Ms enrojecimiento, hinchazn o dolor alrededor de la episiotoma o del desgarro vaginal.  ? Ms secrecin de lquido o sangre de la episiotoma o del desgarro vaginal.  ? Pus o mal olor proveniente de la episiotoma o del desgarro vaginal.  ? Fiebre.  ? Erupcin cutnea.  ? Poco inters o falta de inters en actividades que solan gustarle.  ? Dudas sobre su cuidado y el del beb.   Siente la episiotoma o el desgarro vaginal caliente al tacto.   La episiotoma o el desgarro vaginal se abren o no parecen cicatrizar.   Siente dolor en las mamas, o estn duras o enrojecidas.   Siente tristeza o preocupacin de forma inusual.   Siente nuseas o vomita.   Elimina cogulos de sangre grandes por la vagina. Si expulsa un cogulo de sangre por la vagina, gurdelo para mostrrselo a su mdico. No tire la cadena sin que el mdico examine el cogulo de sangre antes.   Orina ms de lo habitual.   Se siente mareada o se desmaya.   No ha amamantado para nada y no ha tenido un perodo menstrual durante 12 semanas despus del parto.   Dej de amamantar al beb y no ha tenido su perodo menstrual durante 12 semanas despus de dejar de amamantar.  Solicite ayuda de inmediato si:   Tiene los siguientes sntomas:  ? Dolor que no desaparece o no mejora con medicamentos.  ? Dolor en el pecho.  ? Dificultad para respirar.  ? Visin borrosa o manchas en la vista.  ? Pensamientos de autolesionarse o lesionar al beb.   Comienza a sentir dolor en el abdomen o en una de las piernas.   Presenta un dolor de cabeza intenso.   Se desmaya.   Tiene una hemorragia de la vagina tan intensa que empapa dos toallitas sanitarias en una hora.  Esta informacin no tiene como fin  reemplazar el consejo del mdico. Asegrese de hacerle al mdico cualquier pregunta que tenga.  Document Released: 10/14/2005 Document Revised: 02/05/2017 Document Reviewed: 10/29/2015  Elsevier Interactive Patient Education  2018 Elsevier Inc[employer]

## 2018-06-05 NOTE — Progress Notes (Signed)
Post Partum Day 1 Subjective: up ad lib, voiding and + flatus. Reports occasional abdominal pain up to 6/10 but none noted at time of exam. No other complaints. No RUQ pain, headache, vision changes, fever, chills, or peripheral edema.  Objective: Blood pressure (!) 147/91, pulse (!) 59, temperature 97.9 F (36.6 C), temperature source Oral, resp. rate 18, height 5\' 5"  (1.651 m), weight 89.1 kg, last menstrual period 09/04/2017, unknown if currently breastfeeding.  Physical Exam:  General: alert and cooperative Lochia: appropriate Uterine Fundus: difficult to palpate. No tenderness noted. DVT Evaluation: No cords or calf tenderness. No significant calf/ankle edema.  Recent Labs    06/04/18 0816  HGB 11.0*  HCT 32.1*    Assessment/Plan: Possible discharge today or tomorrow.   LOS: 1 day   Margarita RanaHarrison D Ky Rumple 06/05/2018, 7:25 AM

## 2018-06-05 NOTE — Lactation Note (Signed)
This note was copied from a baby's chart. Lactation Consultation Note  Patient Name: Michelle Christian ZOXWR'UToday's Date: 06/05/2018 Reason for consult: Initial assessment;Term;Maternal endocrine disorder Type of Endocrine Disorder?: Diabetes  Visited with P3 Mom of 14 hr old term baby.  Mom has chronic hypertension and gestational diabetes (diet controlled). This is Mom's 3rd baby to breastfeed.   Family friend interpreted for family (she is an interpreter) Baby finishing up with bath.  Baby placed STS on Mom.  Baby showing subtle cues and recommended she latch baby.  Mom using cradle hold, but baby easily latched.  Reminded Mom to wait for a wide mouth before bringing baby to breast to prevent sore nipples. Adjusted and pulled gently on chin to open his mouth wider.  Mom denies any questions or needing any help. Encouraged STS and feeding baby often when he cues he is hungry. Lactation brochure left with family.  Mom told of IP and OP lactation services available to her. Encouraged To call prn.    Consult Status Consult Status: Follow-up Date: 06/06/18 Follow-up type: In-patient    Judee ClaraSmith, Fanchon Papania E 06/05/2018, 12:29 PM

## 2018-06-05 NOTE — Progress Notes (Signed)
Admission paperwork reviewed with patient. Interpreter used, Interpreter ID [id number].   Tylene FantasiaBelinda Mehar Kirkwood, RN 06/05/18 12:30am

## 2018-06-06 MED ORDER — AMLODIPINE BESYLATE 5 MG PO TABS: 10.0000 mg | ORAL_TABLET | Freq: Every day | ORAL | 2 refills | Status: AC

## 2018-06-06 MED ORDER — IBUPROFEN 600 MG PO TABS: 600.0000 mg | ORAL_TABLET | Freq: Four times a day (QID) | ORAL | 0 refills | Status: DC | PRN

## 2018-06-06 NOTE — Lactation Note (Signed)
This note was copied from a baby's chart. Lactation Consultation Note Baby 34 hrs old. Spanish interpreter [phone] [employer] video used. Mom states baby BF well. Nipples sore intact. Nipples everted. Gave shells and hand pump. Demonstrated. Mom states understanding. Reviewed I&O, breast massage, nipple care, STS, breast filling, pumping, engorgement, and milk storage. Experienced BF mom feels confident about feeding. Mom has WIC. Has Resources information. Mom states has no further questions. Encouraged to call for assistance or questions.  Patient Name: Michelle Christian EAVWU'JToday's Date: 06/06/2018 Reason for consult: Follow-up assessment   Maternal Data    Feeding    LATCH Score       Type of Nipple: Everted at rest and after stimulation  Comfort (Breast/Nipple): Filling, red/small blisters or bruises, mild/mod discomfort        Interventions Interventions: Breast feeding basics reviewed;Breast massage;Expressed milk;Shells;Hand express;Hand pump;Pre-pump if needed;Breast compression  Lactation Tools Discussed/Used Tools: Shells;Pump Shell Type: Inverted Breast pump type: Manual WIC Program: Yes Pump Review: Setup, frequency, and cleaning;Milk Storage Initiated by:: Peri JeffersonL. Allona Gondek RN IBCLC Date initiated:: 06/06/18   Consult Status Consult Status: Complete Date: 06/06/18    Charyl DancerCARVER, Maxima Skelton G 06/06/2018, 8:24 AM

## 2018-06-06 NOTE — Discharge Summary (Signed)
OB Discharge Summary     Patient Name: Michelle Christian DOB: 05/12/1986 MRN: [id number]  Date of admission: 06/04/2018 Delivering MD: Christin Fudge   Date of discharge: 06/06/2018  Admitting diagnosis: INDUCTION Intrauterine pregnancy: [redacted]w[redacted]d    Secondary diagnosis:  Active Problems:   Major depression, recurrent ([place]   Chronic hypertension during pregnancy, antepartum   Supervision of high risk pregnancy, antepartum, third trimester   History of postpartum depression, currently pregnant   Diabetes mellitus affecting pregnancy, antepartum   Language barrier   Obesity in pregnancy   Gestational hypertension  Additional problems: GBS neg     Discharge diagnosis: Term Pregnancy Delivered, CHTN and Type 2 DM                                                                                                Post partum procedures:none  Augmentation: Pitocin, Cytotec and Foley Balloon  Complications: None  Hospital course:  Induction of Labor With Vaginal Delivery   32y.o. yo G3P3003 at 327w0das admitted to the hospital 06/04/2018 for induction of labor.  Indication for induction: cHTN & DM (diet).  Patient had an uncomplicated labor course as follows: Membrane Rupture Time/Date: 8:10 PM ,06/04/2018   Intrapartum Procedures: Episiotomy: None [1]                                         Lacerations:  None [1]  Patient had delivery of a Viable infant.  Information for the patient's newborn:  [name], [name][[id number]]Delivery Method: Vaginal, Spontaneous(Filed from Delivery Summary)   06/04/2018  Details of delivery can be found in separate delivery note.  Patient had a routine postpartum course. Her CBGs during her stay remained between 70-90s. On PPD#1 she was started on Norvasc for BP control. Patient is discharged home 06/06/18.  Physical exam  Vitals:   06/05/18 0900 06/05/18 1430 06/05/18 2203 06/06/18 0524  BP: (!) 144/92 (!) 132/94 (!) 132/95 117/83  Pulse: 64 92 78  64  Resp: '18 18 16 17  '$ Temp: 97.8 F (36.6 C) 98.1 F (36.7 C) 97.8 F (36.6 C) 97.7 F (36.5 C)  TempSrc: Oral Oral Oral   SpO2:  98% 100%   Weight:      Height:       General: alert and cooperative Lochia: appropriate Uterine Fundus: firm Incision: N/A DVT Evaluation: No evidence of DVT seen on physical exam. Labs: Lab Results  Component Value Date   WBC 8.0 06/04/2018   HGB 11.0 (L) 06/04/2018   HCT 32.1 (L) 06/04/2018   MCV 92.0 06/04/2018   PLT 165 06/04/2018   CMP Latest Ref Rng & Units 12/18/2017  Glucose 65 - 99 mg/dL 80  BUN 6 - 20 mg/dL 6  Creatinine 0.57 - 1.00 mg/dL 0.53(L)  Sodium 134 - 144 mmol/L 139  Potassium 3.5 - 5.2 mmol/L 4.2  Chloride 96 - 106 mmol/L 105  CO2 20 - 29 mmol/L 20  Calcium 8.7 - 10.2 mg/dL 9.1  Total Protein 6.0 - 8.5 g/dL 6.7  Total Bilirubin 0.0 - 1.2 mg/dL 0.2  Alkaline Phos 39 - 117 IU/L 55  AST 0 - 40 IU/L 49(H)  ALT 0 - 32 IU/L 87(H)    Discharge instruction: per After Visit Summary and "Baby and Me Booklet".  After visit meds:  Allergies as of 06/06/2018   No Known Allergies     Medication List    STOP taking these medications   ACCU-CHEK AVIVA PLUS w/Device Kit   ACCU-CHEK FASTCLIX LANCETS Misc   aspirin EC 81 MG tablet   glucose blood test strip   loratadine 10 MG tablet Commonly known as:  CLARITIN   methyldopa 250 MG tablet Commonly known as:  ALDOMET     TAKE these medications   amLODipine 5 MG tablet Commonly known as:  NORVASC Take 2 tablets (10 mg total) by mouth daily.   ferrous sulfate 325 (65 FE) MG EC tablet Take 1 tablet (325 mg total) by mouth 2 (two) times daily with a meal.   ibuprofen 600 MG tablet Commonly known as:  ADVIL,MOTRIN Take 1 tablet (600 mg total) by mouth every 6 (six) hours as needed.   PRENATAL 19 tablet Chew 1 tablet by mouth daily.       Diet: carb modified diet  Activity: Advance as tolerated. Pelvic rest for 6 weeks.   Outpatient follow up:1 week for BP  check/BH visit; 6 wks for PP visit and GTT Follow up Appt:No future appointments. Follow up Visit:No follow-ups on file.  Postpartum contraception: interval BTL pending MCD  Newborn Data: Live born female  Birth Weight: 7 lb 2.6 oz (3249 g) APGAR: 9, 9  Newborn Delivery   Birth date/time:  06/04/2018 22:09:00 Delivery type:  Vaginal, Spontaneous     Baby Feeding: Bottle and Breast Disposition:home with mother   06/06/2018 Serita Grammes, CNM  9:02 AM

## 2018-06-06 NOTE — Progress Notes (Signed)
CSW received consult for [name] due to history of depression dating back to 2014. CSW spoke with [name] using Spanish Michelle Christian [phone]#[id number]. CSW and [name] discussed history of depression. [name] reports meeting with Michelle AidJamie, L[facility] at [facility] and reports feeling significantly better with her anxiety since their meetings. [name] reports a good support system from her FOB and family. [name] requested that CSW reach out to [place], [place] to request postpartum appointment. CSW sent Michelle MuirJamie, LCSW an Epic message with that information. [name] did not have questions or concerns.  Michelle Christian, MSW, LCSW-A Clinical Social Worker [facility] [phone]

## 2018-06-07 LAB — BIRTH TISSUE RECOVERY COLLECTION (PLACENTA DONATION)

## 2018-06-10 ENCOUNTER — Telehealth: Payer: Self-pay | Admitting: General Practice

## 2018-06-10 NOTE — Telephone Encounter (Signed)
Patient notified of appt for BP check tomorrow 06/11/18 at 2:00pm.

## 2018-06-11 ENCOUNTER — Ambulatory Visit (INDEPENDENT_AMBULATORY_CARE_PROVIDER_SITE_OTHER): Payer: Self-pay | Admitting: *Deleted

## 2018-06-11 ENCOUNTER — Other Ambulatory Visit: Payer: Self-pay

## 2018-06-11 ENCOUNTER — Encounter: Payer: Self-pay | Admitting: Family Medicine

## 2018-06-11 ENCOUNTER — Ambulatory Visit: Payer: Self-pay | Admitting: Clinical

## 2018-06-11 VITALS — BP 129/88 | HR 102 | Wt 177.4 lb

## 2018-06-11 DIAGNOSIS — F4322 Adjustment disorder with anxiety: Secondary | ICD-10-CM

## 2018-06-11 DIAGNOSIS — Z0130 Encounter for examination of blood pressure without abnormal findings: Secondary | ICD-10-CM

## 2018-06-11 NOTE — Progress Notes (Signed)
Video interpreter Maxine GlennMonica [phone]#[id number] used for encounter.  Pt denies H/A or visual disturbances.  Pt Rx for Amlodipine was filled by [facility] as 5mg  once daily and this is what pt has been taking even though Rx was written for 2 tabs (10 mg) daily. BP values and pt status reviewed with Dr. Adrian BlackwaterStinson. He advised pt to continue Amlodipine 5 mg daily. Pt informed and voiced understanding.
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
## 2018-06-12 NOTE — Progress Notes (Signed)
Chart reviewed - agree with RN documentation.   

## 2018-06-18 ENCOUNTER — Encounter (HOSPITAL_COMMUNITY): Payer: Self-pay | Admitting: *Deleted

## 2018-06-18 ENCOUNTER — Inpatient Hospital Stay (HOSPITAL_COMMUNITY)
Admission: AD | Admit: 2018-06-18 | Discharge: 2018-06-18 | Disposition: A | Discharge status: Home / Self Care | Payer: Medicaid Other | Source: Ambulatory Visit | Attending: Obstetrics and Gynecology | Admitting: Obstetrics and Gynecology

## 2018-06-18 DIAGNOSIS — N644 Mastodynia: Secondary | ICD-10-CM | POA: Insufficient documentation

## 2018-06-18 DIAGNOSIS — O9089 Other complications of the puerperium, not elsewhere classified: Secondary | ICD-10-CM | POA: Insufficient documentation

## 2018-06-18 DIAGNOSIS — O9229 Other disorders of breast associated with pregnancy and the puerperium: Secondary | ICD-10-CM

## 2018-06-18 NOTE — MAU Provider Note (Signed)
History     CSN: [id number]  Arrival date and time: 06/18/18 1020   First Provider Initiated Contact with Patient 06/18/18 1106      Chief Complaint  Patient presents with  . Breast Pain  . Generalized Body Aches   HPI Michelle Christian is a 32 y.o. G3P3003 postpartum from a vaginal delivery on 8/8 who presents with left breast pain. She states the pain started yesterday and is worse when she nurses. She states it hurts at the nipple and anteriorly. She denies any fever at home. She reports feeling unwell, but is not sleeping or eating regularly.   OB History    Gravida  3   Para  3   Term  3   Preterm      AB      Living  3     SAB      TAB      Ectopic      Multiple  0   Live Births  3           Past Medical History:  Diagnosis Date  . Abnormal LFTs (liver function tests) 10/02/2011   09/30/2011-70/122, then normal 18/19 on 11/18/11   . Depression 01/2013  . Gestational diabetes   . Hemorrhoids during pregnancy   . History of postpartum depression, currently pregnant   . Hypertension     Past Surgical History:  Procedure Laterality Date  . NO PAST SURGERIES      Family History  Problem Relation Age of Onset  . Hypertension Maternal Grandmother   . Hypertension Maternal Grandfather   . Anesthesia problems Neg Hx   . Hypotension Neg Hx   . Malignant hyperthermia Neg Hx   . Pseudochol deficiency Neg Hx     Social History   Tobacco Use  . Smoking status: Never Smoker  . Smokeless tobacco: Never Used  Substance Use Topics  . Alcohol use: No  . Drug use: No    Allergies: No Known Allergies  Medications Prior to Admission  Medication Sig Dispense Refill Last Dose  . amLODipine (NORVASC) 5 MG tablet Take 2 tablets (10 mg total) by mouth daily. (Patient not taking: Reported on 06/11/2018) 30 tablet 2 Not Taking  . ferrous sulfate 325 (65 FE) MG EC tablet Take 1 tablet (325 mg total) by mouth 2 (two) times daily with a meal. 60 tablet 3 Taking    . ibuprofen (ADVIL,MOTRIN) 600 MG tablet Take 1 tablet (600 mg total) by mouth every 6 (six) hours as needed. 30 tablet 0 Taking  . Prenatal Vit-Fe Fumarate-FA (PRENATAL 19) tablet Chew 1 tablet by mouth daily. 90 tablet 2 Taking    Review of Systems  Constitutional: Negative.  Negative for fatigue and fever.  HENT: Negative.   Respiratory: Negative.  Negative for shortness of breath.   Cardiovascular: Negative.  Negative for chest pain.  Gastrointestinal: Negative.  Negative for abdominal pain, constipation, diarrhea, nausea and vomiting.  Genitourinary: Negative.  Negative for dysuria.  Musculoskeletal:       Breast pain  Neurological: Negative.  Negative for dizziness and headaches.   Physical Exam   Blood pressure (!) 121/91, pulse (!) 106, temperature 98.3 F (36.8 C), temperature source Oral, resp. rate 18, SpO2 97 %, currently breastfeeding.  Physical Exam  Nursing note and vitals reviewed. Constitutional: She is oriented to person, place, and time. She appears well-developed and well-nourished. No distress.  HENT:  Head: Normocephalic.  Eyes: Pupils are equal, round, and reactive to  light.  Cardiovascular: Normal rate, regular rhythm and normal heart sounds.  Respiratory: Effort normal and breath sounds normal. No respiratory distress. Left breast exhibits no inverted nipple, no mass, no skin change and no tenderness.    GI: Soft. Bowel sounds are normal. She exhibits no distension. There is no tenderness.  Neurological: She is alert and oriented to person, place, and time.  Skin: Skin is warm and dry.  Psychiatric: She has a normal mood and affect. Her behavior is normal. Judgment and thought content normal.    MAU Course  Procedures  MDM Findings consistent with plugged duct. No signs or symptoms of mastitis at this time. Comfort measures reviewed at length to remove and prevent plugged ducts in the future.  Assessment and Plan   1. Postpartum breast pain     -Discharge home in stable condition -Mastitis precautions discussed -Patient [facility] as [facility] for [facility] -Patient may return to MAU as needed or if her condition were to change or worsen   Rolm BookbinderCaroline M Neill CNM 06/18/2018, 11:06 AM

## 2018-06-18 NOTE — MAU Note (Addendum)
Pain started yesterday, left breast, worse when she breastfeeds. Pt is breastfeeding, vag delivery 8/8.  Denies redness, is hot and hard.  ? Fever.  Having chills at time.  Feels weak.  Body aches.

## 2018-06-18 NOTE — Discharge Instructions (Signed)
Mastitis (Mastitis) La mastitis es la irritacin, dolor e hinchazn (inflamacin) de una zona en la mama. La causa es una infeccin que se produce cuando una bacteria entra a travs de la piel. La infeccin puede tratarse con antibiticos. CUIDADOS EN EL HOGAR  Tome slo los medicamentos segn le haya indicado el mdico.  Si el mdico le ha recetado antibiticos, tmelos segn las indicaciones. Finalice la prescripcin completa, aunque se sienta mejor.  No use un sostn demasiado ajustado o con aro. Use un sostn de soporte.  Beba ms lquidos, especialmente si tiene fiebre.  Si est amamantando: ? Radiation protection practitionerMantenga la mama vaca. El Office Depotmdico le dir si su leche es segura. Use un sacaleche si le aconsejan dejar de amamantar. ? Mantenga los pezones secos y limpios. ? Vace la primera mama antes de amamantar con la segunda. Use un sacaleche si el beb no vaca la mama. ? Si vuelve a trabajar, use el sacaleches mientras est en el trabajo. ? Evite que las [address] se llenen mucho de [place] (congestin)  SOLICITE AYUDA SI:  Observa que sale un lquido similar a pus por la mama.  Los sntomas no mejoran dentro de los [address]  [address].  SOLICITE AYUDA DE INMEDIATO SI:  El dolor y la inflamacin empeoran.  El dolor no se alivia con los United Parcelmedicamentos.  Observa una lnea roja que se extiende desde la mama hasta la axila.  Tiene fiebre o sntomas que persisten durante ms de 2-3 das.  Tiene fiebre y los sntomas empeoran repentinamente.  ASEGRESE DE QUE:  Comprende estas instrucciones.  Controlar su afeccin.  Recibir ayuda de inmediato si no mejora o si empeora.  Esta informacin no tiene Theme park managercomo fin reemplazar el consejo del mdico. Asegrese de hacerle al mdico cualquier pregunta que tenga. Document Released: 11/16/2010 Document Revised: 02/28/2015 Document Reviewed: 05/14/2013 Elsevier Interactive Patient Education  2017 [employer]

## 2018-07-02 MED FILL — AMLODIPINE BESYLATE 5 MG TA: 5 | 15 days supply | Qty: 30 | Fill #0

## 2018-07-16 ENCOUNTER — Encounter: Payer: Self-pay | Admitting: Student

## 2018-07-16 ENCOUNTER — Ambulatory Visit (INDEPENDENT_AMBULATORY_CARE_PROVIDER_SITE_OTHER): Payer: Self-pay | Admitting: Student

## 2018-07-16 MED ORDER — NORETHINDRONE 0.35 MG PO TABS: 1.0000 | ORAL_TABLET | Freq: Every day | ORAL | 11 refills | Status: DC

## 2018-07-16 MED FILL — NORETHINDRONE 0.35 MG TAB: 0.35 | 28 days supply | Qty: 28 | Fill #0

## 2018-07-16 NOTE — Patient Instructions (Addendum)
Llame el numero para que tenga cita por su presion.  [facility] [address], [place], [place] [address]  Phone: [phone]   Mantengase en ayunas por su prueba de azucar el proximo Peggye Leyjueves

## 2018-07-16 NOTE — Progress Notes (Signed)
Subjective:     Michelle Christian is a 32 y.o. female who presents for a postpartum visit. She is 6 weeks postpartum following a spontaneous vaginal delivery. I have fully reviewed the prenatal and intrapartum course. The delivery was at 39 gestational weeks. Outcome: spontaneous vaginal delivery. Anesthesia: none. Postpartum course has been uncomplicated. Baby's course has been uneventful. Baby is feeding by breast and bottle with Rush BarerGerber. Bleeding no bleeding. Bowel function is normal. Bladder function is normal. Patient is not sexually active. Contraception method is oral progesterone-only contraceptive. Postpartum depression screening: negative.  The following portions of the patient's history were reviewed and updated as appropriate: allergies, current medications, past family history, past medical history, past social history, past surgical history and problem list.  Review of Systems Pertinent items are noted in HPI.   Objective:    BP 120/84   Pulse 77   Wt 175 lb 9.6 oz (79.7 kg)   BMI 29.22 kg/m   General:  alert, cooperative and no distress   Breasts:  negative  Lungs: clear to auscultation bilaterally  Heart:  regular rate and rhythm, S1, S2 normal, no murmur, click, rub or gallop  Abdomen: soft, non-tender; bowel sounds normal; no masses,  no organomegaly   Vulva:  not evaluated  Vagina: not evaluated  Cervix:  not evaluated  Corpus: not examined  Adnexa:  not evaluated  Rectal Exam: Not performed.        Assessment:    Heatlhy postpartum exam. Pap smear not done at today's visit.   Plan:    1. Contraception: oral progesterone-only contraceptive. Discussed with patient the importance of taking pill at the same time every day; encouraged her to visit [facility] to talk more about IUD or Nexplanon.  2. Patient will return in 1 week for her 2 hour GTT.  3. [facility]; continue taking amlodipine 10 mg daily. Patient has  refills.  4. Patient not seeing Asher MuirJamie today but passed her PPD screens.  5. Follow up in: 1 week or as needed.

## 2018-07-21 ENCOUNTER — Other Ambulatory Visit: Payer: Self-pay | Admitting: General Practice

## 2018-07-21 DIAGNOSIS — O24439 Gestational diabetes mellitus in the puerperium, unspecified control: Secondary | ICD-10-CM

## 2018-07-23 ENCOUNTER — Other Ambulatory Visit: Payer: Self-pay

## 2018-07-23 DIAGNOSIS — O24439 Gestational diabetes mellitus in the puerperium, unspecified control: Secondary | ICD-10-CM

## 2018-07-24 LAB — GLUCOSE TOLERANCE, 2 HOURS
Glucose, 2 hour: 90 mg/dL (ref 65–139)
Glucose, GTT - Fasting: 69 mg/dL (ref 65–99)

## 2018-07-31 MED FILL — AMLODIPINE BESYLATE 5 MG TA: 5 | 15 days supply | Qty: 30 | Fill #1

## 2018-08-14 MED FILL — NORETHINDRONE 0.35 MG TAB: 0.35 | 28 days supply | Qty: 28 | Fill #1

## 2018-08-31 MED FILL — AMLODIPINE BESYLATE 5 MG TA: 5 | 15 days supply | Qty: 30 | Fill #2

## 2018-09-03 ENCOUNTER — Telehealth: Payer: Self-pay

## 2018-09-03 MED FILL — PENICILLIN VK 500 MG TABLET: 500 | 10 days supply | Qty: 20 | Fill #0

## 2018-09-03 NOTE — Telephone Encounter (Signed)
Pt called reg labs in September regarding Glucose results, pt states no one had called her. So gave results, pt verbalized understanding using Spanish Interpreter.

## 2018-09-10 MED FILL — NORETHINDRONE 0.35 MG TAB: 0.35 | 28 days supply | Qty: 28 | Fill #2

## 2018-10-02 MED FILL — AMLODIPINE BESYLATE 5 MG TA: 5 | 30 days supply | Qty: 30 | Fill #0

## 2018-10-09 MED FILL — NORETHINDRONE 0.35 MG TAB: 0.35 | 28 days supply | Qty: 28 | Fill #3

## 2018-10-30 MED FILL — AMLODIPINE BESYLATE 5 MG TA: 5 | 30 days supply | Qty: 30 | Fill #1

## 2018-11-09 MED FILL — NORLYDA 0.35 MG TABS: 0.35 | 28 days supply | Qty: 28 | Fill #4

## 2018-12-01 MED FILL — AMLODIPINE BESYLATE 5 MG TA: 5 | 30 days supply | Qty: 30 | Fill #2

## 2018-12-08 MED FILL — NORLYDA 0.35 MG TABS: 0.35 | 28 days supply | Qty: 28 | Fill #5

## 2018-12-31 MED FILL — AMLODIPINE BESYLATE 5 MG TA: 5 | 30 days supply | Qty: 30 | Fill #3 | Status: TO

## 2018-12-31 MED FILL — NORLYDA 0.35 MG TABS: 0.35 | 28 days supply | Qty: 28 | Fill #6 | Status: TO

## 2019-01-18 MED FILL — AMLODIPINE BESYLATE 5 MG TA: 5 | 30 days supply | Qty: 30 | Fill #0 | Status: TO

## 2019-01-18 MED FILL — NORETHINDRONE 0.35 MG TAB: 0.35 | 28 days supply | Qty: 28 | Fill #0 | Status: TO

## 2019-05-21 ENCOUNTER — Other Ambulatory Visit: Payer: Self-pay | Admitting: Student

## 2019-12-29 IMAGING — US US MFM OB FOLLOW-UP
1 series · 12 of 28 positions shown · non-contrast
Comparison: none

[Series 1: us mfm ob follow-up · 47 acquisitions, 12 frames shown]
[im 2/47]
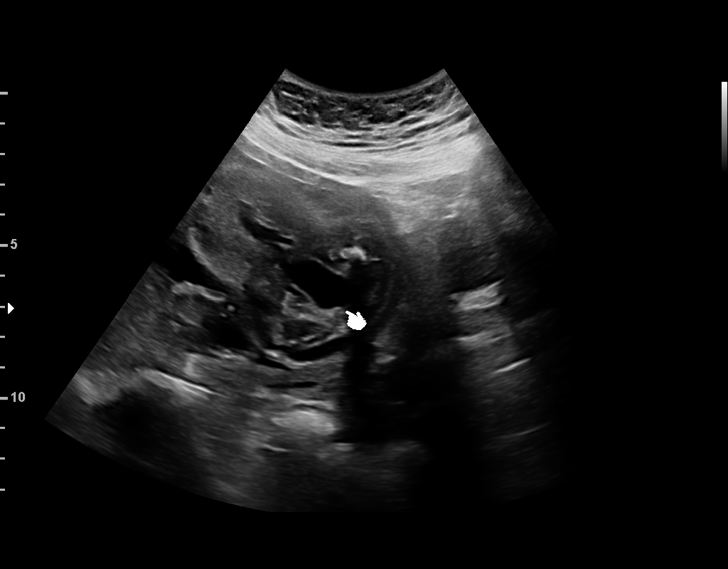
[im 6/47]
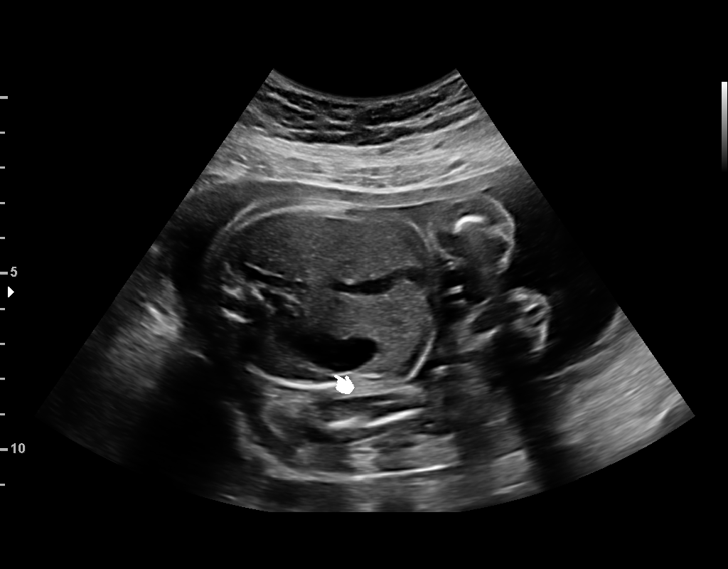
[im 9/47]
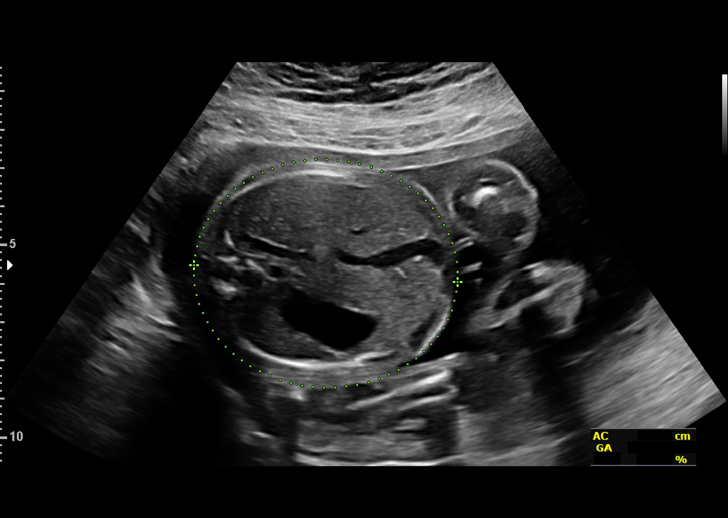
[im 14/47]
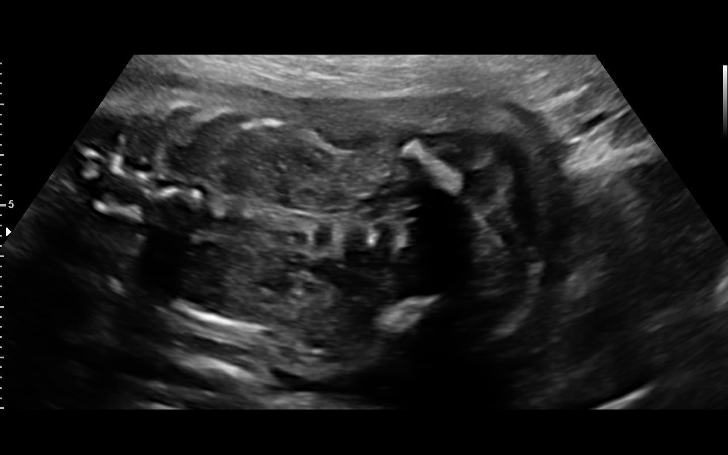
[im 18/47]
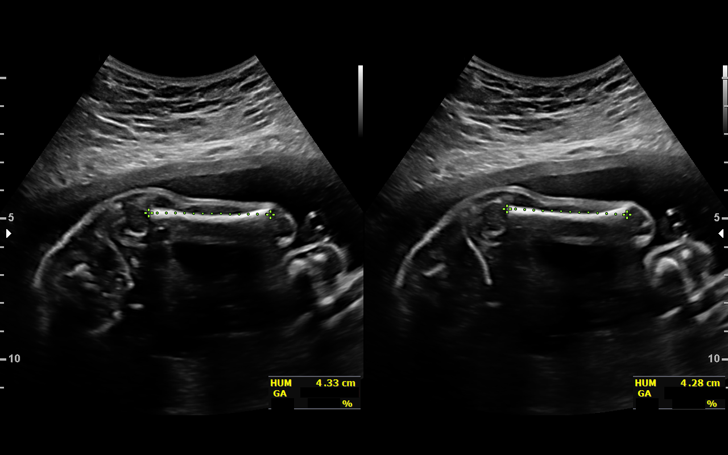
[im 21/47]
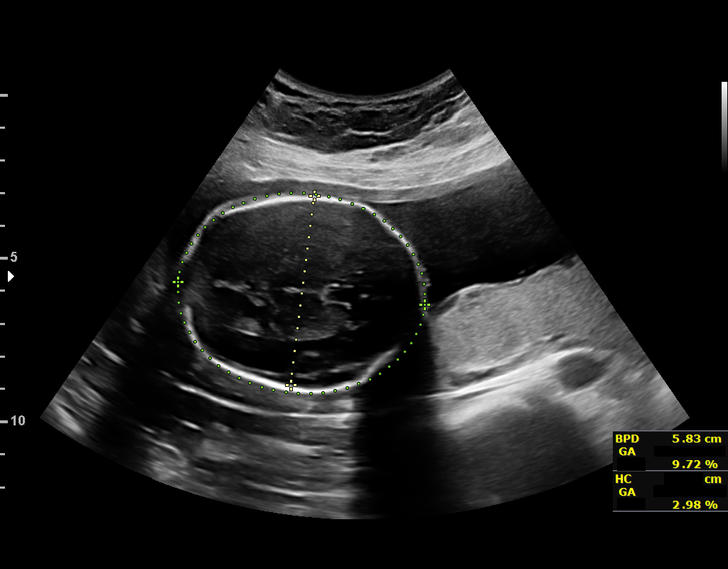
[im 26/47]
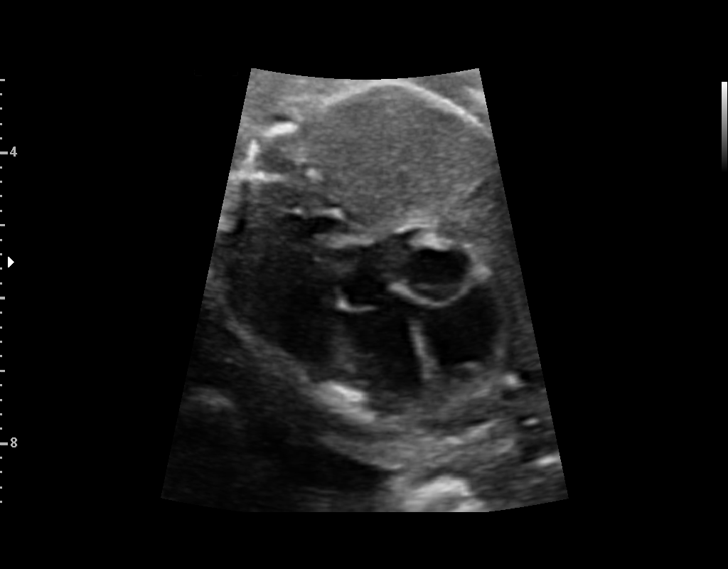
[im 29/47]
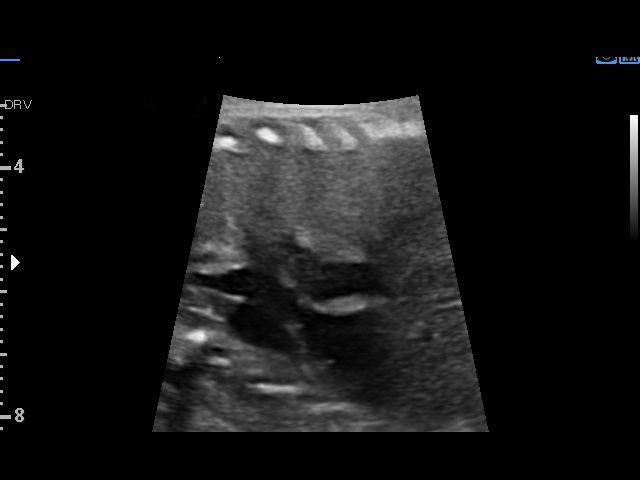
[im 33/47]
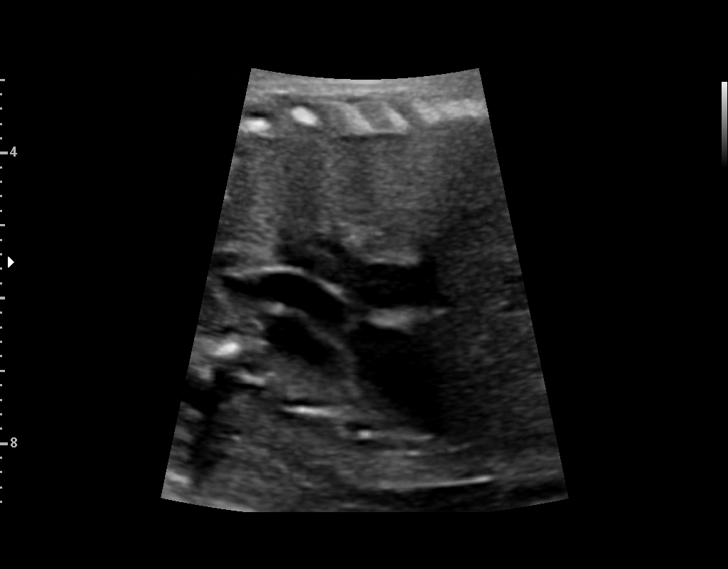
[im 38/47]
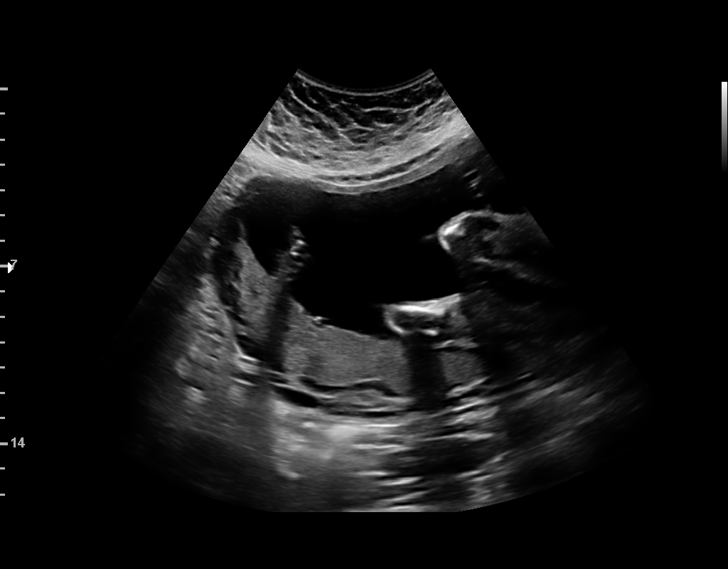
[im 41/47]
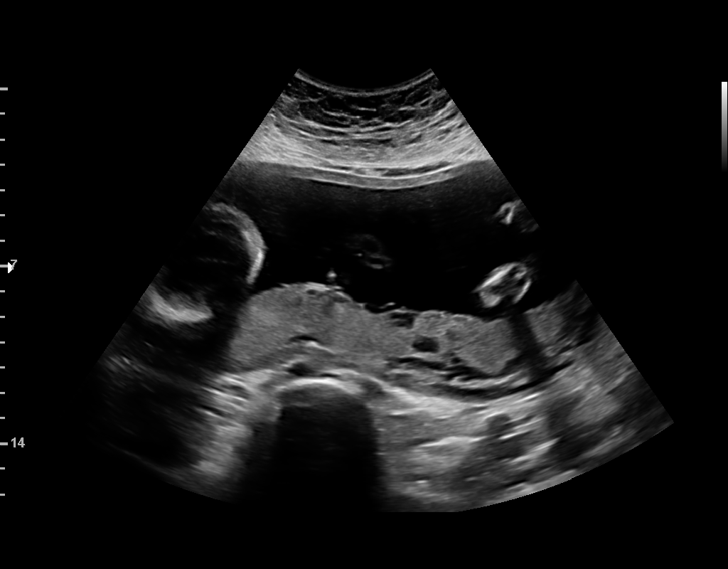
[im 45/47]
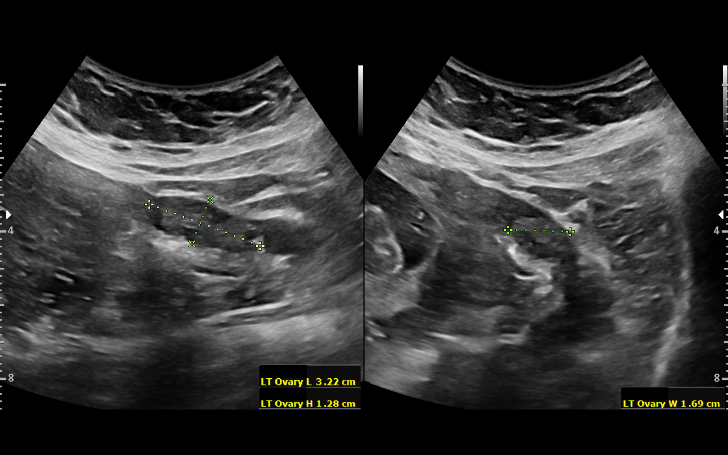

[12 of 28 positions shown; findings below may reference images not displayed]

1  HARKAITZ KASMI            [id number]      [id number]     [id number]
Indications

25 weeks gestation of pregnancy
Pre-existing diabetes, type 2, in pregnancy,
second trimester (diet)
Hypertension - Chronic/Pre-existing
([place])
Obesity complicating pregnancy, second
trimester
Encounter for other antenatal screening
follow-up
OB History

Blood Type:            Height:  5'2"   Weight (lb):  187       BMI:
Gravidity:    3         Term:   2
Living:       2
Fetal Evaluation

Num Of Fetuses:     1
Fetal Heart         143
Rate(bpm):
Cardiac Activity:   Observed
Presentation:       Breech
Placenta:           Posterior, above cervical os
P. Cord Insertion:  Previously Visualized

Amniotic Fluid
AFI FV:      Subjectively within normal limits

Largest Pocket(cm)
5.87
Biometry

BPD:      57.8  mm     G. Age:  23w 5d          7  %    CI:        71.48   %    70 - 86
FL/HC:      20.6   %    18.7 -
HC:      217.7  mm     G. Age:  23w 5d          4  %    HC/AC:      1.08        1.04 -
AC:      202.1  mm     G. Age:  24w 6d         36  %    FL/BPD:     77.7   %    71 - 87
FL:       44.9  mm     G. Age:  24w 6d         31  %    FL/AC:      22.2   %    20 - 24
HUM:      42.9  mm     G. Age:  25w 5d         60  %

Est. FW:     721  gm      1 lb 9 oz     46  %
Gestational Age

LMP:           25w 0d        Date:  09/04/17                 EDD:   06/11/18
U/S Today:     24w 2d                                        EDD:   06/16/18
Best:          25w 0d     Det. By:  LMP  (09/04/17)          EDD:   06/11/18
Anatomy

Cranium:               Appears normal         Aortic Arch:            Previously seen
Cavum:                 Appears normal         Ductal Arch:            Previously seen
Ventricles:            Appears normal         Diaphragm:              Previously seen
Choroid Plexus:        Previously seen        Stomach:                Appears normal, left
sided
Cerebellum:            Previously seen        Abdomen:                Appears normal
Posterior Fossa:       Previously seen        Abdominal Wall:         Previously seen
Nuchal Fold:           Previously seen        Cord Vessels:           Previously seen
Face:                  Orbits and profile     Kidneys:                Appear normal
previously seen
Lips:                  Previously seen        Bladder:                Appears normal
Thoracic:              Appears normal         Spine:                  Previously seen
Heart:                 Appears normal         Upper Extremities:      Previously seen
(4CH, axis, and
situs)
RVOT:                  Previously seen        Lower Extremities:      Previously seen
LVOT:                  Appears normal

Other:  Fetus appears to be a male. Heels and 5th digit prev visualized.
Nasal bone prev visualized. Technically difficult due to maternal
habitus and fetal position.
Cervix Uterus Adnexa

Cervix
Length:           4.02  cm.
Normal appearance by transabdominal scan.

Uterus
No abnormality visualized.

Left Ovary
Size(cm)       3.2  x   1.3    x  1.7       Vol(ml):
Within normal limits.

Right Ovary
Size(cm)        3   x   1.5    x  2.3       Vol(ml):
Within normal limits.
Impression

Singleton intrauterine pregnancy at 25+0 weeks with CHTN
here for growth evaluation
Interval review of the anatomy shows no sonographic
markers for aneuploidy or structural anomalies
All relevant fetal anatomy has been visualized
Amniotic fluid volume is normal
Estimated fetal weight shows growth in the 46th percentile
Recommendations

Recommend follow-up ultrasound examination in 4 weeks for
growth evaluation due to CHTN

## 2020-02-29 IMAGING — US US FETAL BPP W/ NON-STRESS
1 series · 11 of 11 positions shown · non-contrast
Comparison: none

[Series 1: us fetal bpp w/nonstress · 11 acquisitions, 11 frames shown]
[im 1/11]
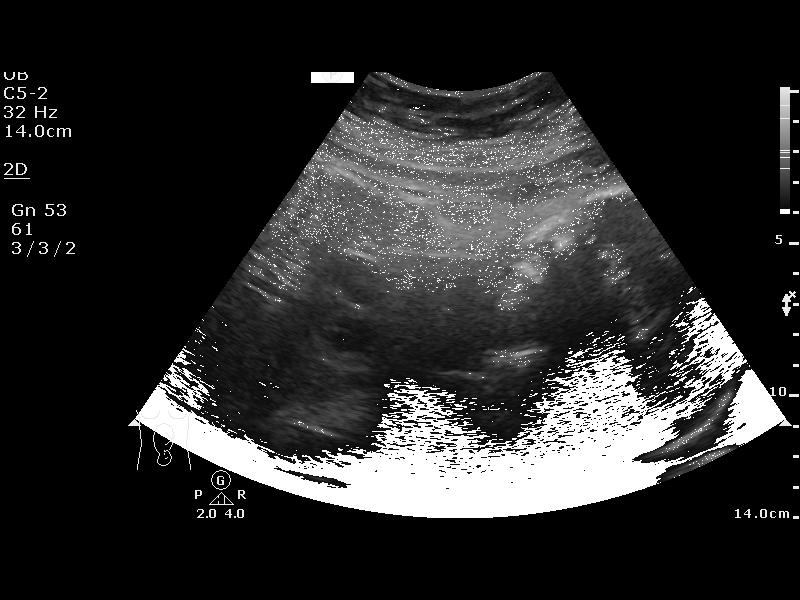
[im 2/11]
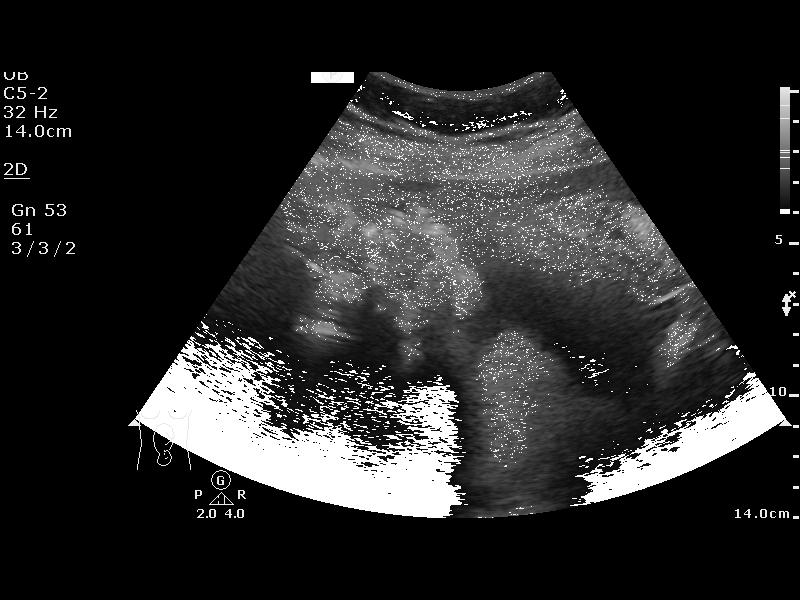
[im 3/11]
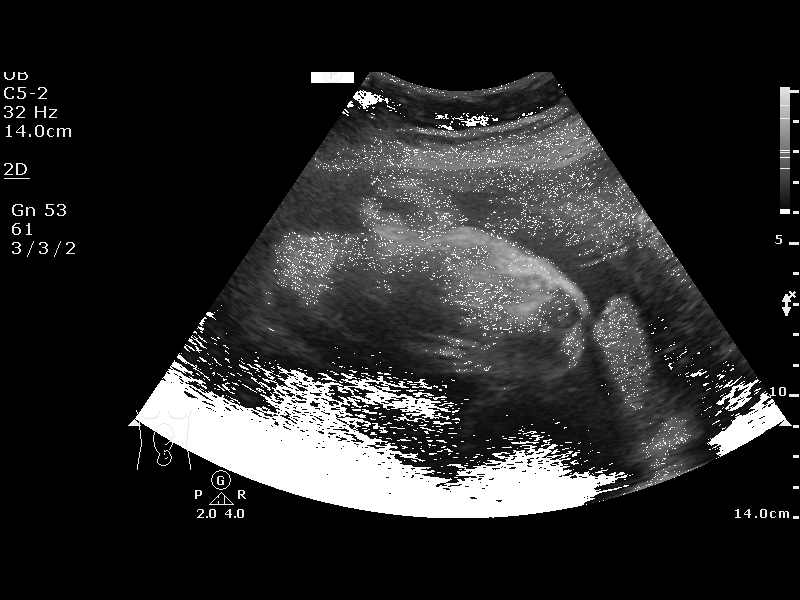
[im 4/11]
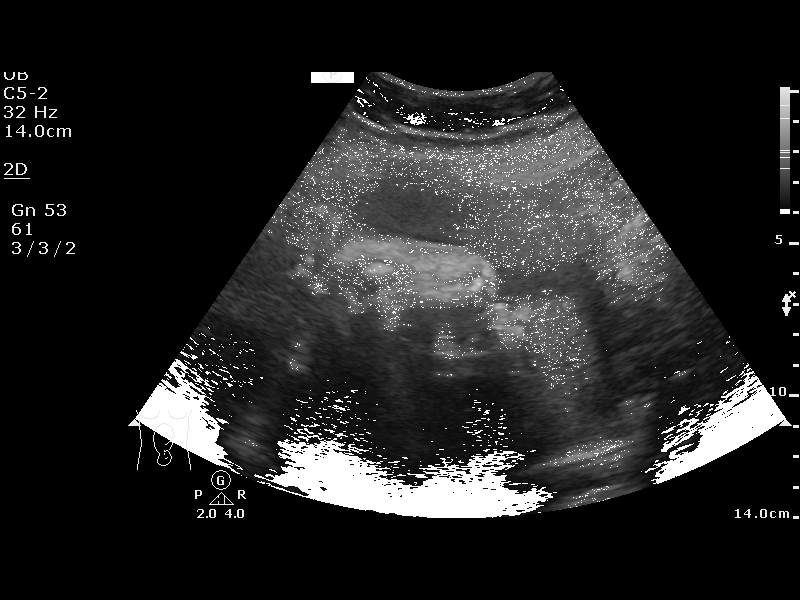
[im 5/11]
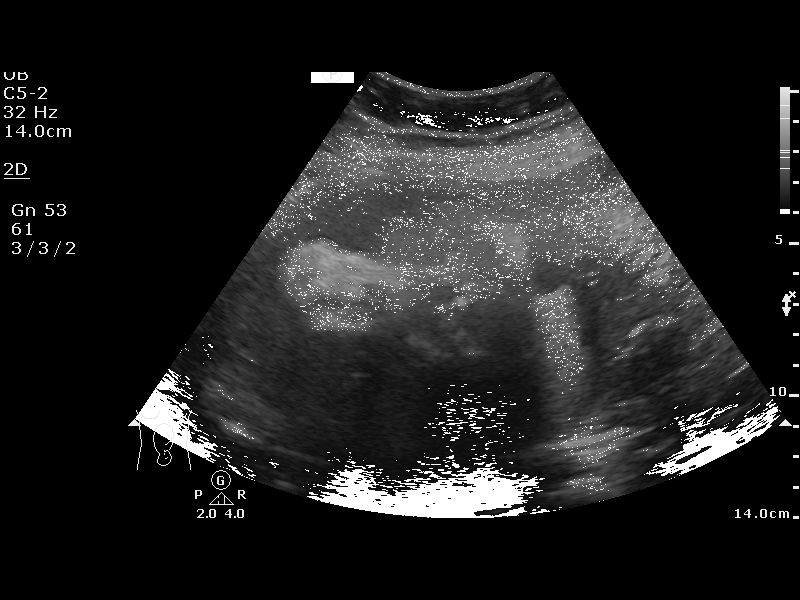
[im 6/11]
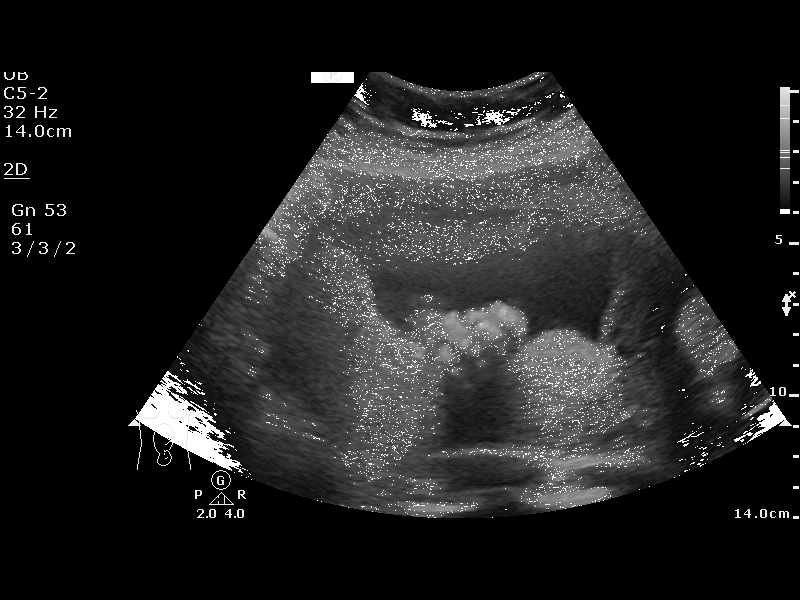
[im 7/11]
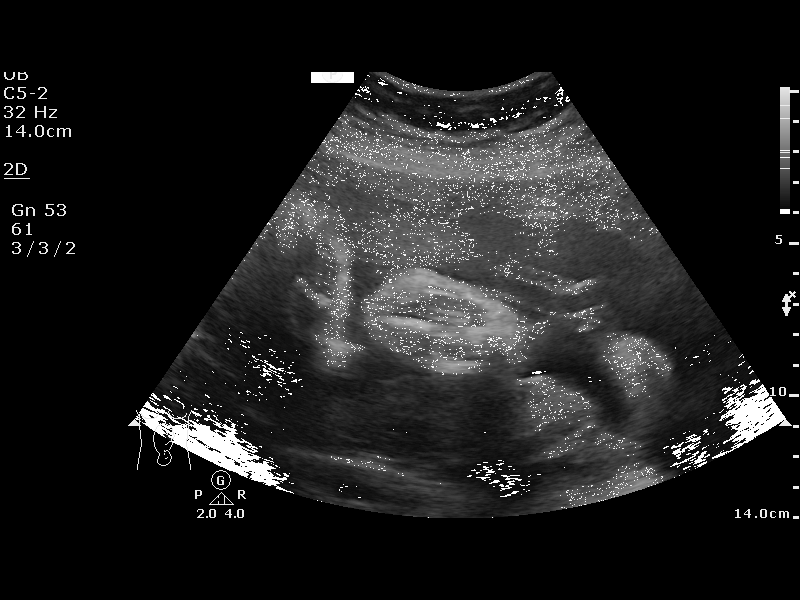
[im 8/11]
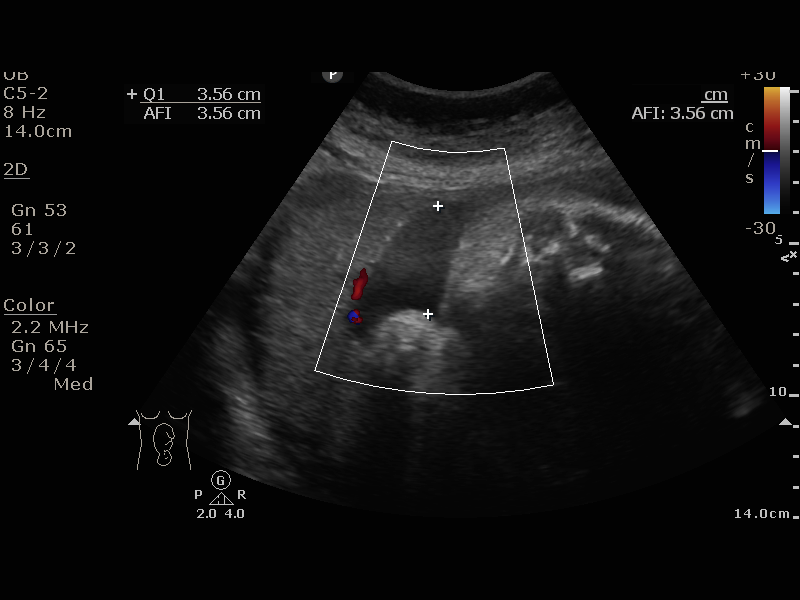
[im 9/11]
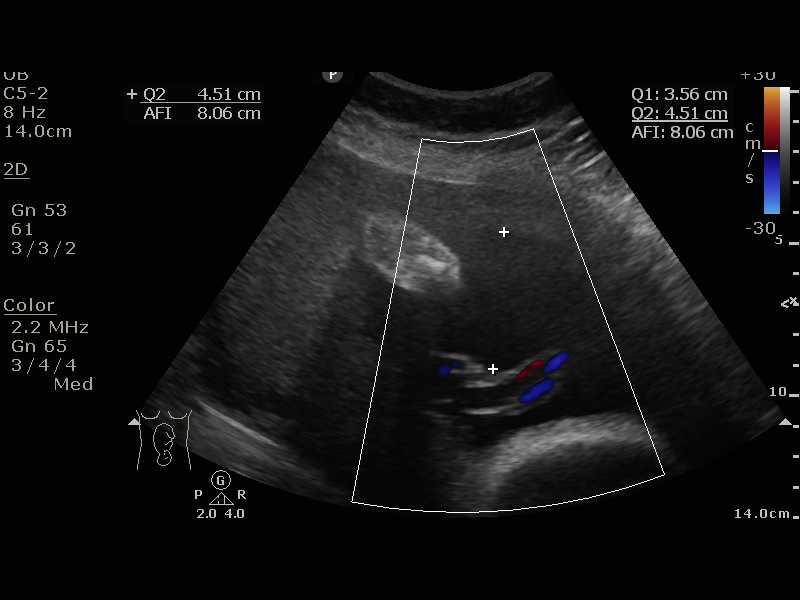
[im 10/11]
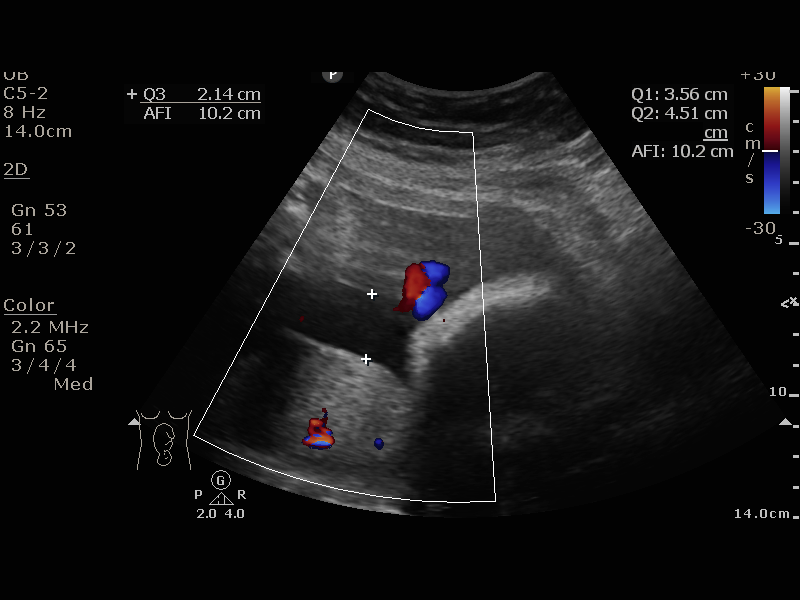
[im 11/11]
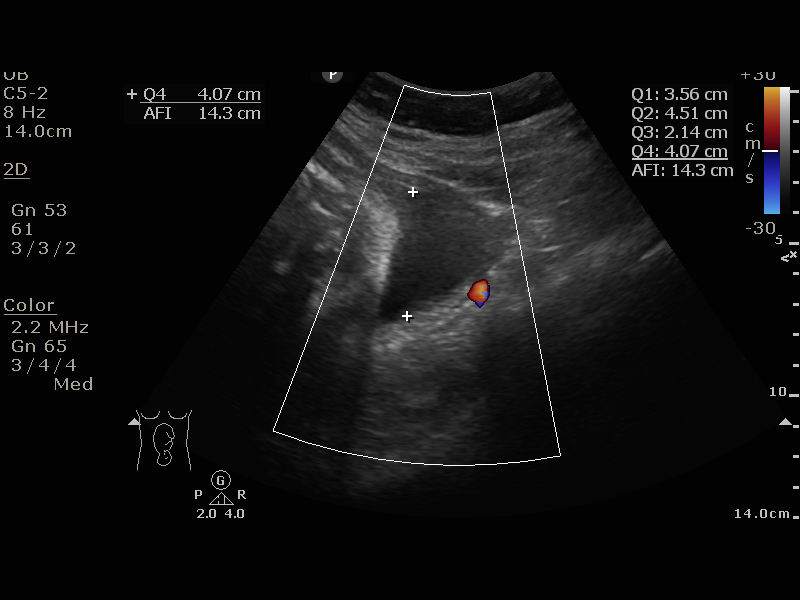

[11 of 11 positions shown; findings below may reference images not displayed]

am)

Attending:        Endy Hysa         [facility]         [facility]
[REDACTED]

1  US FETAL BPP W/NONSTRESS                    76818.4

1  CHRISSOULA SADULLAH           [id number]      [id number]     [id number]
Service(s) Provided

Indications

33 weeks gestation of pregnancy
Unspecified pre-existing hypertension
complicating pregnancy, third trimester
Gestational diabetes in pregnancy, diet
controlled
OB History

Blood Type:            Height:  5'2"   Weight (lb):  187       BMI:
Gravidity:    3         Term:   2
Living:       2
Fetal Evaluation

Num Of Fetuses:     1
Preg. Location:     Intrauterine
Cardiac Activity:   Observed
Presentation:       Cephalic

Amniotic Fluid
AFI FV:      Subjectively within normal limits

AFI Sum(cm)     %Tile       Largest Pocket(cm)
14.28           50
RUQ(cm)       RLQ(cm)       LUQ(cm)        LLQ(cm)
3.56
Biophysical Evaluation

Amniotic F.V:   Pocket => 2 cm two         F. Tone:        Observed
planes
F. Movement:    Observed                   N.S.T:          Reactive
F. Breathing:   Not Observed               Score:          [DATE]
Gestational Age

LMP:           33w 6d        Date:  09/04/17                 EDD:   06/11/18
Best:          33w 6d     Det. By:  LMP  (09/04/17)          EDD:   06/11/18
Impression

IUP at 55w3d
Normal amniotic fluid volume
Reassuring BPP of [DATE]
Recommendations

Continue weekly antenatal testing till delivery

## 2020-03-22 IMAGING — US US FETAL BPP W/ NON-STRESS
1 series · 13 of 14 positions shown · non-contrast
Comparison: none

[Series 1: us fetal bpp w/nonstress · 14 acquisitions, 13 frames shown]
[im 1/14]
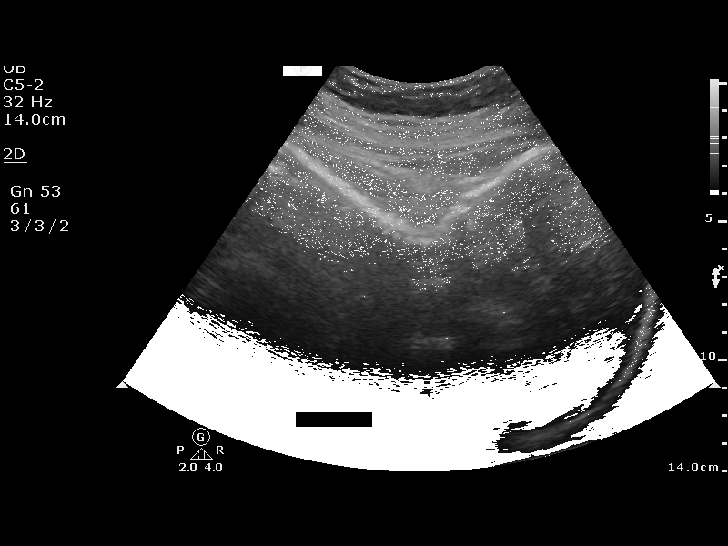
[im 2/14]
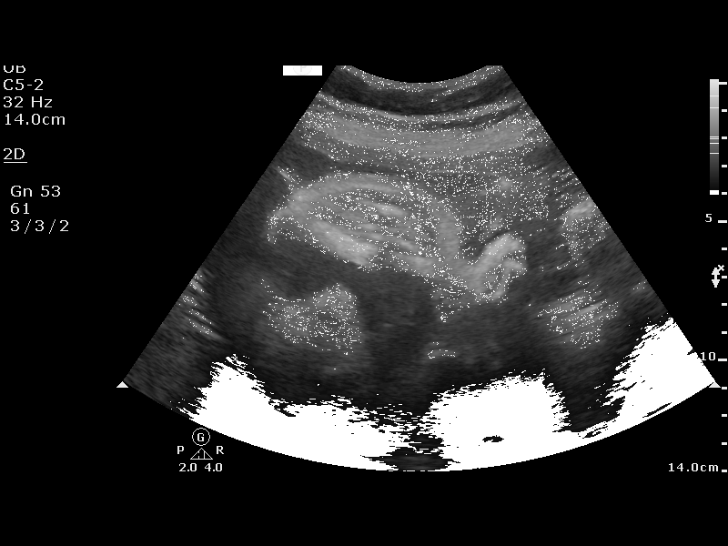
[im 3/14]
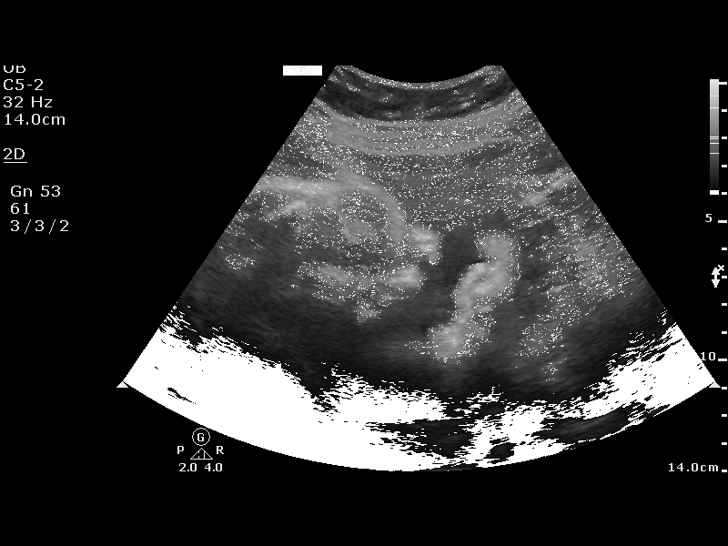
[im 4/14]
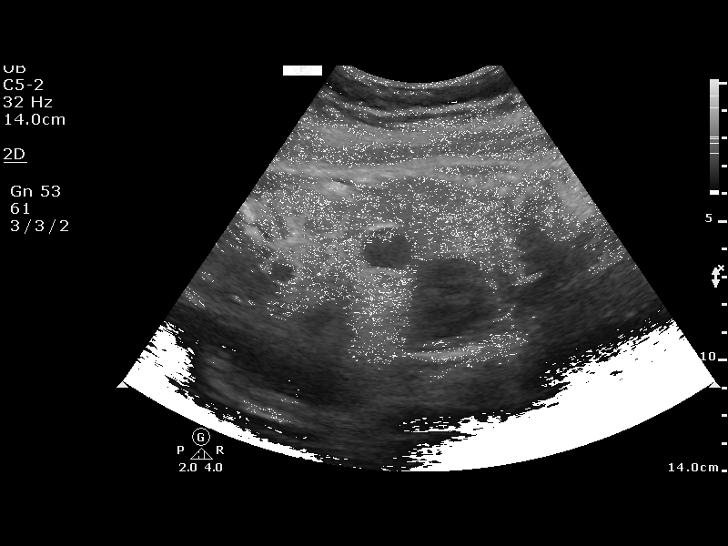
[im 5/14]
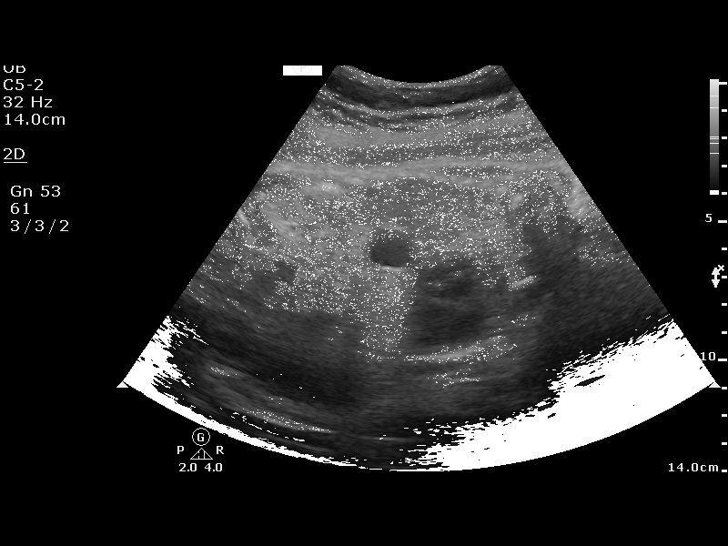
[im 6/14]
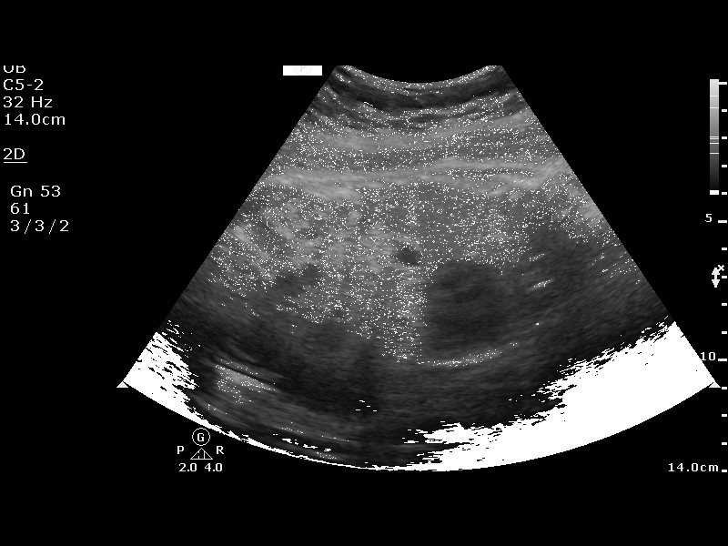
[im 8/14]
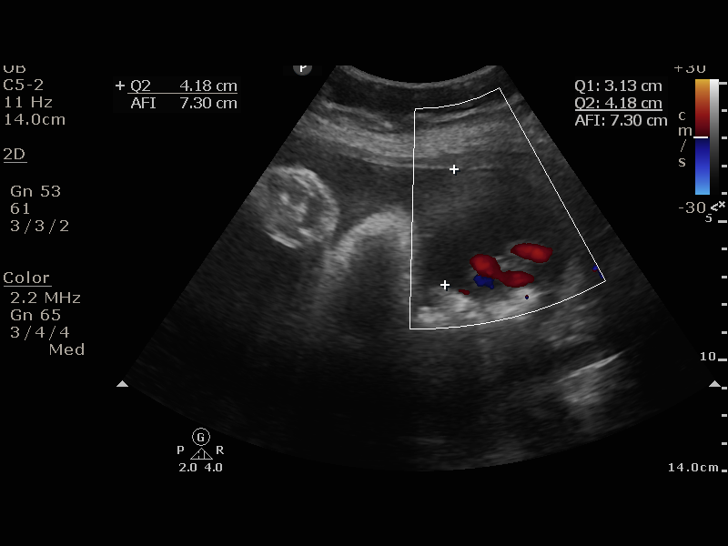
[im 9/14]
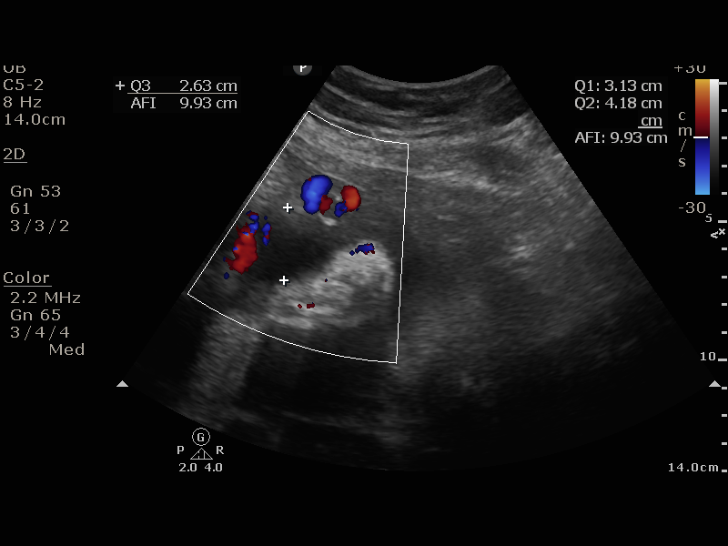
[im 10/14]
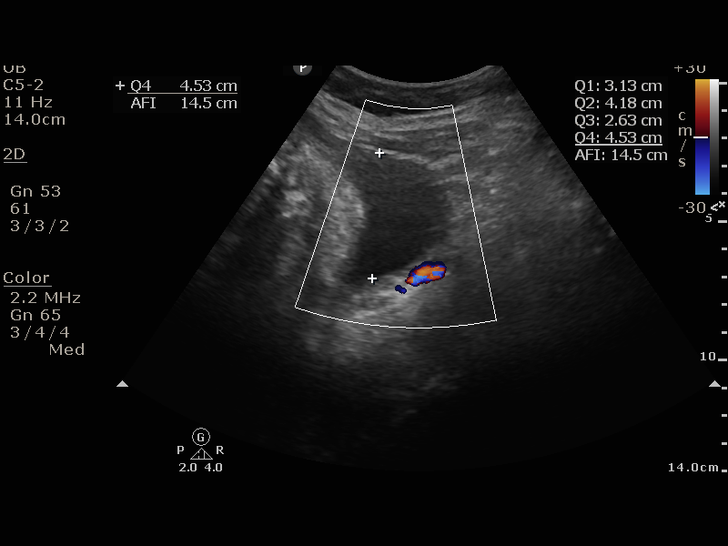
[im 11/14]
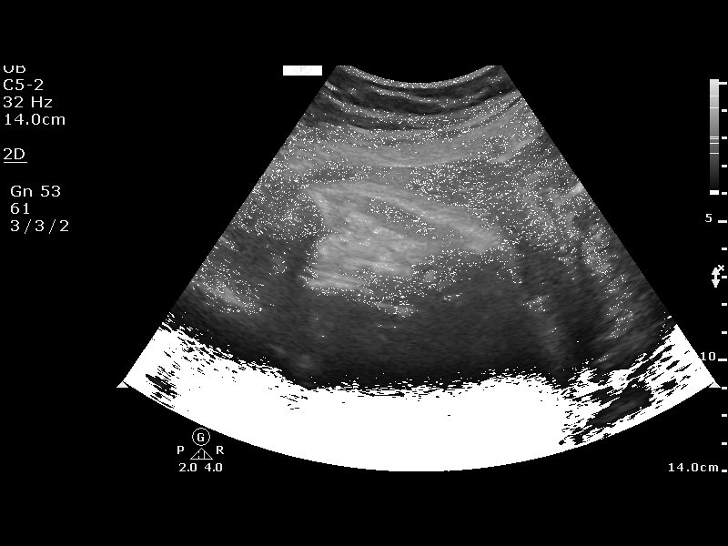
[im 12/14]
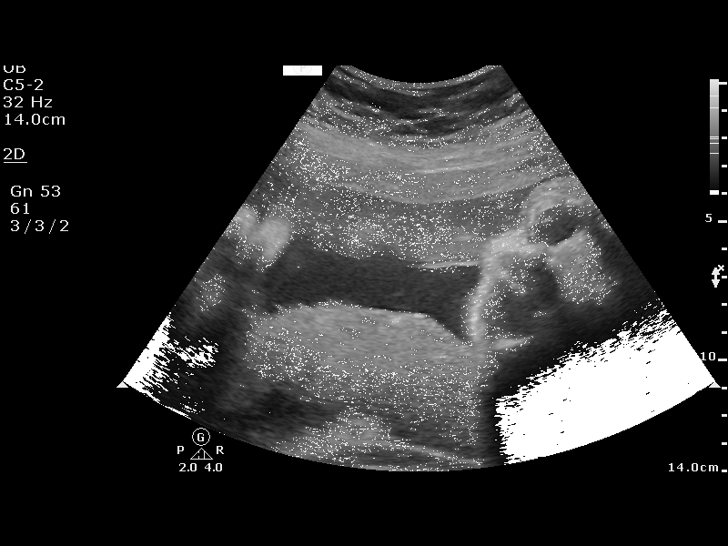
[im 13/14]
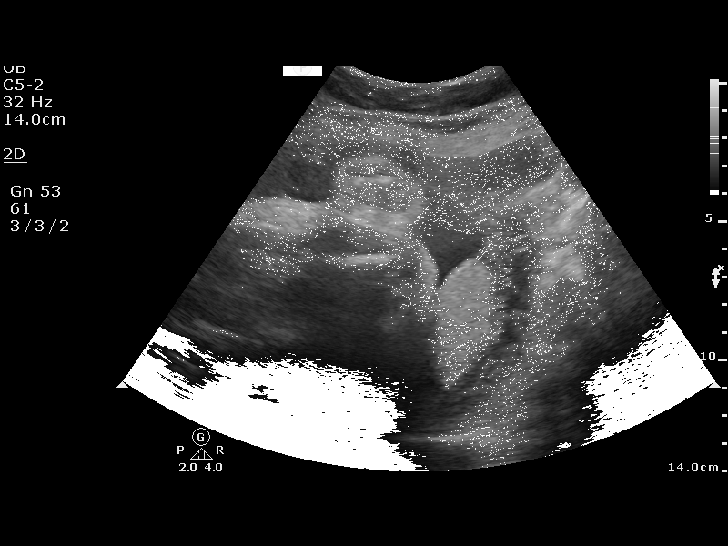
[im 14/14]
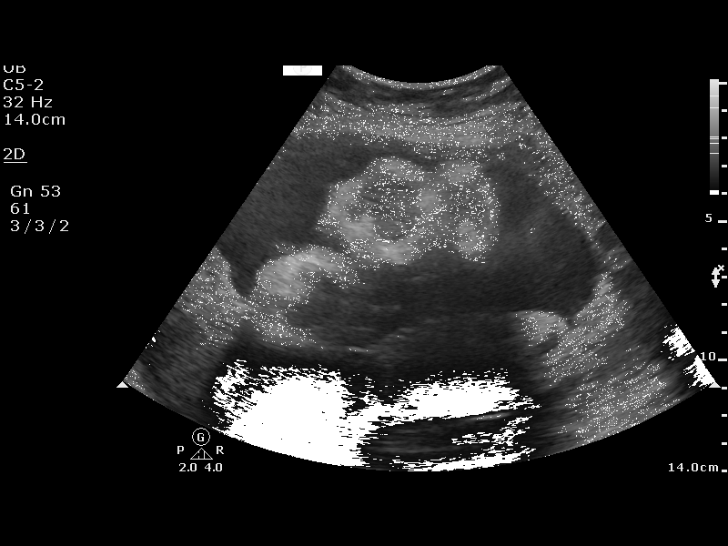

[13 of 14 positions shown; findings below may reference images not displayed]

[facility]
[REDACTED]

1  US FETAL BPP W/NONSTRESS                    76818.4

1  KETTY MOZ                [id number]      [id number]     [id number]
Service(s) Provided

Indications

37 weeks gestation of pregnancy
Unspecified pre-existing hypertension
complicating pregnancy, third trimester
OB History

Blood Type:            Height:  5'2"   Weight (lb):  187       BMI:
Gravidity:    3         Term:   2
Living:       2
Fetal Evaluation

Num Of Fetuses:     1
Preg. Location:     Intrauterine
Cardiac Activity:   Observed
Presentation:       Cephalic

Amniotic Fluid
AFI FV:      Subjectively within normal limits

AFI Sum(cm)     %Tile       Largest Pocket(cm)
14.47           54
RUQ(cm)       RLQ(cm)       LUQ(cm)        LLQ(cm)
3.13
Biophysical Evaluation

Amniotic F.V:   Pocket => 2 cm two         F. Tone:        Observed
planes
F. Movement:    Observed                   N.S.T:          Reactive
F. Breathing:   Observed                   Score:          [DATE]
Gestational Age

LMP:           37w 0d        Date:  09/04/17                 EDD:   06/11/18
Best:          37w 0d     Det. By:  LMP  (09/04/17)          EDD:   06/11/18
Impression

Normal amniotic fluid volume
BPP reassuring at [DATE]
Recommendations

Continue weekly antenatal testing till delivery

## 2021-03-12 ENCOUNTER — Other Ambulatory Visit: Payer: Self-pay

## 2021-03-12 ENCOUNTER — Encounter (HOSPITAL_COMMUNITY): Payer: Self-pay

## 2021-03-12 ENCOUNTER — Ambulatory Visit (HOSPITAL_COMMUNITY)
Admission: EM | Admit: 2021-03-12 | Discharge: 2021-03-12 | Disposition: A | Discharge status: Home / Self Care | Payer: Self-pay | Attending: Internal Medicine | Admitting: Internal Medicine

## 2021-03-12 DIAGNOSIS — Z1152 Encounter for screening for COVID-19: Secondary | ICD-10-CM

## 2021-03-12 DIAGNOSIS — J069 Acute upper respiratory infection, unspecified: Secondary | ICD-10-CM

## 2021-03-12 MED ORDER — BENZONATATE 100 MG PO CAPS
100.0000 mg | ORAL_CAPSULE | Freq: Three times a day (TID) | ORAL | 0 refills | Status: AC | PRN
Start: 2021-03-12 — End: 2022-03-12

## 2021-03-12 NOTE — Discharge Instructions (Signed)
COVID testing ordered. It will take between 1-2 days for test results. Someone will contact you regarding abnormal results or you can access your results through MyChart.   In the meantime you should... Remain isolated in your home for 5 days from symptom onset AND greater than 48 hours of no fever without the use of fever-reducing medication Get plenty of rest and fluids Tessalon Perles prescribed for cough Flonase (or fluticasone) for nasal congestion and/or runny nose You can take OTC Zyrtec-D for nasal congestion, runny nose, and/or sore throat Use these medications as directed for symptom relief Use Tylenol or Ibuprofen as needed for fever or pain Return or go to the ER for any worsening or new symptoms such as high fever, worsening cough, shortness of breath, chest tightness, chest pain, changes in mental status, etc.

## 2021-03-12 NOTE — ED Triage Notes (Signed)
Pt c/o itchiness in throat and fever X 3 days. Pt states she has taken ibuprofen to reduce the fever. She states she has developed a headache.

## 2021-03-12 NOTE — ED Notes (Signed)
Pt refused MA to take BP.

## 2021-03-12 NOTE — ED Provider Notes (Signed)
[facility]    CSN: [id number] Arrival date & time: 03/12/21  1409      History   Chief Complaint Chief Complaint  Patient presents with  . Sore Throat  . Fever  . Headache    HPI Michelle Christian is a 35 y.o. female presents to urgent care with complaints of sore throat, fever and headache x3 days.  Of note, patient being seen with multiple family members with similar symptoms.  Patient states she last took Motrin this morning for fever.  She denies any dizziness, chest pain, shortness of breath, abdominal pain, N/V/D, or dysuria.   Past Medical History:  Diagnosis Date  . Abnormal LFTs (liver function tests) 10/02/2011   09/30/2011-70/122, then normal 18/19 on 11/18/11   . Depression 01/2013  . Gestational diabetes   . Hemorrhoids during pregnancy   . History of postpartum depression, currently pregnant   . Hypertension     Patient Active Problem List   Diagnosis Date Noted  . Obesity in pregnancy 05/21/2018  . Language barrier 02/05/2018  . Major depression, recurrent (HCC) 03/10/2013  . Anxiety state, unspecified 03/10/2013  . Hypertension 09/13/2011    Past Surgical History:  Procedure Laterality Date  . NO PAST SURGERIES      OB History    Gravida  3   Para  3   Term  3   Preterm      AB      Living  3     SAB      IAB      Ectopic      Multiple  0   Live Births  3            Home Medications    Prior to Admission medications   Medication Sig Start Date End Date Taking? Authorizing Provider  benzonatate (TESSALON PERLES) 100 MG capsule Take 1 capsule (100 mg total) by mouth 3 (three) times daily as needed for cough. 03/12/21 03/12/22 Yes Rolla Etienne, NP  amLODipine (NORVASC) 5 MG tablet Take 2 tablets (10 mg total) by mouth daily. 06/06/18 09/04/18  Arabella Merles, CNM  ferrous sulfate 325 (65 FE) MG EC tablet Take 1 tablet (325 mg total) by mouth 2 (two) times daily with a meal. 03/20/18   Degele, Kandra Nicolas, MD   norethindrone (MICRONOR) 0.35 MG tablet TAKE ONE TABLET BY MOUTH ONCE DAILY 05/27/19   Marylene Land, CNM  Prenatal Vit-Fe Fumarate-FA (PRENATAL 19) tablet Chew 1 tablet by mouth daily. 01/15/18   Reva Bores, MD    Family History Family History  Problem Relation Age of Onset  . Hypertension Maternal Grandmother   . Hypertension Maternal Grandfather   . Anesthesia problems Neg Hx   . Hypotension Neg Hx   . Malignant hyperthermia Neg Hx   . Pseudochol deficiency Neg Hx     Social History Social History   Tobacco Use  . Smoking status: Never Smoker  . Smokeless tobacco: Never Used  Substance Use Topics  . Alcohol use: No  . Drug use: No     Allergies   Patient has no known allergies.   Review of Systems As stated in HPI otherwise negative   Physical Exam Triage Vital Signs ED Triage Vitals  Enc Vitals Group     BP --      Pulse Rate 03/12/21 1610 (!) 108     Resp 03/12/21 1610 18     Temp 03/12/21 1610 98.6 F (37  C)     Temp Source 03/12/21 1610 Oral     SpO2 03/12/21 1610 98 %     Weight --      Height --      Head Circumference --      Peak Flow --      Pain Score 03/12/21 1601 5     Pain Loc --      Pain Edu? --      Excl. in GC? --    No data found.  Updated Vital Signs Pulse (!) 108   Temp 98.6 F (37 C) (Oral)   Resp 18   LMP 02/10/2021 (Exact Date)   SpO2 98%   Visual Acuity Right Eye Distance:   Left Eye Distance:   Bilateral Distance:    Right Eye Near:   Left Eye Near:    Bilateral Near:     Physical Exam Constitutional:      General: She is not in acute distress.    Appearance: She is well-developed. She is obese. She is not ill-appearing or toxic-appearing.  HENT:     Right Ear: No middle ear effusion. Tympanic membrane is not erythematous.     Left Ear:  No middle ear effusion. Tympanic membrane is not erythematous.     Nose: Congestion present.     Mouth/Throat:     Mouth: Mucous membranes are moist. No  oral lesions.     Pharynx: Uvula midline. No pharyngeal swelling or oropharyngeal exudate.     Tonsils: No tonsillar exudate or tonsillar abscesses.     Comments: Mild posterior pharyngeal erythema Eyes:     Conjunctiva/sclera: Conjunctivae normal.  Cardiovascular:     Rate and Rhythm: Normal rate and regular rhythm.     Heart sounds: Normal heart sounds. No murmur heard. No friction rub. No gallop.   Pulmonary:     Effort: Pulmonary effort is normal.     Breath sounds: Normal breath sounds. No wheezing, rhonchi or rales.  Abdominal:     General: Bowel sounds are normal.     Palpations: Abdomen is soft.  Musculoskeletal:     Cervical back: Normal range of motion and neck supple.  Lymphadenopathy:     Cervical: No cervical adenopathy.  Skin:    General: Skin is warm and dry.  Neurological:     Mental Status: She is alert.      UC Treatments / Results  Labs (all labs ordered are listed, but only abnormal results are displayed) Labs Reviewed  SARS CORONAVIRUS 2 (TAT 6-24 HRS)    EKG   Radiology No results found.  Procedures Procedures (including critical care time)  Medications Ordered in UC Medications - No data to display  Initial Impression / Assessment and Plan / UC Course  I have reviewed the triage vital signs and the nursing notes.  Pertinent labs & imaging results that were available during my care of the patient were reviewed by me and considered in my medical decision making (see chart for details).  Viral URI  Screening for Covid -Exam and history consistent with acute viral illness.  Highly suspicious for COVID-19 given multiple family members with similar symptoms being treated at same time -Exam benign with no evidence of bacterial etiology. -Tessalon Perles as needed, cetirizine, Flonase -Isolation and strict follow-up precautions discussed  Reviewed expections re: course of current medical issues. Questions answered. Outlined signs and symptoms  indicating need for more acute intervention. Pt verbalized understanding. AVS given  Final Clinical Impressions(s) /  UC Diagnoses   Final diagnoses:  Viral upper respiratory tract infection  Encounter for screening for COVID-19     Discharge Instructions     COVID testing ordered. It will take between 1-2 days for test results. Someone will contact you regarding abnormal results or you can access your results through MyChart.   In the meantime you should... . Remain isolated in your home for 5 days from symptom onset AND greater than 48 hours of no fever without the use of fever-reducing medication . Get plenty of rest and fluids . Tessalon Perles prescribed for cough . Flonase (or fluticasone) for nasal congestion and/or runny nose . You can take OTC Zyrtec-D for nasal congestion, runny nose, and/or sore throat . Use these medications as directed for symptom relief . Use Tylenol or Ibuprofen as needed for fever or pain . Return or go to the ER for any worsening or new symptoms such as high fever, worsening cough, shortness of breath, chest tightness, chest pain, changes in mental status, etc.     ED Prescriptions    Medication Sig Dispense Auth. Provider   benzonatate (TESSALON PERLES) 100 MG capsule Take 1 capsule (100 mg total) by mouth 3 (three) times daily as needed for cough. 30 capsule Rolla Etienne, NP     PDMP not reviewed this encounter.   Rolla Etienne, NP 03/12/21 [phone]

## 2021-03-13 LAB — SARS CORONAVIRUS 2 (TAT 6-24 HRS): SARS Coronavirus 2: POSITIVE — AB

## 2021-04-04 ENCOUNTER — Ambulatory Visit (HOSPITAL_COMMUNITY): Admission: EM | Admit: 2021-04-04 | Discharge: 2021-04-04 | Discharge status: Against Medical Advice | Payer: Self-pay

## 2022-11-02 ENCOUNTER — Encounter (HOSPITAL_COMMUNITY): Payer: Self-pay | Admitting: Emergency Medicine

## 2022-11-02 ENCOUNTER — Ambulatory Visit (HOSPITAL_COMMUNITY)
Admission: EM | Admit: 2022-11-02 | Discharge: 2022-11-02 | Disposition: A | Discharge status: Home / Self Care | Payer: Self-pay | Attending: Physician Assistant | Admitting: Physician Assistant

## 2022-11-02 DIAGNOSIS — J029 Acute pharyngitis, unspecified: Secondary | ICD-10-CM | POA: Insufficient documentation

## 2022-11-02 DIAGNOSIS — J069 Acute upper respiratory infection, unspecified: Secondary | ICD-10-CM | POA: Insufficient documentation

## 2022-11-02 DIAGNOSIS — R051 Acute cough: Secondary | ICD-10-CM | POA: Insufficient documentation

## 2022-11-02 LAB — POCT RAPID STREP A, ED / UC: Streptococcus, Group A Screen (Direct): NEGATIVE

## 2022-11-02 MED ORDER — DM-GUAIFENESIN ER 30-600 MG PO TB12: 1.0000 | ORAL_TABLET | Freq: Two times a day (BID) | ORAL | 0 refills | Status: AC

## 2022-11-02 NOTE — ED Triage Notes (Signed)
Used interpretor  Pt c/o cough, and sore throat that started thursday. feels like phlegm stuck in back of throat. Took ibuprofen last night

## 2022-11-02 NOTE — Discharge Instructions (Signed)
This is a upper respiratory and viral infection along with a viral sore throat.  Advised to continue taking ibuprofen for fever or bodyaches.  Also advised using some lozenges to help soothe the throat. Mucinex DM every 12 hours to help control cough congestion.  This prescription was sent to the pharmacy. Advised follow-up PCP or return to urgent care if symptoms fail to improve over the next 7 days.

## 2022-11-02 NOTE — ED Provider Notes (Signed)
[facility]    CSN: [id number] Arrival date & time: 11/02/22  1032      History   Chief Complaint Chief Complaint  Patient presents with   Cough   Sore Throat    HPI Michelle Christian is a 37 y.o. female.   37 year old female presents with cough and sore throat.  History is being taken through interpretive services with Spanish being the language.  Patient indicates for the past several days she has been having increased sore throat and painful swallowing.  Patient indicates that several times she feels like that the phlegm was got stuck in her throat and gave her difficulty swallowing.  Patient also indicates that she has been having some chest congestion and cough with the production being thick and yellow.  She also indicates that she is having some mild upper respiratory congestion with rhinitis and some right ear discomfort and pressure.  Patient indicates she has also had some mild chills and fever.  Patient indicates she has been taking some ibuprofen for pain relief along with some OTC congestion medicine but this has not helped to relieve her symptoms.  She does indicate that she was around her sister who ended up having flu.  Patient denies any nausea or vomiting.   Cough Associated symptoms: rhinorrhea and sore throat   Sore Throat    Past Medical History:  Diagnosis Date   Abnormal LFTs (liver function tests) 10/02/2011   09/30/2011-70/122, then normal 18/19 on 11/18/11    Depression 01/2013   Gestational diabetes    Hemorrhoids during pregnancy    History of postpartum depression, currently pregnant    Hypertension     Patient Active Problem List   Diagnosis Date Noted   Obesity in pregnancy 05/21/2018   Language barrier 02/05/2018   Major depression, recurrent (HCC) 03/10/2013   Anxiety state, unspecified 03/10/2013   Hypertension 09/13/2011    Past Surgical History:  Procedure Laterality Date   NO PAST SURGERIES      OB History     Gravida  3    Para  3   Term  3   Preterm      AB      Living  3      SAB      IAB      Ectopic      Multiple  0   Live Births  3            Home Medications    Prior to Admission medications   Medication Sig Start Date End Date Taking? Authorizing Provider  dextromethorphan-guaiFENesin (MUCINEX DM) 30-600 MG 12hr tablet Take 1 tablet by mouth 2 (two) times daily. 11/02/22  Yes Ellsworth Lennox, PA-C  amLODipine (NORVASC) 5 MG tablet Take 2 tablets (10 mg total) by mouth daily. 06/06/18 09/04/18  Arabella Merles, CNM  ferrous sulfate 325 (65 FE) MG EC tablet Take 1 tablet (325 mg total) by mouth 2 (two) times daily with a meal. 03/20/18   Degele, Kandra Nicolas, MD  norethindrone (MICRONOR) 0.35 MG tablet TAKE ONE TABLET BY MOUTH ONCE DAILY 05/27/19   Marylene Land, CNM  Prenatal Vit-Fe Fumarate-FA (PRENATAL 19) tablet Chew 1 tablet by mouth daily. 01/15/18   Reva Bores, MD    Family History Family History  Problem Relation Age of Onset   Hypertension Maternal Grandmother    Hypertension Maternal Grandfather    Anesthesia problems Neg Hx    Hypotension Neg Hx  Malignant hyperthermia Neg Hx    Pseudochol deficiency Neg Hx     Social History Social History   Tobacco Use   Smoking status: Never   Smokeless tobacco: Never  Substance Use Topics   Alcohol use: No   Drug use: No     Allergies   Patient has no known allergies.   Review of Systems Review of Systems  HENT:  Positive for postnasal drip, rhinorrhea and sore throat.   Respiratory:  Positive for cough.      Physical Exam Triage Vital Signs ED Triage Vitals  Enc Vitals Group     BP 11/02/22 1059 (!) 135/94     Pulse Rate 11/02/22 1059 (!) 108     Resp 11/02/22 1059 18     Temp 11/02/22 1059 98.3 F (36.8 C)     Temp src --      SpO2 11/02/22 1059 98 %     Weight --      Height --      Head Circumference --      Peak Flow --      Pain Score 11/02/22 1057 8     Pain Loc --      Pain  Edu? --      Excl. in GC? --    No data found.  Updated Vital Signs BP (!) 135/94 (BP Location: Right Arm)   Pulse (!) 108   Temp 98.3 F (36.8 C)   Resp 18   LMP 10/29/2022   SpO2 98%   Visual Acuity Right Eye Distance:   Left Eye Distance:   Bilateral Distance:    Right Eye Near:   Left Eye Near:    Bilateral Near:     Physical Exam Constitutional:      Appearance: She is well-developed.  HENT:     Right Ear: Ear canal normal. Tympanic membrane is injected.     Left Ear: Ear canal normal. Tympanic membrane is injected.     Mouth/Throat:     Mouth: Mucous membranes are moist.     Pharynx: Uvula midline. Posterior oropharyngeal erythema present. No oropharyngeal exudate.  Cardiovascular:     Rate and Rhythm: Normal rate and regular rhythm.     Heart sounds: Normal heart sounds.  Pulmonary:     Effort: Pulmonary effort is normal.     Breath sounds: Normal breath sounds and air entry. No wheezing, rhonchi or rales.  Lymphadenopathy:     Cervical: No cervical adenopathy.  Neurological:     Mental Status: She is alert.      UC Treatments / Results  Labs (all labs ordered are listed, but only abnormal results are displayed) Labs Reviewed  CULTURE, GROUP A STREP [facility])  POCT RAPID STREP A, ED / UC    EKG   Radiology No results found.  Procedures Procedures (including critical care time)  Medications Ordered in UC Medications - No data to display  Initial Impression / Assessment and Plan / UC Course  I have reviewed the triage vital signs and the nursing notes.  Pertinent labs & imaging results that were available during my care of the patient were reviewed by me and considered in my medical decision making (see chart for details).    Plan: 1.  The upper respiratory tract infection will be treated with the following: A.  Mucinex DM every 12 hours to control cough and congestion. 2.  The acute cough will be treated with the following: A.  Mucinex DM  every 12 hours to control cough congestion. 3.  The sore throat will be treated with the following: A.  Patient advised to continue ibuprofen and lozenges to help control the sore throat and any fever or bodyaches. 4.  Patient advised follow-up PCP or return to urgent care if symptoms fail to improve. Final Clinical Impressions(s) / UC Diagnoses   Final diagnoses:  Viral upper respiratory tract infection  Sore throat  Acute cough     Discharge Instructions      This is a upper respiratory and viral infection along with a viral sore throat.  Advised to continue taking ibuprofen for fever or bodyaches.  Also advised using some lozenges to help soothe the throat. Mucinex DM every 12 hours to help control cough congestion.  This prescription was sent to the pharmacy. Advised follow-up PCP or return to urgent care if symptoms fail to improve over the next 7 days.     ED Prescriptions     Medication Sig Dispense Auth. Provider   dextromethorphan-guaiFENesin (MUCINEX DM) 30-600 MG 12hr tablet Take 1 tablet by mouth 2 (two) times daily. 20 tablet Nyoka Lint, PA-C      PDMP not reviewed this encounter.   Nyoka Lint, PA-C 11/02/22 1129

## 2022-11-02 NOTE — ED Triage Notes (Signed)
Called no answer

## 2022-11-03 LAB — CULTURE, GROUP A STREP (THRC)

## 2022-11-04 LAB — CULTURE, GROUP A STREP (THRC)

## 2022-11-05 LAB — CULTURE, GROUP A STREP (THRC)

## 2023-09-08 NOTE — Telephone Encounter (Signed)
error 

## 2023-09-10 ENCOUNTER — Encounter (HOSPITAL_COMMUNITY): Payer: Self-pay

## 2023-09-10 ENCOUNTER — Emergency Department (HOSPITAL_COMMUNITY)
Admission: EM | Admit: 2023-09-10 | Discharge: 2023-09-10 | Disposition: A | Discharge status: Home / Self Care | Payer: Self-pay | Attending: Emergency Medicine | Admitting: Emergency Medicine

## 2023-09-10 ENCOUNTER — Emergency Department (HOSPITAL_COMMUNITY): Payer: Self-pay

## 2023-09-10 ENCOUNTER — Other Ambulatory Visit: Payer: Self-pay

## 2023-09-10 DIAGNOSIS — Z79899 Other long term (current) drug therapy: Secondary | ICD-10-CM | POA: Insufficient documentation

## 2023-09-10 DIAGNOSIS — R519 Headache, unspecified: Secondary | ICD-10-CM | POA: Insufficient documentation

## 2023-09-10 DIAGNOSIS — R110 Nausea: Secondary | ICD-10-CM | POA: Insufficient documentation

## 2023-09-10 DIAGNOSIS — I10 Essential (primary) hypertension: Secondary | ICD-10-CM | POA: Insufficient documentation

## 2023-09-10 DIAGNOSIS — R42 Dizziness and giddiness: Secondary | ICD-10-CM | POA: Insufficient documentation

## 2023-09-10 LAB — URINALYSIS, ROUTINE W REFLEX MICROSCOPIC
Bilirubin Urine: NEGATIVE
Glucose, UA: NEGATIVE mg/dL
Hgb urine dipstick: NEGATIVE
Ketones, ur: NEGATIVE mg/dL
Leukocytes,Ua: NEGATIVE
Nitrite: NEGATIVE
Protein, ur: NEGATIVE mg/dL
Specific Gravity, Urine: 1.002 — ABNORMAL LOW (ref 1.005–1.030)
pH: 6 (ref 5.0–8.0)

## 2023-09-10 LAB — BASIC METABOLIC PANEL
Anion gap: 7 (ref 5–15)
BUN: 10 mg/dL (ref 6–20)
CO2: 22 mmol/L (ref 22–32)
Calcium: 9.4 mg/dL (ref 8.9–10.3)
Chloride: 109 mmol/L (ref 98–111)
Creatinine, Ser: 0.69 mg/dL (ref 0.44–1.00)
GFR, Estimated: >60 mL/min (ref >60)
Glucose, Bld: 128 mg/dL — ABNORMAL HIGH (ref 70–99)
Potassium: 4 mmol/L (ref 3.5–5.1)
Sodium: 138 mmol/L (ref 135–145)

## 2023-09-10 LAB — CBC
HCT: 38.3 % (ref 36.0–46.0)
Hemoglobin: 11.8 g/dL — ABNORMAL LOW (ref 12.0–15.0)
MCH: 27.1 pg (ref 26.0–34.0)
MCHC: 30.8 g/dL (ref 30.0–36.0)
MCV: 88 fL (ref 80.0–100.0)
Platelets: 245 10*3/uL (ref 150–400)
RBC: 4.35 MIL/uL (ref 3.87–5.11)
RDW: 13.7 % (ref 11.5–15.5)
WBC: 11.2 10*3/uL — ABNORMAL HIGH (ref 4.0–10.5)
nRBC: 0 % (ref 0.0–0.2)

## 2023-09-10 LAB — HCG, SERUM, QUALITATIVE: Preg, Serum: NEGATIVE

## 2023-09-10 LAB — CBG MONITORING, ED: Glucose-Capillary: 113 mg/dL — ABNORMAL HIGH (ref 70–99)

## 2023-09-10 MED ORDER — MECLIZINE HCL 25 MG PO TABS
25.0000 mg | ORAL_TABLET | Freq: Once | ORAL | Status: AC
Start: 2023-09-10 — End: 2023-09-10
  Administered 2023-09-10: 25 mg via ORAL
  Filled 2023-09-10: qty 1

## 2023-09-10 MED ORDER — MECLIZINE HCL 12.5 MG PO TABS: 12.5000 mg | ORAL_TABLET | Freq: Three times a day (TID) | ORAL | 0 refills | Status: AC

## 2023-09-10 NOTE — Discharge Instructions (Signed)
Please be sure to follow-up with your physician.  Take all medication as prescribed.  Return here for concerning changes in your condition.

## 2023-09-10 NOTE — ED Notes (Signed)
LKW 09/09/23 2200

## 2023-09-10 NOTE — ED Triage Notes (Signed)
When pt woke up she felt like room was spinning. Denies headache. Pt states room only spins when she stands up. Denies unilateral weakness or numbness. Pt was seen by ems this morning for  anxiety attack and is still feeling anxious.

## 2023-09-10 NOTE — ED Provider Notes (Signed)
[place] EMERGENCY DEPARTMENT AT [facility] Provider Note   CSN: [id number] Arrival date & time: 09/10/23  7829     History  Chief Complaint  Patient presents with   Headache   Dizziness    Michelle Christian is a 37 y.o. female.  HPI Patient presents with her husband who assists with the history.  She presents with concern for headache, dizziness.  Onset was today.  Headache was transient, is no longer present.  She notes that with sudden motion she felt room spinning sensation, nausea, but had no actual vomiting.  No chest pain, no sustained abdominal pain.  She was well prior to the event.  Patient notes that she takes 2 blood pressure medications 1 of which she takes regularly other, occasionally, as she has had episodes of hypotension as well.     Home Medications Prior to Admission medications   Medication Sig Start Date End Date Taking? Authorizing Provider  meclizine (ANTIVERT) 12.5 MG tablet Take 1 tablet (12.5 mg total) by mouth 3 (three) times daily for 7 days. 09/10/23 09/17/23 Yes Gerhard Munch, MD  amLODipine (NORVASC) 5 MG tablet Take 2 tablets (10 mg total) by mouth daily. 06/06/18 09/04/18  Arabella Merles, CNM  dextromethorphan-guaiFENesin (MUCINEX DM) 30-600 MG 12hr tablet Take 1 tablet by mouth 2 (two) times daily. 11/02/22   Ellsworth Lennox, PA-C  ferrous sulfate 325 (65 FE) MG EC tablet Take 1 tablet (325 mg total) by mouth 2 (two) times daily with a meal. 03/20/18   Degele, Kandra Nicolas, MD  norethindrone (MICRONOR) 0.35 MG tablet TAKE ONE TABLET BY MOUTH ONCE DAILY 05/27/19   Marylene Land, CNM  Prenatal Vit-Fe Fumarate-FA (PRENATAL 19) tablet Chew 1 tablet by mouth daily. 01/15/18   Reva Bores, MD      Allergies    Patient has no known allergies.    Review of Systems   Review of Systems  Physical Exam Updated Vital Signs BP (!) 130/93   Pulse 88   Temp 98.1 F (36.7 C)   Resp 17   Ht 5\' 5"  (1.651 m)   Wt 79.7 kg   LMP 08/17/2023    SpO2 100%   BMI 29.24 kg/m  Physical Exam Vitals and nursing note reviewed.  Constitutional:      General: She is not in acute distress.    Appearance: She is well-developed.  HENT:     Head: Normocephalic and atraumatic.  Eyes:     Conjunctiva/sclera: Conjunctivae normal.  Cardiovascular:     Rate and Rhythm: Normal rate and regular rhythm.  Pulmonary:     Effort: Pulmonary effort is normal. No respiratory distress.     Breath sounds: Normal breath sounds. No stridor.  Abdominal:     General: There is no distension.  Skin:    General: Skin is warm and dry.  Neurological:     Mental Status: She is alert and oriented to person, place, and time.     Cranial Nerves: No cranial nerve deficit, dysarthria or facial asymmetry.     Motor: No weakness.     Coordination: Coordination normal.  Psychiatric:        Mood and Affect: Mood normal.     ED Results / Procedures / Treatments   Labs (all labs ordered are listed, but only abnormal results are displayed) Labs Reviewed  BASIC METABOLIC PANEL - Abnormal; Notable for the following components:      Result Value   Glucose, Bld 128 (*)  All other components within normal limits  CBC - Abnormal; Notable for the following components:   WBC 11.2 (*)    Hemoglobin 11.8 (*)    All other components within normal limits  URINALYSIS, ROUTINE W REFLEX MICROSCOPIC - Abnormal; Notable for the following components:   Color, Urine STRAW (*)    Specific Gravity, Urine 1.002 (*)    All other components within normal limits  CBG MONITORING, ED - Abnormal; Notable for the following components:   Glucose-Capillary 113 (*)    All other components within normal limits  HCG, SERUM, QUALITATIVE    EKG EKG Interpretation Date/Time:  Wednesday September 10 2023 08:42:45 EST Ventricular Rate:  91 PR Interval:  120 QRS Duration:  68 QT Interval:  354 QTC Calculation: 435 R Axis:   50  Text Interpretation: Normal sinus rhythm Normal ECG  When compared with ECG of 25-Mar-2013 08:48, PREVIOUS ECG IS PRESENT Confirmed by Gerhard Munch [phone]) on 09/10/2023 1:06:49 PM  Radiology CT Head Wo Contrast  Result Date: 09/10/2023 CLINICAL DATA:  Headache, sudden, severe EXAM: CT HEAD WITHOUT CONTRAST TECHNIQUE: Contiguous axial images were obtained from the base of the skull through the vertex without intravenous contrast. RADIATION DOSE REDUCTION: This exam was performed according to the departmental dose-optimization program which includes automated exposure control, adjustment of the mA and/or kV according to patient size and/or use of iterative reconstruction technique. COMPARISON:  None Available. FINDINGS: Brain: No hemorrhage. No hydrocephalus. No extra-axial fluid collection. No CT evidence of an acute cortical infarct. No mass effect. No mass lesion. Vascular: No hyperdense vessel or unexpected calcification. Skull: Normal. Negative for fracture or focal lesion. Sinuses/Orbits: No middle ear or mastoid effusion. Mild mucosal thickening of the floor of bilateral maxillary sinuses. Orbits are unremarkable. Other: None IMPRESSION: No acute intracranial abnormality. No specific etiology for headaches identified. Electronically Signed   By: Lorenza Cambridge M.D.   On: 09/10/2023 12:30    Procedures Procedures    Medications Ordered in ED Medications  meclizine (ANTIVERT) tablet 25 mg (has no administration in time range)    ED Course/ Medical Decision Making/ A&P                                 Medical Decision Making Adult female presents with dizziness, nausea, headache initially, which is atypical for her.  Patient has hypertension, and some suspicion for hypertensive urgency given her requirement for 2 medications, headache, dizziness.  Other considerations include vertigo, mass.  Cardiac 90 sinus normal Pulse ox 100% room air normal   Amount and/or Complexity of Data Reviewed Independent Historian: spouse Labs: ordered.  Decision-making details documented in ED Course. Radiology: ordered and independent interpretation performed. Decision-making details documented in ED Course. ECG/medicine tests: ordered and independent interpretation performed. Decision-making details documented in ED Course.   1:08 PM Patient in no distress, awake, alert.  No new complaints.  I reviewed all findings, discussed the CT which have reviewed as well.  No evidence for mass, hemorrhage, blood pressure now improved.  We discussed importance of following up with primary care, patient will start meclizine for suspicion for vertigo versus hypertensive urgency.  Absent evidence for endorgan damage, patient appropriate for discharge.        Final Clinical Impression(s) / ED Diagnoses Final diagnoses:  Dizziness  Bad headache    Rx / DC Orders ED Discharge Orders          Ordered  meclizine (ANTIVERT) 12.5 MG tablet  3 times daily        09/10/23 1308              Gerhard Munch, MD 09/10/23 1308

## 2024-04-11 ENCOUNTER — Encounter (HOSPITAL_COMMUNITY): Payer: Self-pay | Admitting: Emergency Medicine

## 2024-04-11 ENCOUNTER — Ambulatory Visit (HOSPITAL_COMMUNITY)
Admission: EM | Admit: 2024-04-11 | Discharge: 2024-04-11 | Disposition: A | Discharge status: Home / Self Care | Payer: Self-pay

## 2024-04-11 ENCOUNTER — Other Ambulatory Visit: Payer: Self-pay

## 2024-04-11 DIAGNOSIS — F419 Anxiety disorder, unspecified: Secondary | ICD-10-CM

## 2024-04-11 MED ORDER — HYDROXYZINE HCL 10 MG PO TABS: 10.0000 mg | ORAL_TABLET | Freq: Three times a day (TID) | ORAL | 2 refills | Status: AC | PRN

## 2024-04-11 NOTE — ED Provider Notes (Signed)
 [facility] [place]  Note:  This document was prepared using Dragon voice recognition software and may include unintentional dictation errors.  MRN: [id number] DOB: 07/08/1986  Subjective:   Michelle Christian is a 38 y.o. female presenting for severe anxiety and depression related to increased fear and stress surrounding current immigration issues in the country.  Patient reports that she has previously been diagnosed with anxiety depression and was previously taking medication but has not seen anybody in several years for symptoms of depression and anxiety.  Patient denies any shortness of breath, wheezing, dizziness, weakness.  Patient states that she occasionally has midsternal chest pain but no chest pain at present.  Patient denies any suicidal or homicidal ideation.  No current facility-administered medications for this encounter.  Current Outpatient Medications:    hydrOXYzine (ATARAX) 10 MG tablet, Take 1 tablet (10 mg total) by mouth 3 (three) times daily as needed for anxiety., Disp: 30 tablet, Rfl: 2   amLODipine  (NORVASC ) 5 MG tablet, Take 2 tablets (10 mg total) by mouth daily., Disp: 30 tablet, Rfl: 2   dextromethorphan-guaiFENesin  (MUCINEX  DM) 30-600 MG 12hr tablet, Take 1 tablet by mouth 2 (two) times daily., Disp: 20 tablet, Rfl: 0   ferrous sulfate  325 (65 FE) MG EC tablet, Take 1 tablet (325 mg total) by mouth 2 (two) times daily with a meal., Disp: 60 tablet, Rfl: 3   norethindrone  (MICRONOR ) 0.35 MG tablet, TAKE ONE TABLET BY MOUTH ONCE DAILY, Disp: 28 tablet, Rfl: 11   Prenatal Vit-Fe Fumarate-FA (PRENATAL 19) tablet, Chew 1 tablet by mouth daily., Disp: 90 tablet, Rfl: 2   No Known Allergies  Past Medical History:  Diagnosis Date   Abnormal LFTs (liver function tests) 10/02/2011   09/30/2011-70/122, then normal 18/19 on 11/18/11    Depression 01/2013   Gestational diabetes    Hemorrhoids during pregnancy    History of postpartum depression, currently pregnant     Hypertension      Past Surgical History:  Procedure Laterality Date   NO PAST SURGERIES      Family History  Problem Relation Age of Onset   Hypertension Maternal Grandmother    Hypertension Maternal Grandfather    Anesthesia problems Neg Hx    Hypotension Neg Hx    Malignant hyperthermia Neg Hx    Pseudochol deficiency Neg Hx     Social History   Tobacco Use   Smoking status: Never   Smokeless tobacco: Never  Substance Use Topics   Alcohol use: No   Drug use: No    ROS Refer to HPI for ROS details.  Objective:   Vitals: BP (!) 160/90 (BP Location: Right Arm)   Pulse 100   Temp 98 F (36.7 C) (Oral)   Resp 18   LMP 04/05/2024 (Exact Date)   SpO2 98%   Physical Exam Vitals and nursing note reviewed.  Constitutional:      General: She is not in acute distress.    Appearance: Normal appearance. She is well-developed. She is not ill-appearing or toxic-appearing.  HENT:     Head: Normocephalic and atraumatic.     Mouth/Throat:     Mouth: Mucous membranes are moist.   Eyes:     General:        Right eye: No discharge.        Left eye: No discharge.     Extraocular Movements: Extraocular movements intact.     Conjunctiva/sclera: Conjunctivae normal.    Cardiovascular:     Rate and Rhythm:  Normal rate and regular rhythm.     Heart sounds: Normal heart sounds. No murmur heard. Pulmonary:     Effort: Pulmonary effort is normal. No respiratory distress.     Breath sounds: No stridor. No wheezing.   Musculoskeletal:        General: Normal range of motion.   Skin:    General: Skin is warm and dry.   Neurological:     General: No focal deficit present.     Mental Status: She is alert and oriented to person, place, and time.   Psychiatric:        Mood and Affect: Mood is anxious. Affect is tearful.        Speech: Speech normal.        Behavior: Behavior normal. Behavior is cooperative.        Cognition and Memory: Cognition normal.      Procedures  No results found for this or any previous visit (from the past 24 hours).  No results found.   Assessment and Plan :     Discharge Instructions       1. Anxiety (Primary) - ED EKG performed in UC shows normal sinus rhythm with a ventricular rate of 97 bpm, borderline/normal EKG.  No STEMI. - hydrOXYzine (ATARAX) 10 MG tablet; Take 1 tablet (10 mg total) by mouth 3 (three) times daily as needed for anxiety.  - Follow-up with [facility] behavioral health urgent care, information provided in AVS.  Please follow-up with [facility] health for further evaluation and management of ongoing anxiety and depression.      Dinisha Cai B Bailyn Spackman   Konstance Happel, [place], [place] 04/11/24 1528

## 2024-04-11 NOTE — Discharge Instructions (Signed)
  1. Anxiety (Primary) - ED EKG performed in UC shows normal sinus rhythm with a ventricular rate of 97 bpm, borderline/normal EKG.  No STEMI. - hydrOXYzine (ATARAX) 10 MG tablet; Take 1 tablet (10 mg total) by mouth 3 (three) times daily as needed for anxiety.  - Follow-up with [facility] behavioral health urgent care, information provided in AVS.  Please follow-up with [facility] health for further evaluation and management of ongoing anxiety and depression.

## 2024-04-11 NOTE — ED Triage Notes (Signed)
 Spanish speaker pt states she is very anxious and depress, feeling lightheaded.

## 2024-07-29 ENCOUNTER — Other Ambulatory Visit (HOSPITAL_BASED_OUTPATIENT_CLINIC_OR_DEPARTMENT_OTHER): Payer: Self-pay

## 2024-11-09 ENCOUNTER — Other Ambulatory Visit: Payer: Self-pay | Admitting: Obstetrics & Gynecology

## 2024-11-09 DIAGNOSIS — Z1231 Encounter for screening mammogram for malignant neoplasm of breast: Secondary | ICD-10-CM
